# Patient Record
Sex: Female | Born: 1980 | Race: White | Hispanic: No | Smoking: Former smoker
Health system: Southern US, Community
[De-identification: ages and names within clinical notes are randomized; demographics above are authoritative.]

## PROBLEM LIST (undated history)

## (undated) DIAGNOSIS — T7840XA Allergy, unspecified, initial encounter: Secondary | ICD-10-CM

## (undated) DIAGNOSIS — F419 Anxiety disorder, unspecified: Secondary | ICD-10-CM

## (undated) DIAGNOSIS — E039 Hypothyroidism, unspecified: Secondary | ICD-10-CM

## (undated) HISTORY — DX: Allergy, unspecified, initial encounter: T78.40XA

## (undated) HISTORY — DX: Hypothyroidism, unspecified: E03.9

## (undated) HISTORY — PX: CHOLECYSTECTOMY: SHX55

---
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
## 2004-11-01 ENCOUNTER — Inpatient Hospital Stay: Payer: Self-pay

## 2005-08-21 ENCOUNTER — Ambulatory Visit: Payer: Self-pay | Admitting: Surgery

## 2008-07-31 ENCOUNTER — Ambulatory Visit: Payer: Self-pay | Admitting: General Practice

## 2013-09-04 DIAGNOSIS — C4491 Basal cell carcinoma of skin, unspecified: Secondary | ICD-10-CM

## 2013-09-04 HISTORY — DX: Basal cell carcinoma of skin, unspecified: C44.91

## 2014-11-16 DIAGNOSIS — R7989 Other specified abnormal findings of blood chemistry: Secondary | ICD-10-CM | POA: Insufficient documentation

## 2015-06-05 DIAGNOSIS — D229 Melanocytic nevi, unspecified: Secondary | ICD-10-CM

## 2015-06-05 HISTORY — DX: Melanocytic nevi, unspecified: D22.9

## 2015-08-04 HISTORY — PX: AUGMENTATION MAMMAPLASTY: SUR837

## 2016-10-15 ENCOUNTER — Ambulatory Visit: Payer: Self-pay | Admitting: Medical

## 2016-10-15 ENCOUNTER — Encounter: Payer: Self-pay | Admitting: Medical

## 2016-10-15 VITALS — BP 106/72 | HR 88 | Temp 99.7°F | Resp 16 | Ht 59.0 in | Wt 109.0 lb

## 2016-10-15 DIAGNOSIS — J0110 Acute frontal sinusitis, unspecified: Secondary | ICD-10-CM

## 2016-10-15 MED ORDER — AMOXICILLIN-POT CLAVULANATE 875-125 MG PO TABS: 1.0000 | ORAL_TABLET | Freq: Two times a day (BID) | ORAL | 0 refills | Status: DC

## 2016-10-15 NOTE — Patient Instructions (Addendum)
Rest , increase fluids. otc advil take as directed for fever or pain. Return in 3-5 days if not improving. Return for appointment on sleeping trouble and  Smoking cessation counseling.

## 2016-10-15 NOTE — Progress Notes (Signed)
   Subjective:    Patient ID: Holly Pineda, female    DOB: Apr 20, 1981, 36 y.o.   MRN: [id number]  HPI   Started Saturday with headache on forehead, pressure in maxillary area. Fever of  100. 3. Took no antipyrectis today.     Review of Systems  Constitutional: Positive for fatigue and fever. Negative for chills.  HENT: Positive for congestion, ear pain, postnasal drip, sinus pain, sinus pressure, sneezing and sore throat. Negative for nosebleeds and rhinorrhea.   Respiratory: Negative for shortness of breath.   Cardiovascular: Negative for chest pain.       Objective:   Physical Exam  Constitutional: She is oriented to person, place, and time. She appears well-developed and well-nourished.  HENT:  Head: Normocephalic and atraumatic.  Right Ear: A middle ear effusion is present.  Nose: Mucosal edema present.  Mouth/Throat: Uvula is midline, oropharynx is clear and moist and mucous membranes are normal.  Eyes: EOM are normal. Pupils are equal, round, and reactive to light.  Neck: Normal range of motion. Neck supple.  Cardiovascular: Normal rate and regular rhythm.  Exam reveals no gallop and no friction rub.   No murmur heard. Pulmonary/Chest: Effort normal and breath sounds normal.  Musculoskeletal: Normal range of motion.  Lymphadenopathy:    She has no cervical adenopathy.  Neurological: She is alert and oriented to person, place, and time.  Psychiatric: She has a normal mood and affect. Her behavior is normal.  nares erythema bilaterally, green discharge noted on left side.        Assessment & Plan:  Sinusitis Augmentin 875mg  one twice daily x  10 days take with food. otc zyrtec or claritin take as directed. otc advil as needed for pain or fever take as directed. Return to the  3-5 days if not improving. Return appointment for sleeping trouble and smoking cessation counseling. May try otc melatonin 1g to  2 g per night to help with sleeping.

## 2016-10-29 ENCOUNTER — Ambulatory Visit: Payer: Self-pay | Admitting: Medical

## 2016-10-29 ENCOUNTER — Encounter: Payer: Self-pay | Admitting: Medical

## 2016-10-29 VITALS — BP 100/70 | HR 85 | Temp 99.8°F | Resp 16 | Wt 108.0 lb

## 2016-10-29 DIAGNOSIS — J0111 Acute recurrent frontal sinusitis: Secondary | ICD-10-CM

## 2016-10-29 MED ORDER — LEVOFLOXACIN 500 MG PO TABS: 500.0000 mg | ORAL_TABLET | Freq: Every day | ORAL | 0 refills | Status: AC

## 2016-10-29 NOTE — Progress Notes (Signed)
   Subjective:    Patient ID: Holly Pineda, female    DOB: 05/04/1981, 36 y.o.   MRN: [id number]  HPI Started 2 days ago with nasal congestion and forehead headache. Just finished Augmentin  On Monday,  Feels like sinusitis came back. Denies fever. Collecting green mucus in throat.  Initially felt better at the 8-10 day on antibiotics. After finishing antibiotics she began to have symptoms on Tuesday.    Review of Systems  Constitutional: Negative for chills and fever.  HENT: Positive for congestion, sinus pain, sinus pressure, sneezing and sore throat. Negative for ear pain, hearing loss and rhinorrhea.   Eyes: Negative.   Respiratory: Negative.   Cardiovascular: Negative.   Gastrointestinal: Negative.   Endocrine: Negative.   Genitourinary: Negative.   Musculoskeletal: Negative.   Allergic/Immunologic: Negative.   Neurological: Negative.   Hematological: Negative.   Psychiatric/Behavioral: Negative.        Objective:   Physical Exam  Constitutional: She appears well-developed and well-nourished.  HENT:  Head: Normocephalic and atraumatic.  Right Ear: A middle ear effusion is present.  Left Ear: A middle ear effusion is present.  Nose: Mucosal edema present.  Mouth/Throat: Uvula is midline, oropharynx is clear and moist and mucous membranes are normal.  Eyes: EOM are normal. Pupils are equal, round, and reactive to light.  Neck: Normal range of motion. Neck supple.  Cardiovascular: Normal rate, regular rhythm and normal heart sounds.  Exam reveals no gallop and no friction rub.   No murmur heard. Pulmonary/Chest: Effort normal and breath sounds normal.  Nursing note and vitals reviewed.  Nares- swelling of turbinates bilaterally and erythema.       Assessment & Plan:  Sinusitis- levaquin 500mg  one tablet by mouth once a day for ten days no refills. Return to clinic in 12 days for recheck. Recommended otc zyrtec daily and otc flonase daily. Increase water intake.   Take otc Advil 200-4000mg  every 6 hours as needed.  Patient currently on mensis.

## 2016-11-24 ENCOUNTER — Encounter: Payer: Self-pay | Admitting: Medical

## 2016-11-24 ENCOUNTER — Ambulatory Visit: Payer: Self-pay | Admitting: Medical

## 2016-11-24 VITALS — BP 120/65 | HR 102 | Temp 99.1°F | Resp 16 | Ht 59.0 in | Wt 108.0 lb

## 2016-11-24 DIAGNOSIS — G4700 Insomnia, unspecified: Secondary | ICD-10-CM

## 2016-11-24 NOTE — Progress Notes (Signed)
Subjective:    Patient ID: Holly Pineda, female    DOB: 1980-11-29, 36 y.o.   MRN: [id number]  HPI   36 yo female came into today to be checked for her sinuses, wanted to make sure she does not have an infection still. Finished all the Levaquin. "And I feel better. "  Still has post nasal drip "just a little"  Also tried Melatonin started 1 gram and it kept her up , then did  2 mg and still no sleeping.  Says medications work the opposite on her Coffee makes her sleepy and melatonin at  2 mg without  sleepiness. Tried Xanax (given to her by her mother 0.'5mg'$ ) which made her sleepy but then woke her up in the middle of the night and she could not get back to sleep.  Tachycardic in clinic, denies light-headed, dizziness or chest pain or feeling like her heart is racing.Marland Kitchen "I feel fine". ( rechecked hr 110 ir room) she did have two cups of coffee and is a smoker. Patient a little stressed/anxious when discussing about her relationship and HIV testing.     Review of Systems  Constitutional: Negative for chills and fever.  HENT: Negative for postnasal drip.   Eyes: Negative for discharge and itching.  Respiratory: Positive for cough. Negative for shortness of breath.   Cardiovascular: Negative for chest pain, palpitations and leg swelling.  Gastrointestinal: Negative for diarrhea, nausea and vomiting.  Endocrine: Negative for cold intolerance and heat intolerance.  Genitourinary: Negative for dysuria, frequency and urgency.  Musculoskeletal: Negative for back pain and joint swelling.  Skin: Negative for rash and wound.  Neurological: Negative for dizziness, seizures and light-headedness.  Hematological: Negative for adenopathy.  Psychiatric/Behavioral: Negative for confusion and hallucinations.  Cough she says is from post nasl drip. She has not yet tried the zyrtec. Because she is afraid if takes it it may have an opposite affect on her, making her sleepy.  I discussed with her she could  take at night.      Objective:   Physical Exam  Constitutional: She appears well-developed and well-nourished.  HENT:  Head: Normocephalic and atraumatic.  Right Ear: Hearing, tympanic membrane, external ear and ear canal normal.  Left Ear: Hearing, tympanic membrane, external ear and ear canal normal.  Nose: Nose normal.  Mouth/Throat: Uvula is midline, oropharynx is clear and moist and mucous membranes are normal.  Eyes: EOM are normal. Pupils are equal, round, and reactive to light.  Neck: Normal range of motion. Neck supple.  Cardiovascular: Regular rhythm and normal heart sounds.  Tachycardia present.   Lymphadenopathy:    She has no cervical adenopathy.            Assessment & Plan:   Sinusitis resolved. Has appointment for labs on May 2nd for primary care labs (TSH will be checked. Including . CBC/diff, Met C , Uric Acid, and Vitamin D level). She would like HIV and Syphillis and GC/Chlamydia testing done the end of May ( she has her own personal reasons) She is trying to work things out with her partner of  20 years , they are not living together and she is using protection. We discussed that she may say she has a personal issue over the phone because she does not want to call in and  ask for STI testing on the phone. She seems a little uncomfortable sharing this information.( she does have a postivie hx for  HSV infection genital herpes). She is  going to try otc Zyrtec (it may make her sleepy) since many medications work opposite on her.  If she does not try this she may try otc flonase to help with her post nasal drip. And she may also try the  Melatonin at a higher dosage. 89m to 5 mg. I offered her a prescription of sleep medication, but she would like to try the melatonin first.  Tachycardica, she is to contact me or come see me if she has chest pain, dizziness, lightheadedness or palpatations. She is coming in on Wednesday May 2nd for lab work and we will check her vitals  then as well.

## 2016-11-30 ENCOUNTER — Other Ambulatory Visit: Payer: Self-pay

## 2016-12-02 ENCOUNTER — Other Ambulatory Visit: Payer: Self-pay | Admitting: *Deleted

## 2016-12-02 DIAGNOSIS — Z0000 Encounter for general adult medical examination without abnormal findings: Secondary | ICD-10-CM

## 2016-12-02 LAB — POCT URINALYSIS DIPSTICK
Bilirubin, UA: NEGATIVE
Glucose, UA: NEGATIVE
Ketones, UA: NEGATIVE
Leukocytes, UA: NEGATIVE
NITRITE UA: NEGATIVE
PH UA: 6 (ref 5.0–8.0)
PROTEIN UA: NEGATIVE
RBC UA: NEGATIVE
SPEC GRAV UA: 1.02 (ref 1.010–1.025)
UROBILINOGEN UA: 0.2 U/dL

## 2016-12-03 LAB — CMP12+LP+TP+TSH+6AC+CBC/D/PLT
ALK PHOS: 46 IU/L (ref 39–117)
ALT: 12 IU/L (ref 0–32)
AST: 14 IU/L (ref 0–40)
Albumin/Globulin Ratio: 2.1 (ref 1.2–2.2)
Albumin: 4.6 g/dL (ref 3.5–5.5)
BASOS: 1 %
BILIRUBIN TOTAL: 0.2 mg/dL (ref 0.0–1.2)
BUN/Creatinine Ratio: 22 (ref 9–23)
BUN: 15 mg/dL (ref 6–20)
Basophils Absolute: 0 10*3/uL (ref 0.0–0.2)
CHLORIDE: 103 mmol/L (ref 96–106)
CHOL/HDL RATIO: 3.3 ratio (ref 0.0–4.4)
CREATININE: 0.68 mg/dL (ref 0.57–1.00)
Calcium: 9.5 mg/dL (ref 8.7–10.2)
Cholesterol, Total: 194 mg/dL (ref 100–199)
EOS (ABSOLUTE): 0.2 10*3/uL (ref 0.0–0.4)
EOS: 4 %
Free Thyroxine Index: 1.9 (ref 1.2–4.9)
GFR, EST AFRICAN AMERICAN: 129 mL/min/{1.73_m2} (ref >59)
GFR, EST NON AFRICAN AMERICAN: 112 mL/min/{1.73_m2} (ref >59)
GGT: 9 IU/L (ref 0–60)
GLUCOSE: 89 mg/dL (ref 65–99)
Globulin, Total: 2.2 g/dL (ref 1.5–4.5)
HDL: 59 mg/dL (ref >39)
HEMATOCRIT: 39.7 % (ref 34.0–46.6)
HEMOGLOBIN: 13.1 g/dL (ref 11.1–15.9)
IMMATURE GRANULOCYTES: 0 %
Immature Grans (Abs): 0 10*3/uL (ref 0.0–0.1)
Iron: 73 ug/dL (ref 27–159)
LDH: 144 IU/L (ref 119–226)
LDL CALC: 122 mg/dL — AB (ref 0–99)
LYMPHS ABS: 2.1 10*3/uL (ref 0.7–3.1)
Lymphs: 34 %
MCH: 29.2 pg (ref 26.6–33.0)
MCHC: 33 g/dL (ref 31.5–35.7)
MCV: 89 fL (ref 79–97)
Monocytes Absolute: 0.4 10*3/uL (ref 0.1–0.9)
Monocytes: 6 %
Neutrophils Absolute: 3.5 10*3/uL (ref 1.4–7.0)
Neutrophils: 55 %
POTASSIUM: 4.4 mmol/L (ref 3.5–5.2)
Phosphorus: 4.2 mg/dL (ref 2.5–4.5)
Platelets: 245 10*3/uL (ref 150–379)
RBC: 4.48 x10E6/uL (ref 3.77–5.28)
RDW: 13.1 % (ref 12.3–15.4)
Sodium: 141 mmol/L (ref 134–144)
T3 Uptake Ratio: 27 % (ref 24–39)
T4, Total: 7 ug/dL (ref 4.5–12.0)
TOTAL PROTEIN: 6.8 g/dL (ref 6.0–8.5)
TSH: 3.35 u[IU]/mL (ref 0.450–4.500)
Triglycerides: 66 mg/dL (ref 0–149)
URIC ACID: 3.7 mg/dL (ref 2.5–7.1)
VLDL CHOLESTEROL CAL: 13 mg/dL (ref 5–40)
WBC: 6.2 10*3/uL (ref 3.4–10.8)

## 2016-12-03 LAB — VITAMIN D 25 HYDROXY (VIT D DEFICIENCY, FRACTURES): VIT D 25 HYDROXY: 27.5 ng/mL — AB (ref 30.0–100.0)

## 2016-12-08 ENCOUNTER — Ambulatory Visit: Payer: Self-pay | Admitting: Medical

## 2016-12-08 ENCOUNTER — Encounter: Payer: Self-pay | Admitting: Medical

## 2016-12-08 VITALS — BP 110/75 | HR 94 | Temp 98.8°F | Resp 16 | Ht 59.0 in | Wt 107.0 lb

## 2016-12-08 DIAGNOSIS — E559 Vitamin D deficiency, unspecified: Secondary | ICD-10-CM

## 2016-12-08 DIAGNOSIS — Z0000 Encounter for general adult medical examination without abnormal findings: Secondary | ICD-10-CM

## 2016-12-08 NOTE — Progress Notes (Signed)
Subjective:    Patient ID: Holly Pineda, female    DOB: May 24, 1981, 36 y.o.   MRN: [id number]  HPI  36 yo  Here for primary care appointment.  Works in [place] and is married with  2 children Girl  At  54 yo  , boy 72 yo. She has no complaints today.  She sees her OB/GYN yearly for annual female exams. Both her parents are healthy.       Review of Systems  Constitutional: Negative.   HENT: Positive for sinus pain.   Eyes: Negative.   Respiratory: Negative.   Cardiovascular: Negative.   Gastrointestinal: Negative.   Endocrine: Negative.   Genitourinary: Negative.   Musculoskeletal: Negative.   Skin: Negative.   Allergic/Immunologic: Positive for environmental allergies. Negative for food allergies.  Neurological: Positive for headaches.  Hematological: Negative.   Psychiatric/Behavioral: Positive for sleep disturbance.   Neck pain right sided x2 days , thinks she slept wrong and gave her a headache back  of head took nothing for pain. Going to take iubprofen within an hour. Took some yesterday with some relief. Trying melatonin for her sleeping trouble, have reviewed sleep hygiene with patient. No Headache today.    Objective:   Physical Exam  Constitutional: She is oriented to person, place, and time. She appears well-developed and well-nourished.  HENT:  Head: Normocephalic and atraumatic.  Right Ear: Hearing, tympanic membrane, external ear and ear canal normal.  Left Ear: Hearing, tympanic membrane, external ear and ear canal normal.  Nose: Nose normal.  Mouth/Throat: Uvula is midline, oropharynx is clear and moist and mucous membranes are normal.  Eyes: Conjunctivae, EOM and lids are normal. Pupils are equal, round, and reactive to light. No scleral icterus.  Fundoscopic exam:      The right eye shows red reflex.       The left eye shows red reflex.  Neck: Normal range of motion. Neck supple. No thyromegaly present.  Cardiovascular: Normal rate, regular rhythm,  normal heart sounds and intact distal pulses.  Exam reveals no gallop and no friction rub.   No murmur heard. Pulmonary/Chest: Effort normal and breath sounds normal.  Abdominal: Soft. Bowel sounds are normal. She exhibits no mass. There is no hepatosplenomegaly. There is no tenderness.  Musculoskeletal: Normal range of motion.  Lymphadenopathy:       Head (right side): No submental, no submandibular, no tonsillar, no preauricular, no posterior auricular and no occipital adenopathy present.       Head (left side): No submental, no submandibular, no tonsillar, no preauricular, no posterior auricular and no occipital adenopathy present.       Right: No supraclavicular adenopathy present.       Left: No supraclavicular adenopathy present.  Neurological: She is alert and oriented to person, place, and time. She has normal strength and normal reflexes. No cranial nerve deficit or sensory deficit. She displays a negative Romberg sign. GCS eye subscore is 4. GCS verbal subscore is 5. GCS motor subscore is 6.  Reflex Scores:      Brachioradialis reflexes are 2+ on the right side and 2+ on the left side.      Patellar reflexes are 2+ on the right side and 2+ on the left side.      Achilles reflexes are 2+ on the right side and 2+ on the left side. Skin: Skin is warm, dry and intact. No cyanosis. Nails show no clubbing.  Psychiatric: She has a normal mood and affect. Her speech  is normal and behavior is normal. Judgment and thought content normal. Cognition and memory are normal.  Nursing note and vitals reviewed.   Declined breast exam. Has her own OB/GYN that she sees annually.      Assessment & Plan:  Reviewed labs with patient  LDL elevated 122 . Offered  Dietitian but she declined. Vitamin D deficiency to take  Vitamin D3 4000 IU/day with a recheck in one year. For sleeping trouble she prefers to try the melatonin before trying a prescription.  Recommended exercise  3 x / week for at least 30  minutes.  Return to the clinic as needed.

## 2017-03-04 ENCOUNTER — Ambulatory Visit: Payer: Self-pay | Admitting: Adult Health

## 2017-03-04 ENCOUNTER — Encounter: Payer: Self-pay | Admitting: Adult Health

## 2017-03-04 VITALS — BP 102/78 | HR 107 | Temp 98.7°F | Wt 103.0 lb

## 2017-03-04 DIAGNOSIS — Z720 Tobacco use: Secondary | ICD-10-CM

## 2017-03-04 DIAGNOSIS — J0111 Acute recurrent frontal sinusitis: Secondary | ICD-10-CM

## 2017-03-04 DIAGNOSIS — F17200 Nicotine dependence, unspecified, uncomplicated: Secondary | ICD-10-CM

## 2017-03-04 DIAGNOSIS — Z716 Tobacco abuse counseling: Secondary | ICD-10-CM

## 2017-03-04 MED ORDER — AMOXICILLIN-POT CLAVULANATE 875-125 MG PO TABS: 1.0000 | ORAL_TABLET | Freq: Two times a day (BID) | ORAL | 0 refills | Status: AC

## 2017-03-04 NOTE — Patient Instructions (Addendum)
Pseudoephedrine tablets What is this medicine? PSEUDOEPHEDRINE (soo doe e FED rin) is a decongestant. It is used to treat congestion of the nose or sinuses. This medicine may be used for other purposes; ask your health care provider or pharmacist if you have questions. COMMON BRAND NAME(S): Contac Cold 12 Hour, Genaphed, NASAL Decongestant, Nexafed, Pseudo-Time, Sudafed, Sudafed Congestion, Sudogest, Zephrex-D What should I tell my health care provider before I take this medicine? They need to know if you have any of the following conditions: -diabetes -glaucoma -heart disease -high blood pressure -kidney disease -prostate trouble -taken an MAOI like Carbex, Eldepryl, Marplan, Nardil, or Parnate in last 14 days -thyroid disease -trouble passing urine -an unusual or allergic reaction to pseudoephedrine, other medicines, foods, dyes, or preservatives -pregnant or trying to get pregnant -breast-feeding How should I use this medicine? Take this medicine by mouth with a glass of water. Follow the directions on the package or prescription label. Take your medicine at regular intervals. Do not take your medicine more often than directed. Talk to your pediatrician regarding the use of this medicine in children. While this drug may be prescribed for children as young as 38 years of age for selected conditions, precautions do apply. Patients over 37 years old may have a stronger reaction and need a smaller dose. Overdosage: If you think you have taken too much of this medicine contact a poison control center or emergency room at once. NOTE: This medicine is only for you. Do not share this medicine with others. What if I miss a dose? If you miss a dose, take it as soon as you can. If it is almost time for your next dose, take only that dose. Do not take double or extra doses. What may interact with this medicine? Do not take this medicine with any of the following medications: -bromocriptine -ergot  alkaloids like dihydroergotamine, ergonovine, ergotamine, methylergonovine -MAOIs like Carbex, Eldepryl, Marplan, Nardil, and Parnate -stimulant medicines for attention disorders, weight loss, or to stay awake This medicine may also interact with the following medications: -alcohol -atropine -bretylium -caffeine -digoxin -linezolid -mecamylamine -medicines for blood pressure -medicines for depression, anxiety, or psychotic disturbances like fluoxetine, sertraline -medicines for enlarged prostate -medicines for sleep -other medicines for cold, cough, or allergy -procarbazine -reserpine -some heart medicines like metoprolol -St. John's Wort This list may not describe all possible interactions. Give your health care provider a list of all the medicines, herbs, non-prescription drugs, or dietary supplements you use. Also tell them if you smoke, drink alcohol, or use illegal drugs. Some items may interact with your medicine. What should I watch for while using this medicine? Tell your doctor or healthcare professional if your symptoms do not start to get better or if they get worse. See your doctor if you are not better in 7 days or if you have a fever. What side effects may I notice from receiving this medicine? Side effects that you should report to your doctor or health care professional as soon as possible: -allergic reactions like skin rash, itching or hives, swelling of the face, lips, or tongue -bloody diarrhea with stomach pain -breathing problems -chest pain -confused, agitated, nervous -fast, irregular heartbeat -feeling faint or lightheaded, falls -hallucinations -high blood pressure -pain, tingling, numbness in the hands or feet -trouble passing urine or change in the amount of urine -trouble sleeping Side effects that usually do not require medical attention (report to your doctor or health care professional if they continue or are bothersome): -headache -  loss of  appetite -nausea, stomach upset This list may not describe all possible side effects. Call your doctor for medical advice about side effects. You may report side effects to FDA at 1-800-FDA-1088. Where should I keep my medicine? Keep out of the reach of children. This medicine may cause accidental overdose and death if taken by other adults, children, or pets. Mix any unused medicine with a substance like cat littler or coffee grounds. Then throw the medicine away in a sealed container like a sealed bag or a coffee can with a lid. Do not use the medicine after the expiration date. Store at room temperature between 15 and 25 degrees C (59 and 77 degrees F). Protect from heat and moisture. NOTE: This sheet is a summary. It may not cover all possible information. If you have questions about this medicine, talk to your doctor, pharmacist, or health care provider.  2018 Elsevier/Gold Standard (2014-03-24 19:28:08) Sinusitis, Adult Sinusitis is soreness and inflammation of your sinuses. Sinuses are hollow spaces in the bones around your face. They are located: Around your eyes. In the middle of your forehead. Behind your nose. In your cheekbones.  Your sinuses and nasal passages are lined with a stringy fluid (mucus). Mucus normally drains out of your sinuses. When your nasal tissues get inflamed or swollen, the mucus can get trapped or blocked so air cannot flow through your sinuses. This lets bacteria, viruses, and funguses grow, and that leads to infection. Follow these instructions at home: Medicines Take, use, or apply over-the-counter and prescription medicines only as told by your doctor. These may include nasal sprays. If you were prescribed an antibiotic medicine, take it as told by your doctor. Do not stop taking the antibiotic even if you start to feel better. Hydrate and Humidify Drink enough water to keep your pee (urine) clear or pale yellow. Use a cool mist humidifier to keep the  humidity level in your home above 50%. Breathe in steam for 10-15 minutes, 3-4 times a day or as told by your doctor. You can do this in the bathroom while a hot shower is running. Try not to spend time in cool or dry air. Rest Rest as much as possible. Sleep with your head raised (elevated). Make sure to get enough sleep each night. General instructions Put a warm, moist washcloth on your face 3-4 times a day or as told by your doctor. This will help with discomfort. Wash your hands often with soap and water. If there is no soap and water, use hand sanitizer. Do not smoke. Avoid being around people who are smoking (secondhand smoke). Keep all follow-up visits as told by your doctor. This is important. Contact a doctor if: You have a fever. Your symptoms get worse. Your symptoms do not get better within 10 days. Get help right away if: You have a very bad headache. You cannot stop throwing up (vomiting). You have pain or swelling around your face or eyes. You have trouble seeing. You feel confused. Your neck is stiff. You have trouble breathing. This information is not intended to replace advice given to you by your health care provider. Make sure you discuss any questions you have with your health care provider. Document Released: 01/06/2008 Document Revised: 03/15/2016 Document Reviewed: 05/15/2015 Elsevier Interactive Patient Education  2018 [employer]. Nicotine skin patches What is this medicine? NICOTINE ([place] teen) helps people stop smoking. The patches replace the nicotine found in cigarettes and help to decrease withdrawal effects. They  are most effective when used in combination with a stop-smoking program. This medicine may be used for other purposes; ask your health care provider or pharmacist if you have questions. COMMON BRAND NAME(S): Habitrol, Nicoderm CQ, Nicotrol What should I tell my health care provider before I take this medicine? They need to know if you  have any of these conditions: -diabetes -heart disease, angina, irregular heartbeat or previous heart attack -high blood pressure -lung disease, including asthma -overactive thyroid -pheochromocytoma -seizures or a history of seizures -skin problems, like eczema -stomach problems or ulcers -an unusual or allergic reaction to nicotine, adhesives, other medicines, foods, dyes, or preservatives -pregnant or trying to get pregnant -breast-feeding How should I use this medicine? This medicine is for use on the skin. Follow the directions that come with the patches. Find an area of skin on your upper arm, chest, or back that is clean, dry, greaseless, undamaged and hairless. Wash hands with plain soap and water. Do not use anything that contains aloe, lanolin or glycerin as these may prevent the patch from sticking. Dry thoroughly. Remove the patch from the sealed pouch. Do not try to cut or trim the patch. Using your palm, press the patch firmly in place for 10 seconds to make sure that there is good contact with your skin. After applying the patch, wash your hands. Change the patch every day, keeping to a regular schedule. When you apply a new patch, use a new area of skin. Wait at least 1 week before using the same area again. Talk to your pediatrician regarding the use of this medicine in children. Special care may be needed. Overdosage: If you think you have taken too much of this medicine contact a poison control center or emergency room at once. NOTE: This medicine is only for you. Do not share this medicine with others. What if I miss a dose? If you forget to replace a patch, use it as soon as you can. Only use one patch at a time and do not leave on the skin for longer than directed. If a patch falls off, you can replace it, but keep to your schedule and remove the patch at the right time. What may interact with this medicine? -medicines for asthma -medicines for blood pressure -medicines  for mental depression This list may not describe all possible interactions. Give your health care provider a list of all the medicines, herbs, non-prescription drugs, or dietary supplements you use. Also tell them if you smoke, drink alcohol, or use illegal drugs. Some items may interact with your medicine. What should I watch for while using this medicine? You should begin using the nicotine patch the day you stop smoking. It is okay if you do not succeed at your attempt to quit and have a cigarette. You can still continue your quit attempt and keep using the product as directed. Just throw away your cigarettes and get back to your quit plan. You can keep the patch in place during swimming, bathing, and showering. If your patch falls off during these activities, replace it. When you first apply the patch, your skin may itch or burn. This should go away soon. When you remove a patch, the skin may look red, but this should only last for a few days. Call your doctor or health care professional if skin redness does not go away after 4 days, if your skin swells, or if you get a rash. If you are a diabetic and you quit smoking,  the effects of insulin may be increased and you may need to reduce your insulin dose. Check with your doctor or health care professional about how you should adjust your insulin dose. If you are going to have a magnetic resonance imaging (MRI) procedure, tell your MRI technician if you have this patch on your body. It must be removed before a MRI. What side effects may I notice from receiving this medicine? Side effects that you should report to your doctor or health care professional as soon as possible: -allergic reactions like skin rash, itching or hives, swelling of the face, lips, or tongue -breathing problems -changes in hearing -changes in vision -chest pain -cold sweats -confusion -fast, irregular heartbeat -feeling faint or lightheaded, falls -headache -increased  saliva -skin redness that lasts more than 4 days -stomach pain -signs and symptoms of nicotine overdose like nausea; vomiting; dizziness; weakness; and rapid heartbeat Side effects that usually do not require medical attention (report to your doctor or health care professional if they continue or are bothersome): -diarrhea -dry mouth -hiccups -irritability -nervousness or restlessness -trouble sleeping or vivid dreams This list may not describe all possible side effects. Call your doctor for medical advice about side effects. You may report side effects to FDA at 1-800-FDA-1088. Where should I keep my medicine? Keep out of the reach of children. Store at room temperature between 20 and 25 degrees C (68 and 77 degrees F). Protect from heat and light. Store in International aid/development worker until ready to use. Throw away unused medicine after the expiration date. When you remove a patch, fold with sticky sides together; put in an empty opened pouch and throw away. NOTE: This sheet is a summary. It may not cover all possible information. If you have questions about this medicine, talk to your doctor, pharmacist, or health care provider.  2018 Elsevier/Gold Standard (2014-06-18 15:46:21) Smoking Tobacco Information Smoking tobacco will very likely harm your health. Tobacco contains a poisonous (toxic), colorless chemical called nicotine. Nicotine affects the brain and makes tobacco addictive. This change in your brain can make it hard to stop smoking. Tobacco also has other toxic chemicals that can hurt your body and raise your risk of many cancers. How can smoking tobacco affect me? Smoking tobacco can increase your chances of having serious health conditions, such as:  Cancer. Smoking is most commonly associated with lung cancer, but can lead to cancer in other parts of the body.  Chronic obstructive pulmonary disease (COPD). This is a long-term lung condition that makes it hard to breathe. It also  gets worse over time.  High blood pressure (hypertension), heart disease, stroke, or heart attack.  Lung infections, such as pneumonia.  Cataracts. This is when the lenses in the eyes become clouded.  Digestive problems. This may include peptic ulcers, heartburn, and gastroesophageal reflux disease (GERD).  Oral health problems, such as gum disease and tooth loss.  Loss of taste and smell.  Smoking can affect your appearance by causing:  Wrinkles.  Yellow or stained teeth, fingers, and fingernails.  Smoking tobacco can also affect your social life.  Many workplaces, [employer], hotels, and public places are tobacco-free. This means that you may experience challenges in finding places to smoke when away from home.  The cost of a smoking habit can be expensive. Expenses for someone who smokes come in two ways: ? You spend money on a regular basis to buy tobacco. ? Your health care costs in the long-term are higher if you smoke.  Tobacco smoke can also affect the health of those around you. Children of smokers have greater chances of: ? Sudden infant death syndrome (SIDS). ? Ear infections. ? Lung infections.  What lifestyle changes can be made?  Do not start smoking. Quit if you already do.  To quit smoking: ? Make a plan to quit smoking and commit yourself to it. Look for programs to help you and ask your health care provider for recommendations and ideas. ? Talk with your health care provider about using nicotine replacement medicines to help you quit. Medicine replacement medicines include gum, lozenges, patches, sprays, or pills. ? Do not replace cigarette smoking with electronic cigarettes, which are commonly called e-cigarettes. The safety of e-cigarettes is not known, and some may contain harmful chemicals. ? Avoid places, people, or situations that tempt you to smoke. ? If you try to quit but return to smoking, don't give up hope. It is very common for people to try a  number of times before they fully succeed. When you feel ready again, give it another try.  Quitting smoking might affect the way you eat as well as your weight. Be prepared to monitor your eating habits. Get support in planning and following a healthy diet.  Ask your health care provider about having regular tests (screenings) to check for cancer. This may include blood tests, imaging tests, and other tests.  Exercise regularly. Consider taking walks, joining a gym, or doing yoga or exercise classes.  Develop skills to manage your stress. These skills include meditation. What are the benefits of quitting smoking? By quitting smoking, you may:  Lower your risk of getting cancer and other diseases caused by smoking.  Live longer.  Breathe better.  Lower your blood pressure and heart rate.  Stop your addiction to tobacco.  Stop creating secondhand smoke that hurts other people.  Improve your sense of taste and smell.  Look better over time, due to having fewer wrinkles and less staining.  What can happen if changes are not made? If you do not stop smoking, you may:  Get cancer and other diseases.  Develop COPD or other long-term (chronic) lung conditions.  Develop serious problems with your heart and blood vessels (cardiovascular system).  Need more tests to screen for problems caused by smoking.  Have higher, long-term healthcare costs from medicines or treatments related to smoking.  Continue to have worsening changes in your lungs, mouth, and nose.  Where to find support: To get support to quit smoking, consider:  Asking your health care provider for more information and resources.  Taking classes to learn more about quitting smoking.  Looking for local organizations that offer resources about quitting smoking.  Joining a support group for people who want to quit smoking in your local community.  Where to find more information: You may find more information  about quitting smoking from:  HelpGuide.org: www.helpguide.org/articles/addictions/how-to-quit-smoking.htm  https://hall.com/: smokefree.gov  American Lung Association: www.lung.org  Contact a health care provider if:  You have problems breathing.  Your lips, nose, or fingers turn blue.  You have chest pain.  You are coughing up blood.  You feel faint or you pass out.  You have other noticeable changes that cause you to worry. Summary  Smoking tobacco can negatively affect your health, the health of those around you, your finances, and your social life.  Do not start smoking. Quit if you already do. If you need help quitting, ask your health care provider.  Think about joining  a support group for people who want to quit smoking in your local community. There are many effective programs that will help you to quit this behavior. This information is not intended to replace advice given to you by your health care provider. Make sure you discuss any questions you have with your health care provider. Document Released: 08/04/2016 Document Revised: 08/04/2016 Document Reviewed: 08/04/2016 Elsevier Interactive Patient Education  [employer].

## 2017-03-04 NOTE — Progress Notes (Signed)
Subjective:     Patient ID: Holly Pineda, female   DOB: Apr 07, 1981, 36 y.o.   MRN: [id number]  HPI   Patient is a 36 year old female in no acute distress who presents with a chief complaint of sinus pressure and headache.  She reports she has had a " sinus " type headache and " frontal pressure". She just returned from the beach. Sudafed improves some. She is taking Zyrtec everyday. She denies fever, chills, nausea, or vomiting.  She reports history of two recent sinus infections, treated with 3/15 with Augmentin  and 3/29 with Levaquin. She reports symptoms resolved completely after Levaquin and denies any other infections or illness since then.  Patient also wants to discuss stopping smoking. She reports she is ready to quit smoking. She denies any  history of depression or anxiety in the past or currently.  She reports she is smoking one pack per day. She denies any history of behavorial pr emotional changes. She denies any history of suicidal or ideations.  Blood pressure 102/78, pulse (!) 107, temperature 98.7 F (37.1 C), temperature source Tympanic, weight 103 lb (46.7 kg), last menstrual period 02/21/2017, SpO2 99 %. Heart rate recheck 97 beats per minute, she reports she had just smoked before walking in to clinic.    Current Outpatient Prescriptions:  .  Adapalene-Benzoyl Peroxide 0.1-2.5 % gel, APPLY ON THE SKIN DAILY AT BEDTIME TO FACE FOR ACNE, Disp: , Rfl: 0 .  cetirizine (ZYRTEC) 10 MG tablet, Take 10 mg by mouth daily., Disp: , Rfl:    Patient Active Problem List   Diagnosis Date Noted  . Low vitamin D level 11/16/2014     Review of Systems  Constitutional: Negative for activity change, appetite change, chills, diaphoresis, fatigue, fever and unexpected weight change.  HENT: Positive for congestion, postnasal drip, sinus pressure and sneezing. Negative for ear discharge, ear pain, facial swelling, hearing loss, mouth sores, nosebleeds, rhinorrhea, sinus pain, sore  throat, tinnitus, trouble swallowing and voice change.   Eyes: Negative for photophobia, pain, discharge, redness, itching and visual disturbance.  Respiratory: Negative for apnea, cough, choking, chest tightness, shortness of breath, wheezing and stridor.   Cardiovascular: Negative for chest pain, palpitations and leg swelling.  Gastrointestinal: Negative.   Endocrine: Negative.   Genitourinary: Negative.   Musculoskeletal: Negative.   Skin: Negative.   Allergic/Immunologic: Negative for environmental allergies, food allergies and immunocompromised state.  Neurological: Positive for headaches (intermittent started last week " sinus Like ' She was visiting beach.). Negative for dizziness, facial asymmetry and light-headedness.  Hematological: Negative for adenopathy. Does not bruise/bleed easily.  Psychiatric/Behavioral: Negative.  Negative for agitation, behavioral problems, confusion, decreased concentration, dysphoric mood, hallucinations, self-injury, sleep disturbance and suicidal ideas. The patient is not nervous/anxious and is not hyperactive.        Objective:   Physical Exam  Constitutional: She is oriented to person, place, and time. She appears well-developed and well-nourished. She is active.  HENT:  Head: Normocephalic and atraumatic.  Right Ear: Hearing, external ear and ear canal normal. Tympanic membrane is not perforated, not erythematous and not bulging. A middle ear effusion is present. No decreased hearing is noted.  Left Ear: Hearing, external ear and ear canal normal. Tympanic membrane is not perforated, not erythematous and not bulging. A middle ear effusion (mild to moderate ) is present. No decreased hearing is noted.  Nose: Mucosal edema and rhinorrhea present. No epistaxis. Right sinus exhibits frontal sinus tenderness. Right sinus exhibits no maxillary  sinus tenderness. Left sinus exhibits frontal sinus tenderness. Left sinus exhibits no maxillary sinus tenderness.   Eyes: Pupils are equal, round, and reactive to light. Conjunctivae, EOM and lids are normal.  Neck: Trachea normal, normal range of motion and full passive range of motion without pain. Neck supple. No JVD present. Carotid bruit is not present.  Cardiovascular: Normal rate, regular rhythm, normal heart sounds and intact distal pulses.  Exam reveals no gallop and no friction rub.   No murmur heard. Pulmonary/Chest: Effort normal and breath sounds normal. No respiratory distress. She has no wheezes. She has no rales. She exhibits no tenderness.  Abdominal: Soft. She exhibits no distension. There is no tenderness.  Musculoskeletal: Normal range of motion.  Neurological: She is alert and oriented to person, place, and time.  Skin: Skin is warm. No rash noted. She is not diaphoretic. No erythema. No pallor.  Psychiatric: She has a normal mood and affect. Her behavior is normal. Judgment and thought content normal.       Assessment:    1. Sinusitis     2. Smoking Cessation     Plan:     1. Will treat Sinusitis with Augmentin. Take sudafed for three days as directed on package. Continue Zyrtec as on label daily. Return to clinic if no improvement of symptoms within 72 hours or if symptoms change or worsen. Go to Urgent Care or Emergency room if after hours and symptoms worsen or change at any time. Patient verbalized understanding. Will refer to [facility] if infection returns after this round of antibiotics. Use nasal saline rinse as per package instructions morning and night. Shower before bed. Patient verbalized understanding.  2. Offered Smoking Cessation counseling at [facility], Patient declines those services at this time and will call if she desires. She prefers to try over the counter Nicotine patches as directed on package instructions and will also read side effects and contraindications. She will start these packages once she finishes Sudafed for sinusitis. Discussed risks versus benefits. She will  return to the office in two weeks for recheck or sooner if any changes in her current status.she is to go to the emergency room or urgent care if after hours and any new symptoms or side effects occur.  Discussed current plan with Dr. Thurston Hole and if patches fail, may try on Wellbutrin as patient prefers over Chantix at this time.   Patient verbalized understanding of all instructions and will schedule a two week follow up appointment with office and sooner if any changes. Patient verbalized understanding.   E- Prescribed  As below Meds ordered this encounter  Medications  . amoxicillin-clavulanate (AUGMENTIN) 875-125 MG tablet    Sig: Take 1 tablet by mouth 2 (two) times daily.    Dispense:  20 tablet    Refill:  0

## 2017-04-19 ENCOUNTER — Encounter: Payer: Self-pay | Admitting: Medical

## 2017-04-19 ENCOUNTER — Ambulatory Visit: Payer: Self-pay | Admitting: Medical

## 2017-04-19 VITALS — BP 102/70 | HR 107 | Temp 99.2°F | Resp 16 | Wt 102.0 lb

## 2017-04-19 DIAGNOSIS — J0110 Acute frontal sinusitis, unspecified: Secondary | ICD-10-CM

## 2017-04-19 DIAGNOSIS — Z113 Encounter for screening for infections with a predominantly sexual mode of transmission: Secondary | ICD-10-CM

## 2017-04-19 NOTE — Progress Notes (Signed)
   Subjective:    Patient ID: Holly Pineda, female    DOB: 08/13/1980, 36 y.o.   MRN: [id number]  HPI 36yo female comes in today with nasal congestion. With Frontal headache thorough out the weekend. Needing to take Advil all weekend long. None today. Feels better today.. Taking zyrtec daily. Seen on  03/04/17 and was prescribed Augmentin which she says she has held off on taking it because she thought it would get better on its own, but it has not. Says she is in less pain today then she was in August. Did use the nasal spray which seemed to help.  Not ready to quite smoking at this time. Says the patches are expensive. Not sure if she is going to try the patches just yet. Undecided.  Trying to work things out with her husband ( he has cheated on her with other women), because she does not trust him she uses condoms, this weekend he wanted to have sex and she thought he had a condom on but he did not, she immediately stopped having sex with him ( he did not ejaculate) and is worried about potential STDs. She states she does not know if or  Whom he is cheating with. Patient says she cried all weekend worried she may have a Sexually transmitted disease. And she would like to be tested.   Review of Systems  Constitutional: Negative for chills and fever.  HENT: Positive for congestion, sinus pain and sinus pressure. Negative for ear pain, facial swelling, nosebleeds, postnasal drip, rhinorrhea, sneezing, sore throat, tinnitus and trouble swallowing.   Eyes: Negative for discharge and itching.  Respiratory: Negative for cough and shortness of breath.   Cardiovascular: Negative for chest pain and leg swelling.  Gastrointestinal: Negative for abdominal pain.  Endocrine: Negative for polydipsia, polyphagia and polyuria.  Genitourinary: Negative for dysuria.  Musculoskeletal: Negative for gait problem and myalgias.  Skin: Negative for rash.  Allergic/Immunologic: Positive for environmental allergies.  Negative for food allergies.  Hematological: Negative for adenopathy.  Psychiatric/Behavioral: Negative for behavioral problems, confusion and suicidal ideas. The patient is not nervous/anxious.        Objective:   Physical Exam  Constitutional: She appears well-developed and well-nourished.  HENT:  Head: Normocephalic and atraumatic.  Eyes: Pupils are equal, round, and reactive to light. Conjunctivae and EOM are normal.  Neck: Normal range of motion. Neck supple.  Cardiovascular: Normal rate, regular rhythm and normal heart sounds.  Exam reveals no gallop and no friction rub.   No murmur heard. Pulmonary/Chest: Effort normal and breath sounds normal.  Neurological: She is alert.  Skin: Skin is warm and dry.  Psychiatric: She has a normal mood and affect. Her behavior is normal. Judgment and thought content normal.  Nursing note and vitals reviewed.         Assessment & Plan:  STD  Screening Sinusitis patient has prescription she never took from last visit of Augmentin 875 mg one by mouth twice daily #20 no refills. Last tested for STDs 12/2014 for  HIV, RPR GC/ Chlamydia in [place]. Will get baseline and retest  6 months. Given [employer]. For her anxiety.  However patient insistent on getting tested in 3 months, reviewed with Dr. Rosanna Randy. He is okay with testing in the  3 month window. Return to the clinic as needed. Patient verbalizes understanding and has no questions as discharge.

## 2017-04-19 NOTE — Patient Instructions (Signed)

## 2017-04-20 LAB — GC/CHLAMYDIA PROBE AMP
Chlamydia trachomatis, NAA: NEGATIVE
NEISSERIA GONORRHOEAE BY PCR: NEGATIVE

## 2017-04-20 LAB — HIV ANTIBODY (ROUTINE TESTING W REFLEX): HIV SCREEN 4TH GENERATION: NONREACTIVE

## 2017-04-20 LAB — RPR QUALITATIVE: RPR: NONREACTIVE

## 2017-04-26 ENCOUNTER — Telehealth: Payer: Self-pay | Admitting: Medical

## 2017-04-26 NOTE — Telephone Encounter (Signed)
Called patient  04/22/17 with STD testing results.  HIV, RPR, GC and Chlamydia all negative.  She wants retesting in 3 months, reviewed with Dr. Rosanna Randy.  Also renforced counseling for patient anxiety. Referred patient to [employer] resources again.  She did communicate to me that in general she uses condom protection 100% of the time.

## 2018-04-29 ENCOUNTER — Encounter: Payer: Self-pay | Admitting: Adult Health

## 2018-04-29 ENCOUNTER — Ambulatory Visit: Payer: Self-pay | Admitting: Adult Health

## 2018-04-29 VITALS — BP 120/78 | HR 98 | Temp 99.7°F | Resp 16 | Wt 99.4 lb

## 2018-04-29 DIAGNOSIS — H6983 Other specified disorders of Eustachian tube, bilateral: Secondary | ICD-10-CM

## 2018-04-29 DIAGNOSIS — Z889 Allergy status to unspecified drugs, medicaments and biological substances status: Secondary | ICD-10-CM

## 2018-04-29 MED ORDER — PREDNISONE 10 MG (21) PO TBPK: ORAL_TABLET | ORAL | 0 refills | Status: DC

## 2018-04-29 MED ORDER — CETIRIZINE HCL 10 MG PO TABS: 10.0000 mg | ORAL_TABLET | Freq: Every day | ORAL | 0 refills | Status: DC

## 2018-04-29 NOTE — Patient Instructions (Addendum)
Eustachian Tube Dysfunction The eustachian tube connects the middle ear to the back of the nose. It regulates air pressure in the middle ear by allowing air to move between the ear and nose. It also helps to drain fluid from the middle ear space. When the eustachian tube does not function properly, air pressure, fluid, or both can build up in the middle ear. Eustachian tube dysfunction can affect one or both ears. What are the causes? This condition happens when the eustachian tube becomes blocked or cannot open normally. This may result from:  Ear infections.  Colds and other upper respiratory infections.  Allergies.  Irritation, such as from cigarette smoke or acid from the stomach coming up into the esophagus (gastroesophageal reflux).  Sudden changes in air pressure, such as from descending in an airplane.  Abnormal growths in the nose or throat, such as nasal polyps, tumors, or enlarged tissue at the back of the throat (adenoids).  What increases the risk? This condition may be more likely to develop in people who smoke and people who are overweight. Eustachian tube dysfunction may also be more likely to develop in children, especially children who have:  Certain birth defects of the mouth, such as cleft palate.  Large tonsils and adenoids.  What are the signs or symptoms? Symptoms of this condition may include:  A feeling of fullness in the ear.  Ear pain.  Clicking or popping noises in the ear.  Ringing in the ear.  Hearing loss.  Loss of balance.  Symptoms may get worse when the air pressure around you changes, such as when you travel to an area of high elevation or fly on an airplane. How is this diagnosed? This condition may be diagnosed based on:  Your symptoms.  A physical exam of your ear, nose, and throat.  Tests, such as those that measure: ? The movement of your eardrum (tympanogram). ? Your hearing (audiometry).  How is this treated? Treatment  depends on the cause and severity of your condition. If your symptoms are mild, you may be able to relieve your symptoms by moving air into ("popping") your ears. If you have symptoms of fluid in your ears, treatment may include:  Decongestants.  Antihistamines.  Nasal sprays or ear drops that contain medicines that reduce swelling (steroids).  In some cases, you may need to have a procedure to drain the fluid in your eardrum (myringotomy). In this procedure, a small tube is placed in the eardrum to:  Drain the fluid.  Restore the air in the middle ear space.  Follow these instructions at home:  Take over-the-counter and prescription medicines only as told by your health care provider.  Use techniques to help pop your ears as recommended by your health care provider. These may include: ? Chewing gum. ? Yawning. ? Frequent, forceful swallowing. ? Closing your mouth, holding your nose closed, and gently blowing as if you are trying to blow air out of your nose.  Do not do any of the following until your health care provider approves: ? Travel to high altitudes. ? Fly in airplanes. ? Work in a pressurized cabin or room. ? Scuba dive.  Keep your ears dry. Dry your ears completely after showering or bathing.  Do not smoke.  Keep all follow-up visits as told by your health care provider. This is important. Contact a health care provider if:  Your symptoms do not go away after treatment.  Your symptoms come back after treatment.  You are   unable to pop your ears.  You have: ? A fever. ? Pain in your ear. ? Pain in your head or neck. ? Fluid draining from your ear.  Your hearing suddenly changes.  You become very dizzy.  You lose your balance. This information is not intended to replace advice given to you by your health care provider. Make sure you discuss any questions you have with your health care provider. Document Released: 08/16/2015 Document Revised: 12/26/2015  Document Reviewed: 08/08/2014 Elsevier Interactive Patient Education  2018 [employer]. Allergies An allergy is when your body reacts to a substance in a way that is not normal. An allergic reaction can happen after you:  Eat something.  Breathe in something.  Touch something.  You can be allergic to:  Things that are only around during certain seasons, like molds and pollens.  Foods.  Drugs.  Insects.  Animal dander.  What are the signs or symptoms?  Puffiness (swelling). This may happen on the lips, face, tongue, mouth, or throat.  Sneezing.  Coughing.  Breathing loudly (wheezing).  Stuffy nose.  Tingling in the mouth.  A rash.  Itching.  Itchy, red, puffy areas of skin (hives).  Watery eyes.  Throwing up (vomiting).  Watery poop (diarrhea).  Dizziness.  Feeling faint or fainting.  Trouble breathing or swallowing.  A tight feeling in the chest.  A fast heartbeat. How is this diagnosed? Allergies can be diagnosed with:  A medical and family history.  Skin tests.  Blood tests.  A food diary. A food diary is a record of all the foods, drinks, and symptoms you have each day.  The results of an elimination diet. This diet involves making sure not to eat certain foods and then seeing what happens when you start eating them again.  How is this treated? There is no cure for allergies, but allergic reactions can be treated with medicine. Severe reactions usually need to be treated at a hospital. How is this prevented? The best way to prevent an allergic reaction is to avoid the thing you are allergic to. Allergy shots and medicines can also help prevent reactions in some cases. This information is not intended to replace advice given to you by your health care provider. Make sure you discuss any questions you have with your health care provider. Document Released: 11/14/2012 Document Revised: 03/16/2016 Document Reviewed: 05/01/2014 Elsevier  Interactive Patient Education  2018 [employer]. Prednisone tablets What is this medicine? PREDNISONE (PRED ni sone) is a corticosteroid. It is commonly used to treat inflammation of the skin, joints, lungs, and other organs. Common conditions treated include asthma, allergies, and arthritis. It is also used for other conditions, such as blood disorders and diseases of the adrenal glands. This medicine may be used for other purposes; ask your health care provider or pharmacist if you have questions. COMMON BRAND NAME(S): Deltasone, Predone, Sterapred, Sterapred DS What should I tell my health care provider before I take this medicine? They need to know if you have any of these conditions: -Cushing's syndrome -diabetes -glaucoma -heart disease -high blood pressure -infection (especially a virus infection such as chickenpox, cold sores, or herpes) -kidney disease -liver disease -mental illness -myasthenia gravis -osteoporosis -seizures -stomach or intestine problems -thyroid disease -an unusual or allergic reaction to lactose, prednisone, other medicines, foods, dyes, or preservatives -pregnant or trying to get pregnant -breast-feeding How should I use this medicine? Take this medicine by mouth with a glass of water. Follow the directions on the  prescription label. Take this medicine with food. If you are taking this medicine once a day, take it in the morning. Do not take more medicine than you are told to take. Do not suddenly stop taking your medicine because you may develop a severe reaction. Your doctor will tell you how much medicine to take. If your doctor wants you to stop the medicine, the dose may be slowly lowered over time to avoid any side effects. Talk to your pediatrician regarding the use of this medicine in children. Special care may be needed. Overdosage: If you think you have taken too much of this medicine contact a poison control center or emergency room at  once. NOTE: This medicine is only for you. Do not share this medicine with others. What if I miss a dose? If you miss a dose, take it as soon as you can. If it is almost time for your next dose, talk to your doctor or health care professional. You may need to miss a dose or take an extra dose. Do not take double or extra doses without advice. What may interact with this medicine? Do not take this medicine with any of the following medications: -metyrapone -mifepristone This medicine may also interact with the following medications: -aminoglutethimide -amphotericin B -aspirin and aspirin-like medicines -barbiturates -certain medicines for diabetes, like glipizide or glyburide -cholestyramine -cholinesterase inhibitors -cyclosporine -digoxin -diuretics -ephedrine -female hormones, like estrogens and birth control pills -isoniazid -ketoconazole -NSAIDS, medicines for pain and inflammation, like ibuprofen or naproxen -phenytoin -rifampin -toxoids -vaccines -warfarin This list may not describe all possible interactions. Give your health care provider a list of all the medicines, herbs, non-prescription drugs, or dietary supplements you use. Also tell them if you smoke, drink alcohol, or use illegal drugs. Some items may interact with your medicine. What should I watch for while using this medicine? Visit your doctor or health care professional for regular checks on your progress. If you are taking this medicine over a prolonged period, carry an identification card with your name and address, the type and dose of your medicine, and your doctor's name and address. This medicine may increase your risk of getting an infection. Tell your doctor or health care professional if you are around anyone with measles or chickenpox, or if you develop sores or blisters that do not heal properly. If you are going to have surgery, tell your doctor or health care professional that you have taken this  medicine within the last twelve months. Ask your doctor or health care professional about your diet. You may need to lower the amount of salt you eat. This medicine may affect blood sugar levels. If you have diabetes, check with your doctor or health care professional before you change your diet or the dose of your diabetic medicine. What side effects may I notice from receiving this medicine? Side effects that you should report to your doctor or health care professional as soon as possible: -allergic reactions like skin rash, itching or hives, swelling of the face, lips, or tongue -changes in emotions or moods -changes in vision -depressed mood -eye pain -fever or chills, cough, sore throat, pain or difficulty passing urine -increased thirst -swelling of ankles, feet Side effects that usually do not require medical attention (report to your doctor or health care professional if they continue or are bothersome): -confusion, excitement, restlessness -headache -nausea, vomiting -skin problems, acne, thin and shiny skin -trouble sleeping -weight gain This list may not describe all possible side effects.  Call your doctor for medical advice about side effects. You may report side effects to FDA at 1-800-FDA-1088. Where should I keep my medicine? Keep out of the reach of children. Store at room temperature between 15 and 30 degrees C (59 and 86 degrees F). Protect from light. Keep container tightly closed. Throw away any unused medicine after the expiration date. NOTE: This sheet is a summary. It may not cover all possible information. If you have questions about this medicine, talk to your doctor, pharmacist, or health care provider.  2018 Elsevier/Gold Standard (2011-03-05 10:57:14)

## 2018-04-29 NOTE — Progress Notes (Signed)
Subjective:     Patient ID: Holly Pineda, female   DOB: [birth year], 37 y.o.   MRN: [id number]  HPI   Blood pressure 120/78, pulse 98, temperature 99.7 F (37.6 C), temperature source Tympanic, resp. rate 16, weight 99 lb 6.4 oz (45.1 kg), last menstrual period 04/14/2018, SpO2 100 %.  Patient is a 37 year old female in no acute distress who comes to the clinic for left ear pressure. She reports this is intermittent. Nasal drainage - clear.  She has not been taking Zyrtec every night. She has been using lavender oil to sleep. She reports at night she takes 1/2 of her moms Xanax at times for sleep. She reports she continues to have a popping sound in her left ear with moving her head from side to side. She has not been using Flonase. She has been using nasal rinse.   Denies any neck pain or injury.  Patient  denies any fever, body aches,chills, rash, chest pain, shortness of breath, nausea, vomiting, or diarrhea.   She reports Dr. Tami Ribas recommended CT of sinuses in February 2019. She did not do because of cost she reports.   Denies any chance of pregnancy.  No Known Allergies      Review of Systems  Constitutional: Negative.   HENT: Positive for congestion and postnasal drip.        Ear pressure   Respiratory: Negative.   Cardiovascular: Negative.   Gastrointestinal: Negative.   Genitourinary: Negative.   Musculoskeletal: Negative.   Skin: Negative.   Hematological: Negative.   Psychiatric/Behavioral: Negative.        Objective:   Physical Exam  Constitutional: She is oriented to person, place, and time. She appears well-developed and well-nourished. She is active.  Non-toxic appearance. She does not have a sickly appearance. She does not appear ill. No distress.   Cobblestoning posterior pharynx; bilateral allergic shiners; bilateral TMs air fluid level clear; bilateral nasal turbinates mild edema erythema clear discharge;     HENT:  Head: Normocephalic and  atraumatic.  Right Ear: Hearing normal. No tenderness. Tympanic membrane is not perforated and not erythematous. A middle ear effusion is present.  Left Ear: Hearing normal. No tenderness. Tympanic membrane is not perforated and not erythematous. A middle ear effusion is present.  Nose: Mucosal edema and rhinorrhea present. Right sinus exhibits no maxillary sinus tenderness and no frontal sinus tenderness. Left sinus exhibits no maxillary sinus tenderness and no frontal sinus tenderness.  Mouth/Throat: Uvula is midline, oropharynx is clear and moist and mucous membranes are normal. Tonsils are 0 on the right. Tonsils are 0 on the left. No tonsillar exudate.  Eyes: Pupils are equal, round, and reactive to light. Conjunctivae, EOM and lids are normal.  Neck: Trachea normal, normal range of motion, full passive range of motion without pain and phonation normal. Neck supple. No Brudzinski's sign noted.  Cardiovascular: Normal rate, regular rhythm, normal heart sounds and intact distal pulses. Exam reveals no gallop and no friction rub.  No murmur heard. Pulmonary/Chest: Effort normal and breath sounds normal. No stridor. No respiratory distress. She has no wheezes. She has no rales. She exhibits no tenderness.  Lymphadenopathy:       Head (right side): No submental, no submandibular, no tonsillar, no preauricular, no posterior auricular and no occipital adenopathy present.       Head (left side): No submental, no submandibular, no tonsillar, no preauricular, no posterior auricular and no occipital adenopathy present.    She has no  cervical adenopathy.  Neurological: She is alert and oriented to person, place, and time. She has normal strength. She displays normal reflexes. No cranial nerve deficit or sensory deficit. She exhibits normal muscle tone. She displays a negative Romberg sign. Coordination normal.  Skin: Skin is warm, dry and intact. Capillary refill takes less than 2 seconds. She is not  diaphoretic. Nails show no clubbing.  Psychiatric: She has a normal mood and affect. Her speech is normal and behavior is normal. Judgment and thought content normal. Cognition and memory are normal.       Assessment:      Hx of seasonal allergies  Eustachian tube dysfunction, bilateral   Plan:     She is advised she is overdue for labs and physical and should call and schedule labs and then physical one week later as soon as possible.  Use Zyrtec and Flonase as directed daily. Do not use mothers medication.  She is to call Dr. Tami Ribas today 04/29/18 to set up office visit for evaluation within next week and to let him know she can do CT scan at [facility] for contract price.   Meds ordered this encounter  Medications  . predniSONE (STERAPRED UNI-PAK 21 TAB) 10 MG (21) TBPK tablet    Sig: PO: Take 6 tablets on day 1:Take 5 tablets day 2:Take 4 tablets day 3: Take 3 tablets day 4:Take 2 tablets day five: 5 Take 1 tablet day 6    Dispense:  21 tablet    Refill:  0  . cetirizine (ZYRTEC) 10 MG tablet    Sig: Take 1 tablet (10 mg total) by mouth daily.    Dispense:  30 tablet    Refill:  0   Provider thoroughly discussed in collaboration above plan with supervising physician Dr. Miguel Aschoff who is in agreement with the care plan as above.   Advised patient call the office or your primary care doctor for an appointment if no improvement within 72 hours or if any symptoms change or worsen at any time  Advised ER or urgent Care if after hours or on weekend. Call 911 for emergency symptoms at any time.Patinet verbalized understanding of all instructions given/reviewed and treatment plan and has no further questions or concerns at this time.    Patient verbalized understanding of all instructions given and denies any further questions at this time.

## 2018-05-02 ENCOUNTER — Ambulatory Visit: Payer: Self-pay | Admitting: Medical

## 2018-05-06 ENCOUNTER — Telehealth: Payer: Self-pay | Admitting: Adult Health

## 2018-05-06 NOTE — Telephone Encounter (Signed)
  Patient calls 05/06/18 says her ears are feeling much better since taking Prednisone. She reports that she did call [facility] about a CT scan after talking with Dr. Tami Ribas regarding her ears yesterday. She reports [facility] told her she would not get a discounted price. Advised patient that as far as provider is aware [facility] contract is still in place and she should have Dr. Tami Ribas order test of choice and then she could call and better evaluate her cost with the imaging center.  She reports she is taking Flonase and Zyrtec daily and feeling better.   Provider still recommends care advice from previous visit and seeing Dr. Tami Ribas in the next 2 weeks.   Advised patient call the office or your primary care doctor for an appointment if no improvement within 72 hours or if any symptoms change or worsen at any time  Advised ER or urgent Care if after hours or on weekend. Call 911 for emergency symptoms at any time.Patinet verbalized understanding of all instructions given/reviewed and treatment plan and has no further questions or concerns at this time.    Patient verbalized understanding of all instructions given and denies any further questions at this time.

## 2018-05-19 ENCOUNTER — Other Ambulatory Visit: Payer: Self-pay | Admitting: Otolaryngology

## 2018-05-19 ENCOUNTER — Other Ambulatory Visit: Payer: Self-pay | Admitting: Adult Health

## 2018-05-19 DIAGNOSIS — J329 Chronic sinusitis, unspecified: Secondary | ICD-10-CM

## 2018-05-23 ENCOUNTER — Ambulatory Visit
Admission: RE | Admit: 2018-05-23 | Discharge: 2018-05-23 | Disposition: A | Discharge status: Home / Self Care | Payer: BLUE CROSS/BLUE SHIELD | Source: Ambulatory Visit | Attending: Otolaryngology | Admitting: Otolaryngology

## 2018-05-23 DIAGNOSIS — J329 Chronic sinusitis, unspecified: Secondary | ICD-10-CM

## 2018-05-25 ENCOUNTER — Other Ambulatory Visit: Payer: Self-pay | Admitting: Medical

## 2018-05-25 DIAGNOSIS — Z0000 Encounter for general adult medical examination without abnormal findings: Secondary | ICD-10-CM

## 2018-05-25 NOTE — Progress Notes (Signed)
Patient needing labs for annual exam. Orders placed.

## 2018-06-01 ENCOUNTER — Other Ambulatory Visit: Payer: Self-pay

## 2018-06-01 DIAGNOSIS — Z0000 Encounter for general adult medical examination without abnormal findings: Secondary | ICD-10-CM

## 2018-06-02 LAB — CMP12+LP+TP+TSH+6AC+CBC/D/PLT
ALT: 11 IU/L (ref 0–32)
AST: 15 IU/L (ref 0–40)
Albumin/Globulin Ratio: 2.3 — ABNORMAL HIGH (ref 1.2–2.2)
Albumin: 4.5 g/dL (ref 3.5–5.5)
Alkaline Phosphatase: 46 IU/L (ref 39–117)
BASOS ABS: 0 10*3/uL (ref 0.0–0.2)
BASOS: 1 %
BILIRUBIN TOTAL: 0.2 mg/dL (ref 0.0–1.2)
BUN / CREAT RATIO: 13 (ref 9–23)
BUN: 11 mg/dL (ref 6–20)
CHLORIDE: 106 mmol/L (ref 96–106)
CHOL/HDL RATIO: 3.3 ratio (ref 0.0–4.4)
Calcium: 9.3 mg/dL (ref 8.7–10.2)
Cholesterol, Total: 172 mg/dL (ref 100–199)
Creatinine, Ser: 0.84 mg/dL (ref 0.57–1.00)
EOS (ABSOLUTE): 0.2 10*3/uL (ref 0.0–0.4)
EOS: 3 %
Estimated CHD Risk: <0.5 times avg. (ref 0.0–1.0)
Free Thyroxine Index: 2.1 (ref 1.2–4.9)
GFR calc Af Amer: 101 mL/min/{1.73_m2} (ref >59)
GFR calc non Af Amer: 88 mL/min/{1.73_m2} (ref >59)
GGT: 7 IU/L (ref 0–60)
GLUCOSE: 91 mg/dL (ref 65–99)
Globulin, Total: 2 g/dL (ref 1.5–4.5)
HDL: 52 mg/dL (ref >39)
HEMATOCRIT: 35.5 % (ref 34.0–46.6)
HEMOGLOBIN: 12.7 g/dL (ref 11.1–15.9)
IMMATURE GRANS (ABS): 0 10*3/uL (ref 0.0–0.1)
IRON: 69 ug/dL (ref 27–159)
Immature Granulocytes: 0 %
LDH: 138 IU/L (ref 119–226)
LDL Calculated: 102 mg/dL — ABNORMAL HIGH (ref 0–99)
LYMPHS: 30 %
Lymphocytes Absolute: 2.1 10*3/uL (ref 0.7–3.1)
MCH: 31.6 pg (ref 26.6–33.0)
MCHC: 35.8 g/dL — ABNORMAL HIGH (ref 31.5–35.7)
MCV: 88 fL (ref 79–97)
MONOCYTES: 8 %
Monocytes Absolute: 0.6 10*3/uL (ref 0.1–0.9)
NEUTROS PCT: 58 %
Neutrophils Absolute: 4 10*3/uL (ref 1.4–7.0)
PHOSPHORUS: 3.8 mg/dL (ref 2.5–4.5)
Platelets: 195 10*3/uL (ref 150–450)
Potassium: 4.3 mmol/L (ref 3.5–5.2)
RBC: 4.02 x10E6/uL (ref 3.77–5.28)
RDW: 12 % — ABNORMAL LOW (ref 12.3–15.4)
SODIUM: 140 mmol/L (ref 134–144)
T3 UPTAKE RATIO: 29 % (ref 24–39)
T4, Total: 7.4 ug/dL (ref 4.5–12.0)
TOTAL PROTEIN: 6.5 g/dL (ref 6.0–8.5)
TSH: 4.17 u[IU]/mL (ref 0.450–4.500)
Triglycerides: 91 mg/dL (ref 0–149)
URIC ACID: 3.2 mg/dL (ref 2.5–7.1)
VLDL CHOLESTEROL CAL: 18 mg/dL (ref 5–40)
WBC: 6.9 10*3/uL (ref 3.4–10.8)

## 2018-06-02 LAB — VITAMIN D 25 HYDROXY (VIT D DEFICIENCY, FRACTURES): Vit D, 25-Hydroxy: 23.3 ng/mL — ABNORMAL LOW (ref 30.0–100.0)

## 2018-06-06 ENCOUNTER — Ambulatory Visit: Payer: Self-pay | Admitting: Medical

## 2018-06-06 VITALS — BP 139/90 | HR 118 | Temp 99.5°F | Resp 16 | Wt 99.0 lb

## 2018-06-06 DIAGNOSIS — F17200 Nicotine dependence, unspecified, uncomplicated: Secondary | ICD-10-CM | POA: Insufficient documentation

## 2018-06-06 DIAGNOSIS — Z0000 Encounter for general adult medical examination without abnormal findings: Secondary | ICD-10-CM

## 2018-06-06 DIAGNOSIS — G479 Sleep disorder, unspecified: Secondary | ICD-10-CM

## 2018-06-06 DIAGNOSIS — E559 Vitamin D deficiency, unspecified: Secondary | ICD-10-CM | POA: Insufficient documentation

## 2018-06-06 DIAGNOSIS — Z23 Encounter for immunization: Secondary | ICD-10-CM

## 2018-06-06 DIAGNOSIS — Z113 Encounter for screening for infections with a predominantly sexual mode of transmission: Secondary | ICD-10-CM

## 2018-06-06 DIAGNOSIS — E7800 Pure hypercholesterolemia, unspecified: Secondary | ICD-10-CM

## 2018-06-06 HISTORY — DX: Encounter for general adult medical examination without abnormal findings: Z00.00

## 2018-06-06 LAB — POCT URINALYSIS DIPSTICK
Bilirubin, UA: NEGATIVE
Glucose, UA: NEGATIVE
Ketones, UA: NEGATIVE
LEUKOCYTES UA: NEGATIVE
NITRITE UA: NEGATIVE
PROTEIN UA: NEGATIVE
RBC UA: NEGATIVE
Spec Grav, UA: 1.01 (ref 1.010–1.025)
UROBILINOGEN UA: 0.2 U/dL
pH, UA: 7.5 (ref 5.0–8.0)

## 2018-06-06 NOTE — Progress Notes (Signed)
Subjective:    Patient ID: Holly Pineda, female    DOB: 06-30-1981, 37 y.o.   MRN: [id number]  HPI 37 yo female in non acute distress. 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 Children  51 yo  girl  and 5 yo boy. Smoker  3/4-1 ppd. Currently separated from husband, "he is living with his girlfriend" as of last week. Would like STD testing today.Asymptomatic but would like to be checked for STDs not  Having sexual intercourse with him since last Monday did use a condom.   moked  Last cigarette at about 1:30pm   Blood pressure 139/90, pulse (!) 118, temperature 99.5 F (37.5 C), temperature source Tympanic, resp. rate 16, weight 99 lb (44.9 kg), last menstrual period 05/14/2018, SpO2 100 %. No Known Allergies Review of Systems  Constitutional: Positive for fatigue (not sleeping well.).  HENT: Negative.   Eyes: Negative.   Respiratory: Negative.   Cardiovascular: Negative.   Gastrointestinal: Negative.   Endocrine: Negative.   Genitourinary: Negative.   Musculoskeletal: Negative.   Skin: Negative.   Allergic/Immunologic: Negative.   Neurological: Negative.   Hematological: Negative.   Psychiatric/Behavioral: Positive for sleep disturbance (2 year history of trouble sleeping. ). Negative for agitation, behavioral problems, confusion, decreased concentration, dysphoric mood, hallucinations, self-injury and suicidal ideas. The patient is not nervous/anxious and is not hyperactive.   Uses mothers Xanax for sleep 1/2 pill almost every night. I am having sleeping problems and that is why I am taking it" "should I get a sleeping pill". Wakes up in the middle of the night, then has trouble falling back asleep.    seen by ENT Dr. Richardson Landry with CT of sinuses wnl. 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 Matilde Haymaker this summer( 2019) goes yearly.  Exercises 1-2 days a week 20 min - walking. .  Objective:   Physical Exam   Constitutional: She is oriented to person, place, and time. She appears well-developed and well-nourished.  HENT:  Head: Normocephalic and atraumatic.  Right Ear: External ear normal.  Left Ear: External ear normal.  Nose: Nose normal.  Mouth/Throat: Oropharynx is clear and moist.  Eyes: Pupils are equal, round, and reactive to light. Conjunctivae and EOM are normal.  Neck: Normal range of motion. Neck supple.  Cardiovascular: Normal rate, regular rhythm, normal heart sounds and intact distal pulses.  Pulmonary/Chest: Effort normal and breath sounds normal.  Abdominal: Soft. Bowel sounds are normal.  Neurological: She is alert and oriented to person, place, and time.  Skin: Skin is warm and dry.  Psychiatric: She has a normal mood and affect. Her behavior is normal. Judgment and thought content normal.  Nursing note and vitals reviewed.  Tdap updated today  06/06/18 Flu updated today  06/06/18   Last  Pap 04/11/18 Leafy Ro MD at [facility]. Vision 20/30 left 20/30 right 20/30 bilateral.Last Eye exam My EYE doctor  summmer of  2019.Marland Kitchen  EKG  Sinus Rhythm 96 bpm. Urine dip results: wnl Recent Results (from the past 2160 hour(s))  VITAMIN D 25 Hydroxy (Vit-D Deficiency, Fractures)     Status: Abnormal   Collection Time: 06/01/18  7:45 AM  Result Value Ref Range   Vit D, 25-Hydroxy 23.3 (L) 30.0 - 100.0 ng/mL    Comment: Vitamin D deficiency has been defined by the [place] practice guideline as a level of serum 25-OH  vitamin D less than 20 ng/mL (1,2). The Endocrine Society went on to further define vitamin D insufficiency as a level between 21 and 29 ng/mL (2). 1. IOM (Institute of Medicine). 2010. Dietary reference    intakes for calcium and D. [place]: The    [employer]. 2. Holick MF, Binkley [place], Bischoff-Ferrari HA, et al.    Evaluation, treatment, and prevention of vitamin D    deficiency: an Endocrine Society  clinical practice    guideline. JCEM. 2011 Jul; 96(7):1911-30.   CMP12+LP+TP+TSH+6AC+CBC/D/Plt     Status: Abnormal   Collection Time: 06/01/18  7:45 AM  Result Value Ref Range   Glucose 91 65 - 99 mg/dL   Uric Acid 3.2 2.5 - 7.1 mg/dL    Comment:            Therapeutic target for gout patients: <6.0   BUN 11 6 - 20 mg/dL   Creatinine, Ser 0.84 0.57 - 1.00 mg/dL   GFR calc non Af Amer 88 >59 mL/min/1.73   GFR calc Af Amer 101 >59 mL/min/1.73   BUN/Creatinine Ratio 13 9 - 23   Sodium 140 134 - 144 mmol/L   Potassium 4.3 3.5 - 5.2 mmol/L   Chloride 106 96 - 106 mmol/L   Calcium 9.3 8.7 - 10.2 mg/dL   Phosphorus 3.8 2.5 - 4.5 mg/dL   Total Protein 6.5 6.0 - 8.5 g/dL   Albumin 4.5 3.5 - 5.5 g/dL   Globulin, Total 2.0 1.5 - 4.5 g/dL   Albumin/Globulin Ratio 2.3 (H) 1.2 - 2.2   Bilirubin Total 0.2 0.0 - 1.2 mg/dL   Alkaline Phosphatase 46 39 - 117 IU/L   LDH 138 119 - 226 IU/L   AST 15 0 - 40 IU/L   ALT 11 0 - 32 IU/L   GGT 7 0 - 60 IU/L   Iron 69 27 - 159 ug/dL   Cholesterol, Total 172 100 - 199 mg/dL   Triglycerides 91 0 - 149 mg/dL   HDL 52 >39 mg/dL   VLDL Cholesterol Cal 18 5 - 40 mg/dL   LDL Calculated 102 (H) 0 - 99 mg/dL   Chol/HDL Ratio 3.3 0.0 - 4.4 ratio    Comment:                                   T. Chol/HDL Ratio                                             Men  Women                               1/2 Avg.Risk  3.4    3.3                                   Avg.Risk  5.0    4.4                                2X Avg.Risk  9.6    7.1  3X Avg.Risk 23.4   11.0    Estimated CHD Risk  < 0.5 0.0 - 1.0 times avg.    Comment:                                   T. Chol/HDL Ratio                                             Men  Women                               1/2 Avg.Risk  3.4    3.3                                   Avg.Risk  5.0    4.4                                2X Avg.Risk  9.6    7.1                                3X Avg.Risk 23.4    11.0 The CHD Risk is based on the T. Chol/HDL ratio.  Other factors affect CHD Risk such as hypertension, smoking, diabetes, severe obesity, and family history of pre- mature CHD.    TSH 4.170 0.450 - 4.500 uIU/mL   T4, Total 7.4 4.5 - 12.0 ug/dL   T3 Uptake Ratio 29 24 - 39 %   Free Thyroxine Index 2.1 1.2 - 4.9   WBC 6.9 3.4 - 10.8 x10E3/uL   RBC 4.02 3.77 - 5.28 x10E6/uL   Hemoglobin 12.7 11.1 - 15.9 g/dL   Hematocrit 35.5 34.0 - 46.6 %   MCV 88 79 - 97 fL   MCH 31.6 26.6 - 33.0 pg   MCHC 35.8 (H) 31.5 - 35.7 g/dL   RDW 12.0 (L) 12.3 - 15.4 %   Platelets 195 150 - 450 x10E3/uL   Neutrophils 58 Not Estab. %   Lymphs 30 Not Estab. %   Monocytes 8 Not Estab. %   Eos 3 Not Estab. %   Basos 1 Not Estab. %   Neutrophils Absolute 4.0 1.4 - 7.0 x10E3/uL   Lymphocytes Absolute 2.1 0.7 - 3.1 x10E3/uL   Monocytes Absolute 0.6 0.1 - 0.9 x10E3/uL   EOS (ABSOLUTE) 0.2 0.0 - 0.4 x10E3/uL   Basophils Absolute 0.0 0.0 - 0.2 x10E3/uL   Immature Granulocytes 0 Not Estab. %   Immature Grans (Abs) 0.0 0.0 - 0.1 x10E3/uL  POCT urinalysis dipstick     Status: Normal   Collection Time: 06/06/18  3:34 PM  Result Value Ref Range   Color, UA straw    Clarity, UA clear    Glucose, UA Negative Negative   Bilirubin, UA neg    Ketones, UA neg    Spec Grav, UA 1.010 1.010 - 1.025   Blood, UA neg    pH, UA 7.5 5.0 - 8.0   Protein, UA Negative Negative   Urobilinogen, UA 0.2 0.2 or 1.0 E.U./dL   Nitrite, UA neg  Leukocytes, UA Negative Negative   Appearance     Odor     Assessment & Plan:  Annual physical exam , labs reviewed. LDL  Elevated reviewed diet and exercise with patient.  Tdap vaccine updated. Flu Vaccine 2019 updated.. STD screening -  HIV, RPR will return for this test. In 2 w.eeks does not have time to wait for blood drqw, GC/Chlamydia/Trich testing,  information given on GC and Chlamydia,.Will call patient if positive. Vit D deficiency add 1000 IU/day of Vitamin D 3 to your  daily MVI ( this has 1000 IU/day of D3).  Insomnia record a Sleep Diary, try 0.5mg  up to 3 mg/night of Melatonin for sleep.  Try Sleep Hygiene information given to pateint..Stop Xanax. Smoker wanting to quit but not the right time for her "in my mind"  quit when she was pregnant with  each of her children. Given [facility]  Work life [employer] for a resource for counseling. Follow up in 2 weeks.. Patient verbalizes understanding and has no questions at discharge.

## 2018-06-06 NOTE — Patient Instructions (Addendum)
Chlamydia, Female Chlamydia is an STD (sexually transmitted disease). It is a bacterial infection that spreads (is contagious) through sexual contact. Chlamydia can occur in different areas of the body, including:  The tube that moves urine from the bladder out of the body (urethra).  The lower part of the uterus (cervix).  The throat.  The rectum.  This condition is not difficult to treat. However, if left untreated, chlamydia can lead to more serious health problems, including pelvic inflammatory disorder (PID). PID can increase your risk of not being able to have children (sterility). What are the causes? Chlamydia is caused by the bacteria Chlamydia trachomatis. It is passed from an infected partner during sexual activity. Chlamydia can spread through contact with the genitals, mouth, or rectum. What are the signs or symptoms? In some cases, there may not be any symptoms for this condition (asymptomatic), especially early in the infection. If symptoms develop, they may include:  Burning with urination.  Frequent urination.  Vaginal discharge.  Redness, soreness, and swelling (inflammation) of the rectum.  Bleeding or discharge from the rectum.  Abdominal pain.  Pain during sexual intercourse.  Bleeding between menstrual periods.  Itching, burning, or redness in the eyes, or discharge from the eyes.  How is this diagnosed? This condition may be diagnosed with:  Urine tests.  Swab tests. Depending on your symptoms, your health care provider may use a cotton swab to collect discharge from your vagina or rectum to test for the bacteria.  A pelvic exam.  How is this treated? This condition is treated with antibiotic medicines. If you are pregnant, certain types of antibiotics will need to be avoided. Follow these instructions at home: Medicines  Take over-the-counter and prescription medicines only as told by your health care provider.  Take your antibiotic medicine  as told by your health care provider. Do not stop taking the antibiotic even if you start to feel better. Sexual activity  Tell sexual partners about your infection. This includes any oral, anal, or vaginal sex partners you have had within 60 days of when your symptoms started. Sexual partners should also be treated, even if they have no signs of the disease.  Do not have sex until you and your sexual partners have completed treatment and your health care provider says it is okay. If your health care provider prescribed you a single dose treatment, wait 7 days after taking the treatment before having sex. General instructions  It is your responsibility to get your test results. Ask your health care provider, or the department performing the test, when your results will be ready.  Get plenty of rest.  Eat a healthy, well-balanced diet.  Drink enough fluids to keep your urine clear or pale yellow.  Keep all follow-up visits as told by your health care provider. This is important. You may need to be tested for infection again 3 months after treatment. How is this prevented? The only sure way to prevent chlamydia is to avoid having sex. However, you can lower your risk by:  Using latex condoms correctly every time you have sex.  Not having multiple sexual partners.  Asking if your sexual partner has been tested for STIs and had negative results.  Contact a health care provider if:  You develop new symptoms or your symptoms do not get better after completing treatment.  You have a fever or chills.  You have pain during sexual intercourse. Get help right away if:  Your pain gets worse and does   not get better with medicine.  You develop flu-like symptoms, such as night sweats, sore throat, or muscle aches.  You experience nausea or vomiting.  You have difficulty swallowing.  You have bleeding between periods or after sex.  You have irregular menstrual periods.  You have  abdominal or lower back pain that does not get better with medicine.  You feel weak or dizzy, or you faint.  You are pregnant and you develop symptoms of chlamydia. Summary  Chlamydia is an STD (sexually transmitted disease). It is a bacterial infection that spreads (is contagious) through sexual contact.  This condition is not difficult to treat, however. If left untreated, chlamydia can lead to more serious health problems, including pelvic inflammatory disease (PID).  In some cases, there may not be any symptoms for this condition (asymptomatic).  This condition is treated with antibiotic medicines.  Using latex condoms correctly every time you have sex can help prevent chlamydia. This information is not intended to replace advice given to you by your health care provider. Make sure you discuss any questions you have with your health care provider. Document Released: 04/29/2005 Document Revised: 07/06/2016 Document Reviewed: 07/06/2016 Elsevier Interactive Patient Education  2018 [place] Gonorrhea is a bacterial infection that spreads from person to person through sexual contact. It can also spread from a mother to her baby during childbirth. You may be tested for gonorrhea if:  You have symptoms of gonorrhea.  You are pregnant or plan to get pregnant.  You have multiple sexual partners.  There are three gonorrhea tests:  Nucleic acid amplification test. For this test, you will provide a urine sample or your health care provider will use a swab to collect a fluid sample from the area of possible infection.  Nucleic acid hybridization test. For this test, your health care provider will use a swab to collect a fluid sample from the penis or vagina.  Gonorrhea culture. For this test, your health care provider will use a swab to collect a sample of bodily fluid from the area of possible infection. This test may be done in cases of sexual assault for legal  reasons.  In all three methods of testing, the sample is sent to a laboratory for analysis. How do I prepare for this test?  Let your health care provider know if you are taking antibiotic medicine.  Do not use vaginal creams or douches.  Try to arrive with a full bladder. What do the results mean? It is your responsibility to obtain your test results. Ask the lab or department performing the test when and how you will get your results. Contact your health care provider to discuss any questions you have about your results. The results of your gonorrhea test will either be negative or positive. Meaning of Negative Test Results If your test result is negative, then it is most likely that you do not have the disease. Meaning of Positive Test Results If your test result is positive, you have an active gonorrhea infection. Notify any sexual partners so that they can also be tested. Talk with your health care provider to discuss your results, treatment options, and if necessary, the need for more tests. Talk with your health care provider if you have any questions about your results. This information is not intended to replace advice given to you by your health care provider. Make sure you discuss any questions you have with your health care provider. Document Released: 08/21/2004 Document Revised: 03/24/2016 Document  Reviewed: 10/23/2013 Elsevier Interactive Patient Education  2018 [place].  Vitamin D Deficiency Vitamin D deficiency is when your body does not have enough vitamin D. Vitamin D is important because:  It helps your body use other minerals that your body needs.  It helps keep your bones strong and healthy.  It may help to prevent some diseases.  It helps your heart and other muscles work well.  You can get vitamin D by:  Eating foods with vitamin D in them.  Drinking or eating milk or other foods that have had vitamin D added to them.  Taking a vitamin D  supplement.  Being in the sun.  Not getting enough vitamin D can make your bones become soft. It can also cause other health problems. Follow these instructions at home:  Take medicines and supplements only as told by your doctor.  Eat foods that have vitamin D. These include: ? Dairy products, cereals, or juices with added vitamin D. Check the label for vitamin D. ? Fatty fish like salmon or trout. ? Eggs. ? Oysters.  Do not use tanning beds.  Stay at a healthy weight. Lose weight, if needed.  Keep all follow-up visits as told by your doctor. This is important. Contact a doctor if:  Your symptoms do not go away.  You feel sick to your stomach (nauseous).  Youthrow up (vomit).  You poop less often than usual or you have trouble pooping (constipation). This information is not intended to replace advice given to you by your health care provider. Make sure you discuss any questions you have with your health care provider. Document Released: 07/09/2011 Document Revised: 12/26/2015 Document Reviewed: 12/05/2014 Elsevier Interactive Patient Education  2018 [employer].  Why am I having this test? This test measures lipoproteins in your blood. Lipoproteins transport cholesterol, triglycerides, and other fats to and from your tissues and liver. Lipoprotein blood levels are used to predict your risk for coronary artery disease (CAD). Low-density lipoproteins (LDL) carry cholesterol from the liver and deposit it in the cells of your body. These are sometimes called "bad cholesterol." Having high levels of LDL increases your risk for CAD. Very low-density lipoproteins (VLDL) carry triglycerides through the bloodstream and deposit them in tissues. A high level of VLDL also increases your risk for CAD. High-density lipoproteins (HDL) collect cholesterol from your body's tissues and carry it to your liver. HDL is sometimes called "good cholesterol." Having high levels of HDL decreases your  risk for CAD. When all types of lipoproteins are measured together, the test is called a lipid profile test. It allows your health care provider to determine if you are at risk for heart disease or other blood vessel problems. What kind of sample is taken? A blood sample is required for this test. It is usually collected by inserting a needle into a vein or by sticking a finger with a small needle. How do I prepare for this test? You may be asked to avoid eating or drinking anything except water for 12-14 hours before the test. What are the reference ranges? Reference ranges are considered healthy ranges established after testing a large group of healthy people. Reference ranges may vary among different people, labs, and hospitals. It is your responsibility to obtain your test results. Ask the lab or department performing the test when and how you will get your results. Reference ranges for different types of lipoprotein values are as follows:  HDL: ? Female: greater than 45 mg/dL or greater  than 0.75 mmol/L (SI units). ? Female: greater than 55 mg/dL or greater than 0.91 mmol/L (SI units).  LDL: ? Adult: less than 130 mg/dL. ? Children: less than 110 mg/dL.  VLDL: 7-32 mg/dL.  What do the results mean? Results outside the reference range can mean you are at increased risk for CAD. The following is your risk for heart disease according to HDL levels:  Low: ? Men: greater than 60 mg/dL. ? Women: greater than 70 mg/dL.  Moderate: ? Men: 45-59 mg/dL. ? Women: 55-69 mg/dL.  High: ? Men: 25-44 mg/dL. ? Women: 35-54 mg/dL.  Talk with your health care provider to discuss your results, treatment options, and if necessary, the need for more tests. Talk with your health care provider if you have any questions about your results. Talk with your health care provider to discuss your results, treatment options, and if necessary, the need for more tests. Talk with your health care provider if you  have any questions about your results. This information is not intended to replace advice given to you by your health care provider. Make sure you discuss any questions you have with your health care provider. Document Released: 08/22/2004 Document Revised: 03/25/2016 Document Reviewed: 12/08/2013 Elsevier Interactive Patient Education  2018 [employer]. Insomnia Insomnia is a sleep disorder that makes it difficult to fall asleep or to stay asleep. Insomnia can cause tiredness (fatigue), low energy, difficulty concentrating, mood swings, and poor performance at work or school. There are three different ways to classify insomnia:  Difficulty falling asleep.  Difficulty staying asleep.  Waking up too early in the morning.  Any type of insomnia can be long-term (chronic) or short-term (acute). Both are common. Short-term insomnia usually lasts for three months or less. Chronic insomnia occurs at least three times a week for longer than three months. What are the causes? Insomnia may be caused by another condition, situation, or substance, such as:  Anxiety.  Certain medicines.  Gastroesophageal reflux disease (GERD) or other gastrointestinal conditions.  Asthma or other breathing conditions.  Restless legs syndrome, sleep apnea, or other sleep disorders.  Chronic pain.  Menopause. This may include hot flashes.  Stroke.  Abuse of alcohol, tobacco, or illegal drugs.  Depression.  Caffeine.  Neurological disorders, such as Alzheimer disease.  An overactive thyroid (hyperthyroidism).  The cause of insomnia may not be known. What increases the risk? Risk factors for insomnia include:  Gender. Women are more commonly affected than men.  Age. Insomnia is more common as you get older.  Stress. This may involve your professional or personal life.  Income. Insomnia is more common in people with lower income.  Lack of exercise.  Irregular work schedule or night  shifts.  Traveling between different time zones.  What are the signs or symptoms? If you have insomnia, trouble falling asleep or trouble staying asleep is the main symptom. This may lead to other symptoms, such as:  Feeling fatigued.  Feeling nervous about going to sleep.  Not feeling rested in the morning.  Having trouble concentrating.  Feeling irritable, anxious, or depressed.  How is this treated? Treatment for insomnia depends on the cause. If your insomnia is caused by an underlying condition, treatment will focus on addressing the condition. Treatment may also include:  Medicines to help you sleep.  Counseling or therapy.  Lifestyle adjustments.  Follow these instructions at home:  Take medicines only as directed by your health care provider.  Keep regular sleeping and waking hours. Avoid  naps.  Keep a sleep diary to help you and your health care provider figure out what could be causing your insomnia. Include: ? When you sleep. ? When you wake up during the night. ? How well you sleep. ? How rested you feel the next day. ? Any side effects of medicines you are taking. ? What you eat and drink.  Make your bedroom a comfortable place where it is easy to fall asleep: ? Put up shades or special blackout curtains to block light from outside. ? Use a white noise machine to block noise. ? Keep the temperature cool.  Exercise regularly as directed by your health care provider. Avoid exercising right before bedtime.  Use relaxation techniques to manage stress. Ask your health care provider to suggest some techniques that may work well for you. These may include: ? Breathing exercises. ? Routines to release muscle tension. ? Visualizing peaceful scenes.  Cut back on alcohol, caffeinated beverages, and cigarettes, especially close to bedtime. These can disrupt your sleep.  Do not overeat or eat spicy foods right before bedtime. This can lead to digestive discomfort  that can make it hard for you to sleep.  Limit screen use before bedtime. This includes: ? Watching TV. ? Using your smartphone, tablet, and computer.  Stick to a routine. This can help you fall asleep faster. Try to do a quiet activity, brush your teeth, and go to bed at the same time each night.  Get out of bed if you are still awake after 15 minutes of trying to sleep. Keep the lights down, but try reading or doing a quiet activity. When you feel sleepy, go back to bed.  Make sure that you drive carefully. Avoid driving if you feel very sleepy.  Keep all follow-up appointments as directed by your health care provider. This is important. Contact a health care provider if:  You are tired throughout the day or have trouble in your daily routine due to sleepiness.  You continue to have sleep problems or your sleep problems get worse. Get help right away if:  You have serious thoughts about hurting yourself or someone else. This information is not intended to replace advice given to you by your health care provider. Make sure you discuss any questions you have with your health care provider. Document Released: 07/17/2000 Document Revised: 12/20/2015 Document Reviewed: 04/20/2014 Elsevier Interactive Patient Education  2018 [employer].  Vitamin D Deficiency Vitamin D deficiency is when your body does not have enough vitamin D. Vitamin D is important because:  It helps your body use other minerals that your body needs.  It helps keep your bones strong and healthy.  It may help to prevent some diseases.  It helps your heart and other muscles work well.  You can get vitamin D by:  Eating foods with vitamin D in them.  Drinking or eating milk or other foods that have had vitamin D added to them.  Taking a vitamin D supplement.  Being in the sun.  Not getting enough vitamin D can make your bones become soft. It can also cause other health problems. Follow these instructions  at home:  Take medicines and supplements only as told by your doctor.  Eat foods that have vitamin D. These include: ? Dairy products, cereals, or juices with added vitamin D. Check the label for vitamin D. ? Fatty fish like salmon or trout. ? Eggs. ? Oysters.  Do not use tanning beds.  Stay at a  healthy weight. Lose weight, if needed.  Keep all follow-up visits as told by your doctor. This is important. Contact a doctor if:  Your symptoms do not go away.  You feel sick to your stomach (nauseous).  Youthrow up (vomit).  You poop less often than usual or you have trouble pooping (constipation). This information is not intended to replace advice given to you by your health care provider. Make sure you discuss any questions you have with your health care provider. Document Released: 07/09/2011 Document Revised: 12/26/2015 Document Reviewed: 12/05/2014 Elsevier Interactive Patient Education  [employer].

## 2018-06-07 ENCOUNTER — Encounter: Payer: Self-pay | Admitting: Medical

## 2018-06-08 LAB — CHLAMYDIA/GONOCOCCUS/TRICHOMONAS, NAA
Chlamydia by NAA: NEGATIVE
Gonococcus by NAA: NEGATIVE
TRICH VAG BY NAA: NEGATIVE

## 2018-09-01 ENCOUNTER — Ambulatory Visit: Payer: Self-pay | Admitting: Medical

## 2018-09-01 ENCOUNTER — Encounter: Payer: Self-pay | Admitting: Medical

## 2018-09-01 VITALS — BP 130/90 | HR 100 | Temp 98.1°F | Resp 16 | Ht 59.0 in | Wt 98.0 lb

## 2018-09-01 DIAGNOSIS — J0100 Acute maxillary sinusitis, unspecified: Secondary | ICD-10-CM

## 2018-09-01 DIAGNOSIS — H6983 Other specified disorders of Eustachian tube, bilateral: Secondary | ICD-10-CM

## 2018-09-01 MED ORDER — AMOXICILLIN-POT CLAVULANATE 875-125 MG PO TABS: 1.0000 | ORAL_TABLET | Freq: Two times a day (BID) | ORAL | 0 refills | Status: DC

## 2018-09-01 NOTE — Progress Notes (Signed)
Subjective:    Patient ID: Holly Pineda, female    DOB: 1980/08/29, 38 y.o.   MRN: [id number]  HPI 38 yo female in non acute disttress Facial pressure and headache, nasal congestion.Started  CT scan.in October 2019 seen by ENT. Continues with pressure. On Azeltine and Flonase and doing sudafed over the last week.  Headache frontal , and a "vein popped out" on the right side temple area. Denies cough  Or fever or chills. No chest pain or shortness of breath. Headaches weekly. Seen by both Dr. Particia Lather and Dr. Tami Ribas. Hearing was checked which was normal per patient  Using Sudafed once a day.   No Known Allergies  Review of Systems  Constitutional: Negative for chills and fever.  HENT: Positive for congestion (using flonse nase and other nasal spray prescribed by ENT), sinus pressure and sinus pain. Negative for ear discharge, ear pain, postnasal drip, rhinorrhea and sore throat.   Eyes: Negative for discharge and itching.  Respiratory: Negative for cough, chest tightness, shortness of breath and wheezing.   Cardiovascular: Negative for chest pain.  Gastrointestinal: Negative for anal bleeding.  Endocrine: Negative for polydipsia, polyphagia and polyuria.  Genitourinary: Negative for dysuria.  Musculoskeletal: Negative for myalgias.  Skin: Negative for rash.  Allergic/Immunologic: Positive for environmental allergies. Negative for food allergies.  Neurological: Positive for headaches (forehead). Negative for dizziness, syncope and light-headedness.  Hematological: Negative for adenopathy.  Psychiatric/Behavioral: Negative for behavioral problems, self-injury and suicidal ideas.   Quit smoking  Last Tuesday. ENT recommended allergy test but she has not gone back be cause they gave her no answer on why she is always congested.     Objective:   Physical Exam Nursing note reviewed. Exam conducted with a chaperone present.  Constitutional:      Appearance: Normal appearance. She  is normal weight.  HENT:     Head: Normocephalic and atraumatic.     Jaw: There is normal jaw occlusion.     Salivary Glands: Right salivary gland is not diffusely enlarged or tender. Left salivary gland is not diffusely enlarged or tender.     Right Ear: Hearing, ear canal and external ear normal. A middle ear effusion is present.     Left Ear: Hearing, ear canal and external ear normal. A middle ear effusion is present.     Nose: Congestion and rhinorrhea present. Rhinorrhea is purulent.     Left Turbinates: Enlarged and swollen. Not pale.     Mouth/Throat:     Lips: Pink.     Mouth: Mucous membranes are moist.     Pharynx: Oropharynx is clear. No pharyngeal swelling, oropharyngeal exudate, posterior oropharyngeal erythema or uvula swelling.     Tonsils: Swelling: 2+ on the right. 2+ on the left.  Eyes:     Extraocular Movements: Extraocular movements intact.     Conjunctiva/sclera: Conjunctivae normal.     Pupils: Pupils are equal, round, and reactive to light.  Neck:     Musculoskeletal: Normal range of motion and neck supple.  Cardiovascular:     Rate and Rhythm: Normal rate and regular rhythm.     Heart sounds: Normal heart sounds.  Pulmonary:     Effort: Pulmonary effort is normal.     Breath sounds: Normal breath sounds.  Lymphadenopathy:     Cervical: No cervical adenopathy.  Skin:    General: Skin is warm and dry.  Neurological:     General: No focal deficit present.     Mental Status:  She is alert and oriented to person, place, and time.  Psychiatric:        Mood and Affect: Mood normal.        Behavior: Behavior normal.        Thought Content: Thought content normal.        Judgment: Judgment normal.           Assessment & Plan:  Sinusitis maxillary, Headaches forehead Eustachian tube dysfunction Meds ordered this encounter  Medications  . amoxicillin-clavulanate (AUGMENTIN) 875-125 MG tablet    Sig: Take 1 tablet by mouth 2 (two) times daily.     Dispense:  20 tablet    Refill:  0  Return in 3-5 days if owrsening otherwise  11 day recheck.Recommended patient ot follow up with ENT for allergy testing. Patient verbalizes understanding and has no questions at discharge.

## 2018-09-13 ENCOUNTER — Ambulatory Visit: Payer: Self-pay | Admitting: Medical

## 2018-09-15 ENCOUNTER — Ambulatory Visit: Payer: Self-pay | Admitting: Nurse Practitioner

## 2018-09-15 ENCOUNTER — Encounter: Payer: Self-pay | Admitting: Nurse Practitioner

## 2018-09-15 VITALS — BP 117/76 | HR 94 | Resp 16 | Ht 59.0 in | Wt 98.0 lb

## 2018-09-15 DIAGNOSIS — R448 Other symptoms and signs involving general sensations and perceptions: Secondary | ICD-10-CM

## 2018-09-15 DIAGNOSIS — R51 Headache: Secondary | ICD-10-CM

## 2018-09-15 DIAGNOSIS — R6889 Other general symptoms and signs: Secondary | ICD-10-CM

## 2018-09-15 DIAGNOSIS — R519 Headache, unspecified: Secondary | ICD-10-CM

## 2018-09-15 DIAGNOSIS — J302 Other seasonal allergic rhinitis: Secondary | ICD-10-CM

## 2018-09-15 MED ORDER — FEXOFENADINE HCL 180 MG PO TABS: 180.0000 mg | ORAL_TABLET | Freq: Every day | ORAL | 0 refills | Status: DC

## 2018-09-15 NOTE — Progress Notes (Signed)
   Subjective:    Patient ID: Holly Pineda, female    DOB: 03-11-1981, 38 y.o.   MRN: [id number]  HPI Aamira comes to the [facility] clinic today to follow-up for sinusitis diagnosis September 01, 2018.  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 She has completed her course of antibiotics as directed and reports symptoms have improved but she continues to have pressure to the maxillary area and headaches to front and back of her head about 3 times a week. She takes 1 ibuprofen with relief and sometimes has to take 2. Also reports she continues to hear something like "scracthing paper in left ear or a cracking sound". Denies any neck pain, popping or clicking, sinus tenderness or ear pain.  Has seen ENT in the past had a CT of the maxillary/facial area in October 2019 which was normal but showed septal deviation and spurring.   Pulled and reviewed maxillary CT today and discussed with patient which she reports wasn't aware of septal deviation.  Review of Systems  HENT: Positive for sinus pressure. Negative for ear pain and sinus pain.   Respiratory: Negative for cough.   Cardiovascular: Negative for chest pain.  Neurological: Positive for headaches.       Objective:   Physical Exam Vitals signs reviewed.  Constitutional:      Appearance: Normal appearance. She is well-developed.  HENT:     Head: Normocephalic and atraumatic.     Comments: No maxillary or frontal sinus tenderness    Right Ear: Tympanic membrane and ear canal normal.     Left Ear: Tympanic membrane and ear canal normal.     Nose: Nose normal.     Mouth/Throat:     Mouth: Mucous membranes are moist.  Neck:     Musculoskeletal: Normal range of motion and neck supple. No muscular tenderness.     Comments: No tenderness, crepitus or clicking to the cervical column Cardiovascular:     Rate and Rhythm: Normal rate and regular rhythm.     Heart  sounds: Normal heart sounds.  Pulmonary:     Effort: Pulmonary effort is normal. No respiratory distress.     Breath sounds: Normal breath sounds.  Abdominal:     General: Bowel sounds are normal.     Palpations: Abdomen is soft.  Musculoskeletal: Normal range of motion.        General: No swelling or deformity.  Lymphadenopathy:     Cervical: No cervical adenopathy.  Skin:    General: Skin is warm and dry.  Neurological:     Mental Status: She is alert and oriented to person, place, and time.  Psychiatric:        Mood and Affect: Mood normal.           Assessment & Plan:

## 2018-09-15 NOTE — Patient Instructions (Addendum)
Holly Pineda it was nice meeting you today! Please continue your current regimen but resume the nasal rinse ENT has recommended Because of your headaches and sinus pressure I want you to do a short term of Fexofenadine Please follow up with ENT per your PCP recommendations Take Tylenol or Ibuprofen with food as directed if you need to for your intermittent headaches Information printed and given today on septal deviation Return to the clinic if no improvement

## 2019-01-11 ENCOUNTER — Telehealth: Payer: Self-pay

## 2019-01-11 ENCOUNTER — Other Ambulatory Visit: Payer: Self-pay | Admitting: Medical

## 2019-01-11 DIAGNOSIS — G479 Sleep disorder, unspecified: Secondary | ICD-10-CM

## 2019-01-11 DIAGNOSIS — F419 Anxiety disorder, unspecified: Secondary | ICD-10-CM

## 2019-01-11 MED ORDER — HYDROXYZINE HCL 25 MG PO TABS: ORAL_TABLET | ORAL | 0 refills | Status: DC

## 2019-01-11 NOTE — Progress Notes (Signed)
Patient called and talked to [employer]. Patient using 5mg  of Melatonin and not helping with sleep.  She increased her Melatonin to  5mg  and that did not help, she added Xanax which she gets from her mother. ( I had asked her to stop using her mother Xanax and reviewed with patient in the past about addiction to this medication. She has sleeping disturbances lasf November 2019 and was suppose to follow up in 2 weeks with a sleep diary. She did not .  She tell RN Carlota Raspberry that she has been keeping a sleeping journal since May.    She denies naps. She denies use of computers or phone 2 hours before bedtime.  Recommended she increase exercise to daily to help with her anxiety. Suggest patient follow up with [facility], but she feels she does not need to talk to anyone about her anxiety or sleeping problems.   Recommended  Decrease melatonin from 5mg  to 3 mg if this does not help reduce to  1.5mg  as high does cani interfere with sleep.  Patient is petite.  Stop Xanax she does not have a prescription for this she is using her mothers.   She did say she was anxious. She just started back to work.  If she continues with anxiety  And calls back we could increase Hydroxyzine to 25mg  one every 6 hours as needed for anxiety.again I would recommend counseling.

## 2019-01-11 NOTE — Telephone Encounter (Signed)
Patient contacted our office c/o difficulty sleeping. She hasn't been sleeping well since March which was the start of the Covid 19 crisis.  She has been working at home since the pandemic and has increased her Melatonin from 3 to 5 mg and added Xanax .25 to her daily medications to help her sleep.  She is presently taking Natural Vitalift which contains 5 mg of melatonin. She has not experienced any improvement at all.  We instructed her to decrease her Melatonin to 3 mg at hs and stop the Xanax. She also needs to increase her activity, no phone or computer use before bed and stop watching the news which probably increases her anxiety.  We will call her in some Hydroxyzine for anxiety. Patient verbalizes understanding of instructions.

## 2019-01-18 ENCOUNTER — Telehealth: Payer: Self-pay | Admitting: Medical

## 2019-01-18 DIAGNOSIS — G479 Sleep disorder, unspecified: Secondary | ICD-10-CM

## 2019-01-18 NOTE — Telephone Encounter (Signed)
Called patient, left generic message  On her voicemail.  Will try again tomorrow.   Reason for call : sleep concerns.   H.Ratcliffe PA-C

## 2019-01-23 NOTE — Progress Notes (Signed)
Called patient left VM for patient to return call on 01/18/2019.

## 2019-01-30 ENCOUNTER — Telehealth: Payer: Self-pay | Admitting: Medical

## 2019-01-30 ENCOUNTER — Other Ambulatory Visit: Payer: Self-pay

## 2019-01-30 DIAGNOSIS — G4700 Insomnia, unspecified: Secondary | ICD-10-CM

## 2019-01-30 NOTE — Progress Notes (Signed)
Patient gives permission to do telemedicine appointment. 38 yo female in non acute distress. She states she has trouble falling asleep "even my Xanax is not helping". She states that she easily wakes up and has trouble going to sleep.  Sometimes she can fall asleep in 20 min others it takes an hour. She has tried meletonin and Hdroxyzine.  Asked patient  If she has not tried Benadryl before.   I recommended  Benedryl  12.5 mg initially if needed she could go to  50 mg. Then we discussed anxiety and depression. She has declined cognitive behavioral therapy , however today she is more interested in possibly taking a medication that could help her anxiety level.   I asked if she could come see me on Monday and agreed to do so. Appointment made for  July  6th. If she has any problems and needs assistance before then she may call the office.   Patient verbalizes understanding and has no questions at the end oc our conversation.

## 2019-02-06 ENCOUNTER — Encounter: Payer: Self-pay | Admitting: Medical

## 2019-02-06 ENCOUNTER — Ambulatory Visit: Payer: Self-pay | Admitting: Medical

## 2019-02-06 ENCOUNTER — Other Ambulatory Visit: Payer: Self-pay

## 2019-02-06 VITALS — BP 120/74 | HR 98 | Temp 99.5°F | Resp 16 | Wt 100.8 lb

## 2019-02-06 DIAGNOSIS — E559 Vitamin D deficiency, unspecified: Secondary | ICD-10-CM

## 2019-02-06 DIAGNOSIS — G4700 Insomnia, unspecified: Secondary | ICD-10-CM

## 2019-02-06 DIAGNOSIS — F419 Anxiety disorder, unspecified: Secondary | ICD-10-CM

## 2019-02-06 MED ORDER — SERTRALINE HCL 50 MG PO TABS: ORAL_TABLET | ORAL | 0 refills | Status: DC

## 2019-02-06 NOTE — Progress Notes (Signed)
Subjective:     Patient ID: Holly Pineda, female   DOB: 09-03-1980, 38 y.o.   MRN: [id number]  HPI  38 yo female in non acute distress, having trouble falling asleep. Has tried Melatonin  And Hydroxyzineand Benadrly. Last night woke up at 4:30am. Sometimes feels as if she has trouble falling asleep though not every day.  Mostly gets up early from 2:30 am -4:30 am. And cannot get back to sleep. Denies worrying, feels that she is over thinking.  This occurs 4-5 ngihts/week for the last 3 months. She also was taking her mothers Xanax but says that has stopped working now as well.  Not worsening.   Exercising none at  late  night, denies use Caffeine stimulants afater 12 noon.. Not smoking or vaping. Denes snoring, sleep walking or talking in her sleep.   Irritable and fatigued during  the next day.   Taking Vit D3 3,000IU/day.  Grandmother on antidepressant for  Sleeping. Mother takes nothing for sleep. She also has sleeping problems.     Blood pressure 120/74, pulse 98, temperature 99.5 F (37.5 C), temperature source Tympanic, resp. rate 16, weight 100 lb 12.8 oz (45.7 kg), SpO2 100 %. No Known Allergies   Review of Systems  Endocrine: Negative.   Genitourinary: Negative for difficulty urinating.  Musculoskeletal: Negative.   Skin: Negative.   Allergic/Immunologic: Positive for environmental allergies. Negative for food allergies.  Neurological: Positive for weakness (mild but on period which are heavy bleeding) and headaches (occasional ha 1/week above the eyebrows takes advil 1-2 tablets which).  Hematological: Negative for adenopathy. Does not bruise/bleed easily.  Psychiatric/Behavioral: Positive for sleep disturbance. Negative for self-injury and suicidal ideas. The patient is nervous/anxious.       Some allergy symptoms Objective:   Physical Exam Nursing note reviewed.  Constitutional:      Appearance: Normal appearance.     Comments: Well groomed  HENT:    Head: Normocephalic and atraumatic.     Mouth/Throat:     Mouth: Mucous membranes are moist.  Eyes:     General: Lids are normal.     Extraocular Movements: Extraocular movements intact.     Conjunctiva/sclera: Conjunctivae normal.     Pupils: Pupils are equal, round, and reactive to light.  Neck:     Musculoskeletal: Neck supple.  Cardiovascular:     Rate and Rhythm: Normal rate and regular rhythm.  Pulmonary:     Effort: Pulmonary effort is normal.     Breath sounds: Normal breath sounds.  Skin:    General: Skin is warm and dry.     Capillary Refill: Capillary refill takes less than 2 seconds.  Neurological:     General: No focal deficit present.     Mental Status: She is alert and oriented to person, place, and time.     GCS: GCS eye subscore is 4. GCS verbal subscore is 5. GCS motor subscore is 6.     Cranial Nerves: Cranial nerves are intact.     Sensory: Sensation is intact.     Motor: Motor function is intact.     Coordination: Coordination is intact.     Gait: Gait is intact.  Psychiatric:        Attention and Perception: Attention and perception normal.        Mood and Affect: Mood normal.        Speech: Speech normal.        Behavior: Behavior normal.  Thought Content: Thought content normal.        Cognition and Memory: Cognition normal.        Judgment: Judgment normal.   She is calm and rational on my exam.     Assessment:  Insomnia, Anxiety   Plan Patient verbalzes that she does not want Xanax, willing to try an SSRI to help with anxiety. Meds ordered this encounter  Medications  . sertraline (ZOLOFT) 50 MG tablet    Sig: Take 1/2 tablet by mouth at night x 7 days. Then take 1 tablet daily at night.    Dispense:  30 tablet    Refill:  0    Lab Orders     Thyroid Panel With TSH     CBC with Differential/Platelet     Ferritin     Iron and TIBC     VITAMIN D 25 Hydroxy (Vit-D Deficiency, Fractures) I have talked to [place] previously and  she does not want any counselling.  I have suggest EAP but again she does not want to talk to anyone. I have also discussed sleep hygiene with Janeice previously and from my history it sounds as if she is doing good sleep hygiene.  Sleep Study ordered.  November phsical with me.    Patient verbalizes understanding and has no questions at discharge.

## 2019-02-06 NOTE — Patient Instructions (Signed)
Sertraline tablets What is this medicine? SERTRALINE (SER tra leen) is used to treat depression. It may also be used to treat obsessive compulsive disorder, panic disorder, post-trauma stress, premenstrual dysphoric disorder (PMDD) or social anxiety. This medicine may be used for other purposes; ask your health care provider or pharmacist if you have questions. COMMON BRAND NAME(S): Zoloft What should I tell my health care provider before I take this medicine? They need to know if you have any of these conditions:  bleeding disorders  bipolar disorder or a family history of bipolar disorder  glaucoma  heart disease  high blood pressure  history of irregular heartbeat  history of low levels of calcium, magnesium, or potassium in the blood  if you often drink alcohol  liver disease  receiving electroconvulsive therapy  seizures  suicidal thoughts, plans, or attempt; a previous suicide attempt by you or a family member  take medicines that treat or prevent blood clots  thyroid disease  an unusual or allergic reaction to sertraline, other medicines, foods, dyes, or preservatives  pregnant or trying to get pregnant  breast-feeding How should I use this medicine? Take this medicine by mouth with a glass of water. Follow the directions on the prescription label. You can take it with or without food. Take your medicine at regular intervals. Do not take your medicine more often than directed. Do not stop taking this medicine suddenly except upon the advice of your doctor. Stopping this medicine too quickly may cause serious side effects or your condition may worsen. A special MedGuide will be given to you by the pharmacist with each prescription and refill. Be sure to read this information carefully each time. Talk to your pediatrician regarding the use of this medicine in children. While this drug may be prescribed for children as young as 7 years for selected conditions,  precautions do apply. Overdosage: If you think you have taken too much of this medicine contact a poison control center or emergency room at once. NOTE: This medicine is only for you. Do not share this medicine with others. What if I miss a dose? If you miss a dose, take it as soon as you can. If it is almost time for your next dose, take only that dose. Do not take double or extra doses. What may interact with this medicine? Do not take this medicine with any of the following medications:  cisapride  dronedarone  linezolid  MAOIs like Carbex, Eldepryl, Marplan, Nardil, and Parnate  methylene blue (injected into a vein)  pimozide  thioridazine This medicine may also interact with the following medications:  alcohol  amphetamines  aspirin and aspirin-like medicines  certain medicines for depression, anxiety, or psychotic disturbances  certain medicines for fungal infections like ketoconazole, fluconazole, posaconazole, and itraconazole  certain medicines for irregular heart beat like flecainide, quinidine, propafenone  certain medicines for migraine headaches like almotriptan, eletriptan, frovatriptan, naratriptan, rizatriptan, sumatriptan, zolmitriptan  certain medicines for sleep  certain medicines for seizures like carbamazepine, valproic acid, phenytoin  certain medicines that treat or prevent blood clots like warfarin, enoxaparin, dalteparin  cimetidine  digoxin  diuretics  fentanyl  isoniazid  lithium  NSAIDs, medicines for pain and inflammation, like ibuprofen or naproxen  other medicines that prolong the QT interval (cause an abnormal heart rhythm) like dofetilide  rasagiline  safinamide  supplements like St. John's wort, kava kava, valerian  tolbutamide  tramadol  tryptophan This list may not describe all possible interactions. Give your health care provider  a list of all the medicines, herbs, non-prescription drugs, or dietary supplements  you use. Also tell them if you smoke, drink alcohol, or use illegal drugs. Some items may interact with your medicine. What should I watch for while using this medicine? Tell your doctor if your symptoms do not get better or if they get worse. Visit your doctor or health care professional for regular checks on your progress. Because it may take several weeks to see the full effects of this medicine, it is important to continue your treatment as prescribed by your doctor. Patients and their families should watch out for new or worsening thoughts of suicide or depression. Also watch out for sudden changes in feelings such as feeling anxious, agitated, panicky, irritable, hostile, aggressive, impulsive, severely restless, overly excited and hyperactive, or not being able to sleep. If this happens, especially at the beginning of treatment or after a change in dose, call your health care professional. You may get drowsy or dizzy. Do not drive, use machinery, or do anything that needs mental alertness until you know how this medicine affects you. Do not stand or sit up quickly, especially if you are an older patient. This reduces the risk of dizzy or fainting spells. Alcohol may interfere with the effect of this medicine. Avoid alcoholic drinks. Your mouth may get dry. Chewing sugarless gum or sucking hard candy, and drinking plenty of water may help. Contact your doctor if the problem does not go away or is severe. What side effects may I notice from receiving this medicine? Side effects that you should report to your doctor or health care professional as soon as possible:  allergic reactions like skin rash, itching or hives, swelling of the face, lips, or tongue  anxious  black, tarry stools  changes in vision  confusion  elevated mood, decreased need for sleep, racing thoughts, impulsive behavior  eye pain  fast, irregular heartbeat  feeling faint or lightheaded, falls  feeling agitated,  angry, or irritable  hallucination, loss of contact with reality  loss of balance or coordination  loss of memory  painful or prolonged erections  restlessness, pacing, inability to keep still  seizures  stiff muscles  suicidal thoughts or other mood changes  trouble sleeping  unusual bleeding or bruising  unusually weak or tired  vomiting Side effects that usually do not require medical attention (report to your doctor or health care professional if they continue or are bothersome):  change in appetite or weight  change in sex drive or performance  diarrhea  increased sweating  indigestion, nausea  tremors This list may not describe all possible side effects. Call your doctor for medical advice about side effects. You may report side effects to FDA at 1-800-FDA-1088. Where should I keep my medicine? Keep out of the reach of children. Store at room temperature between 15 and 30 degrees C (59 and 86 degrees F). Throw away any unused medicine after the expiration date. NOTE: This sheet is a summary. It may not cover all possible information. If you have questions about this medicine, talk to your doctor, pharmacist, or health care provider.  2020 Elsevier/Gold Standard (2018-07-12 10:09:27)  

## 2019-02-07 LAB — CBC WITH DIFFERENTIAL/PLATELET
Basophils Absolute: 0.1 10*3/uL (ref 0.0–0.2)
Basos: 1 %
EOS (ABSOLUTE): 0.1 10*3/uL (ref 0.0–0.4)
Eos: 2 %
Hematocrit: 36.3 % (ref 34.0–46.6)
Hemoglobin: 12.8 g/dL (ref 11.1–15.9)
Immature Grans (Abs): 0 10*3/uL (ref 0.0–0.1)
Immature Granulocytes: 0 %
Lymphocytes Absolute: 1.9 10*3/uL (ref 0.7–3.1)
Lymphs: 28 %
MCH: 31.5 pg (ref 26.6–33.0)
MCHC: 35.3 g/dL (ref 31.5–35.7)
MCV: 89 fL (ref 79–97)
Monocytes Absolute: 0.5 10*3/uL (ref 0.1–0.9)
Monocytes: 8 %
Neutrophils Absolute: 4.2 10*3/uL (ref 1.4–7.0)
Neutrophils: 61 %
Platelets: 222 10*3/uL (ref 150–450)
RBC: 4.06 x10E6/uL (ref 3.77–5.28)
RDW: 12 % (ref 11.7–15.4)
WBC: 6.8 10*3/uL (ref 3.4–10.8)

## 2019-02-07 LAB — IRON AND TIBC
Iron Saturation: 24 % (ref 15–55)
Iron: 82 ug/dL (ref 27–159)
Total Iron Binding Capacity: 348 ug/dL (ref 250–450)
UIBC: 266 ug/dL (ref 131–425)

## 2019-02-07 LAB — THYROID PANEL WITH TSH
Free Thyroxine Index: 1.4 (ref 1.2–4.9)
T3 Uptake Ratio: 24 % (ref 24–39)
T4, Total: 5.9 ug/dL (ref 4.5–12.0)
TSH: 3.73 u[IU]/mL (ref 0.450–4.500)

## 2019-02-07 LAB — FERRITIN: Ferritin: 29 ng/mL (ref 15–150)

## 2019-02-07 LAB — VITAMIN D 25 HYDROXY (VIT D DEFICIENCY, FRACTURES): Vit D, 25-Hydroxy: 49 ng/mL (ref 30.0–100.0)

## 2019-02-07 NOTE — Telephone Encounter (Signed)
Called Ms.Eichhorst to inform her labs were all wnl per H.Ratcliffe PA-C (2 pt identifiers used). Pt verbalizes understanding. Ms.Cullinane asking if she can take a whole Zoloft as she took a half tab (1st dose) last night as instructed by PA, states it took her 2.5 hours to go to sleep. After consulting with H.Ratcliffe PA-C advised pt she may take a whole Zoloft but do not expect it to knock her out. Efficacy and length of time on medication reviewed with pt. Further advised to cut out all stimulus, i.e. caffeine, TV, phone, etc at least 1 hour prior to bed, and recommended a nighttime hot tea possibly with chamomile/lavender to help relax and sleep. Encouraged Ms.Dauphinais to continue to take medication as prescribed, try these suggestions and sleep study has been ordered and they will contact her to arrange study. Pt verbalizes understanding and has no further needs or concerns at this time.

## 2019-02-09 ENCOUNTER — Telehealth: Payer: Self-pay

## 2019-02-09 NOTE — Telephone Encounter (Signed)
Patient contacted our office today c/o difficulty sleeping.  Pt had been seen in the office earlier this week for evaluation.  She was instructed to start taking Zoloft 25 mg daily during that visit.She was compliant in taking Zoloft but she also started taking Benadryl 1/2 tablet in addition to the Zoloft.  Pt is now experiencing "feeling loopy" and dizziness.Instructed her to stop taking Zoloft and Benadryl for now and we will evaluate her in one week. She is scheduled and will follow up with a virtual visit next week.

## 2019-02-13 ENCOUNTER — Encounter: Payer: Self-pay | Admitting: Medical

## 2019-02-13 DIAGNOSIS — F419 Anxiety disorder, unspecified: Secondary | ICD-10-CM

## 2019-02-13 DIAGNOSIS — G4700 Insomnia, unspecified: Secondary | ICD-10-CM | POA: Insufficient documentation

## 2019-02-13 HISTORY — DX: Anxiety disorder, unspecified: F41.9

## 2019-02-15 ENCOUNTER — Telehealth: Payer: Self-pay | Admitting: Medical

## 2019-02-15 ENCOUNTER — Other Ambulatory Visit: Payer: Self-pay

## 2019-02-15 NOTE — Progress Notes (Signed)
  Patient gives permission for me to talk to her through telemedicine.   Went to bed about  10-10:30 pm after taking .Took Zoloft 1/2 tablet. Was up for  3 hours before going to sleep. Same  2nd  Day    Took Benadryl 25 mg po with the Zoloft 1/2 tablet 25mg . .The next morning she felt dizzy and  "out of it". And her eyes were dialated. Explained to patient that Benadryl does have that side effect.    Patient also utilizing Doterra Serenity, (restful complex), oral capsule.   Her symptoms are as follows: Some times has trouble falling asleep, takes hours 2-4 hours. Waking up at 3am , returned back to sleep 4:30 to 5:30am then woke up, continues to  wake up in the middle of the night Gets up walks around goes back to bed and then tries to go back to sleep.Still has difficulty falling asleep. Complains of .  daytime fatigue and irriability.    Physical exam none performed due to telemedicine appointment.   Dx Sleep disturbances, Insomnia Set up for sleep study. To stop all medication till next telemedicine appointment on 02/20/2019 at 9am. We will revisit the Zoloft at that time..  She verbalizes understanding and has no questions at the end of our conversation.  Any further concerns to contact me by calling  the office.

## 2019-02-20 ENCOUNTER — Telehealth: Payer: Self-pay | Admitting: Medical

## 2019-02-20 DIAGNOSIS — G4700 Insomnia, unspecified: Secondary | ICD-10-CM

## 2019-02-20 NOTE — Progress Notes (Signed)
Given permission ;by Anderson Malta to do a telemedicine appointment.   She stopped the Zoloft and the Benadryl as instructed. She continues to have problems falling asleep and staying asleep.  For instance she gave me the example of  Friday, Saturday and Sunday. She states she is getting between for  3-4 hours /night of sleep.   Saturday she fell asleep after going to sleep at  10:45pm and then woke up at  12:52am returned to sleep at  2am and then worke up at  6:10pm. Sunday could not fall asleep till 3am and then had to wake up for work at 6:30am. She is still trying Melatonin which initially helped but now does not seem to be working.  Do to her symptoms of grogginess/ dizziness when she took both Benadryl and 1/2 Zoloft. I stopped all medication x 7 days.  She will start Zoloft at 1/4 tab x 7 days then increase to  1/2 tablet x 7 days then 3/4 tablet x 7 days then titrate up to a whole tablet.  If she gets any side effects of sleepiness in ;the am from the medication she is to call me and stop the medication. I reviewed the black box warning of suicidal thoughts or tendencies or or any unusual thoughts she is to stop the medication and call me.  PE none performed due to patient appointment through telemedicine.  Dx: sleep diturbances. Insomnia  Offered a work note if she felt like she could not work, she declines at this time.    She verbalizes understanding and has no questions at the end of our conversation.    She did mention she had a friend suggest maybe see a psychiatrist for sleeping trouble. I agree with this and will set her up a referral.She actually found a group in [place], behavioral health services. She is going to call and set up an appointment. She will call me back and let me know if she gets an appointment. I asked that she let them know about her insurance status to see if they will accept her [employer].

## 2019-02-21 ENCOUNTER — Telehealth: Payer: BC Managed Care – PPO | Admitting: Medical

## 2019-02-21 NOTE — Progress Notes (Signed)
Permission to do telemedicine appointment per patient.   She states she did get in touch with Psychiatry and that they could see her in a month. However they called to day and she had an appointment opening and could fit her in at  10 am.   She states she was interviewed in an office appointment and then given some tests ( she is not sure of what). They started her on Respiradol. And Melatonin starting at  5mg  and to titrate up to 10mg . Stop the Zoloft for now.   No PE due to telemedicine appointment.  DX sleep disturbances. She seemed pleased with the plan and has a follow up virtual appointment in  2 weeks.  I reviewed with the patient if she has any concerns or needs anything to please call me in the office. l  She verbalizes agreement and has no more questions at the end of our conversation.

## 2019-03-01 ENCOUNTER — Other Ambulatory Visit: Payer: Self-pay | Admitting: Medical

## 2019-03-01 DIAGNOSIS — F419 Anxiety disorder, unspecified: Secondary | ICD-10-CM

## 2019-03-08 ENCOUNTER — Other Ambulatory Visit: Payer: Self-pay | Admitting: Medical

## 2019-04-03 ENCOUNTER — Encounter: Payer: Self-pay | Admitting: *Deleted

## 2019-04-11 ENCOUNTER — Encounter: Payer: Self-pay | Admitting: *Deleted

## 2019-04-26 ENCOUNTER — Other Ambulatory Visit: Payer: Self-pay | Admitting: Medical

## 2019-04-26 DIAGNOSIS — G479 Sleep disorder, unspecified: Secondary | ICD-10-CM

## 2019-04-26 DIAGNOSIS — G4700 Insomnia, unspecified: Secondary | ICD-10-CM

## 2019-04-26 NOTE — Progress Notes (Signed)
Sleep Med  Asks for a different provider to be selected , patient wants to do home study. [facility] Dr. Richardson Landry does preform these per his website.

## 2019-04-28 ENCOUNTER — Encounter: Payer: Self-pay | Admitting: Medical

## 2019-05-11 ENCOUNTER — Other Ambulatory Visit: Payer: Self-pay | Admitting: Obstetrics and Gynecology

## 2019-05-11 DIAGNOSIS — Z1231 Encounter for screening mammogram for malignant neoplasm of breast: Secondary | ICD-10-CM

## 2019-05-12 ENCOUNTER — Encounter: Payer: Self-pay | Admitting: Medical

## 2019-05-22 ENCOUNTER — Telehealth: Payer: Self-pay | Admitting: Medical

## 2019-05-22 DIAGNOSIS — J301 Allergic rhinitis due to pollen: Secondary | ICD-10-CM

## 2019-05-22 MED ORDER — AZELASTINE HCL 0.1 % NA SOLN: 2.0000 | Freq: Two times a day (BID) | NASAL | 11 refills | Status: DC

## 2019-05-22 NOTE — Telephone Encounter (Signed)
Patient received a sleep study kit from Dr. Richardson Pineda. She called her insurance company and she was not preauthorized to use this equipment for a sleep study. She decided to send back the equipment. Holly Pineda had cancelled her consultation appointment with Dr. Richardson Pineda thinking she did not need an appointment with him, because she saw me about her sleep study. Explained that she needs a consultation/evaluation for the actual test to be ordered.  The other issue is Dr. Richardson Pineda was put as the referring doctor and the referral Doctor.  Which was incorrect.  I am the referring doctor.   Patient frustrated by the whole situation and deciding not to follow thorough   with the sleep study at this time. Her psychiatris Dr.Spencer Pineda has stopped her Zoloft and Restoril and is nowrtreating her with   Seroquel 100 mg nightly, in which she states she is sleeping better now.   I recommended that at any time she would like to pursue the sleep study she may contact me or Dr. Katherina Pineda office ( referral already sent over to his clinic)..  She verbalizes understanding and has no questions at the end of our conversation.

## 2019-06-14 ENCOUNTER — Ambulatory Visit
Admission: RE | Admit: 2019-06-14 | Discharge: 2019-06-14 | Disposition: A | Discharge status: Home / Self Care | Payer: BC Managed Care – PPO | Source: Ambulatory Visit | Attending: Obstetrics and Gynecology | Admitting: Obstetrics and Gynecology

## 2019-06-14 DIAGNOSIS — Z1231 Encounter for screening mammogram for malignant neoplasm of breast: Secondary | ICD-10-CM | POA: Insufficient documentation

## 2019-06-20 ENCOUNTER — Other Ambulatory Visit: Payer: Self-pay | Admitting: Obstetrics and Gynecology

## 2019-06-20 DIAGNOSIS — R928 Other abnormal and inconclusive findings on diagnostic imaging of breast: Secondary | ICD-10-CM

## 2019-06-20 DIAGNOSIS — N6320 Unspecified lump in the left breast, unspecified quadrant: Secondary | ICD-10-CM

## 2019-06-28 ENCOUNTER — Ambulatory Visit
Admission: RE | Admit: 2019-06-28 | Discharge: 2019-06-28 | Disposition: A | Discharge status: Home / Self Care | Payer: BC Managed Care – PPO | Source: Ambulatory Visit | Attending: Obstetrics and Gynecology | Admitting: Obstetrics and Gynecology

## 2019-06-28 ENCOUNTER — Other Ambulatory Visit: Payer: Self-pay | Admitting: Obstetrics and Gynecology

## 2019-06-28 DIAGNOSIS — R928 Other abnormal and inconclusive findings on diagnostic imaging of breast: Secondary | ICD-10-CM | POA: Diagnosis not present

## 2019-06-28 DIAGNOSIS — N6320 Unspecified lump in the left breast, unspecified quadrant: Secondary | ICD-10-CM

## 2019-07-05 ENCOUNTER — Other Ambulatory Visit: Payer: Self-pay | Admitting: Medical

## 2019-07-05 DIAGNOSIS — Z0000 Encounter for general adult medical examination without abnormal findings: Secondary | ICD-10-CM

## 2019-07-05 NOTE — Progress Notes (Signed)
6 month follow up complete labs.

## 2019-07-17 ENCOUNTER — Ambulatory Visit: Payer: BC Managed Care – PPO | Admitting: Medical

## 2019-07-17 ENCOUNTER — Encounter: Payer: Self-pay | Admitting: Medical

## 2019-07-17 ENCOUNTER — Other Ambulatory Visit: Payer: Self-pay

## 2019-07-17 VITALS — BP 116/68 | HR 92 | Temp 98.4°F | Resp 16 | Ht 59.0 in | Wt 99.2 lb

## 2019-07-17 DIAGNOSIS — G4700 Insomnia, unspecified: Secondary | ICD-10-CM

## 2019-07-17 DIAGNOSIS — Z889 Allergy status to unspecified drugs, medicaments and biological substances status: Secondary | ICD-10-CM

## 2019-07-17 DIAGNOSIS — Z0000 Encounter for general adult medical examination without abnormal findings: Secondary | ICD-10-CM

## 2019-07-17 NOTE — Progress Notes (Signed)
Subjective:    Patient ID: Holly Pineda, female    DOB: 05/25/1981, 38 y.o.   MRN: [id number]  HPI  38 yo female in non acute distress here for annual physical exam. No complaints today.Works in  [place] lives with  69 yo son, marriend but not living together.  Also has  A 63 yo daughter.  Sees   Dr. Patriciaann Clan Psychiatry, for sleeping, she is now getting  7-8 hours of sleep at night with taking Seroquel and Belsomra .  Quit smoking 1 year ago. wieghs 99 height  4" 11"  Blood pressure 116/68, pulse 92, temperature 98.4 F (36.9 C), temperature source Tympanic, resp. rate 16, height 4\' 11"  (1.499 m), weight 99 lb 3.2 oz (45 kg), SpO2 100 %.  No Known Allergies  Current Outpatient Medications:  .  azelastine (ASTELIN) 0.1 % nasal spray, Place 2 sprays into both nostrils 2 (two) times daily. Use in each nostril as directed, Disp: 30 mL, Rfl: 11 .  fexofenadine (ALLEGRA) 180 MG tablet, Take 1 tablet (180 mg total) by mouth daily., Disp: 30 tablet, Rfl: 0 .  levonorgestrel-ethinyl estradiol (ALESSE) 0.1-20 MG-MCG tablet, Take 1 tablet by mouth daily., Disp: , Rfl:  .  Multiple Vitamin (MULTIVITAMIN) capsule, Take 1 capsule by mouth daily., Disp: , Rfl:  .  QUEtiapine (SEROQUEL) 50 MG tablet, Take 100 mg by mouth at bedtime., Disp: , Rfl:  .  tretinoin (RETIN-A) 0.05 % cream, , Disp: , Rfl:  .  BELSOMRA 10 MG TABS, Take 1 tablet by mouth at bedtime., Disp: , Rfl:     Past Medical History:  Diagnosis Date  . Allergy   . Anxiety 02/13/2019  Insomnia  Past Surgical History:  Procedure Laterality Date  . AUGMENTATION MAMMAPLASTY Bilateral 2017  . CESAREAN SECTION    . CHOLECYSTECTOMY    . Family History  Problem Relation Age of Onset  . Dementia Maternal Grandmother   . Liver cancer Paternal Grandmother   . Breast cancer Neg Hx     Social History   Socioeconomic History  . Marital status: Married    Spouse name: Not on file  . Number of children: Not on file  .  Years of education: Not on file  . Highest education level: Not on file  Occupational History  . Not on file  Tobacco Use  . Smoking status: Former Smoker    Packs/day: 1.00    Years: 20.00    Pack years: 20.00    Types: Cigarettes    Quit date: 08/18/2018    Years since quitting: 0.9  . Smokeless tobacco: Never Used  Substance and Sexual Activity  . Alcohol use: No  . Drug use: No  . Sexual activity: Yes    Birth control/protection: None, Condom  Other Topics Concern  . Not on file  Social History Narrative  . Not on file   Social Determinants of Health   Financial Resource Strain:   . Difficulty of Paying Living Expenses: Not on file  Food Insecurity:   . Worried About Charity fundraiser in the Last Year: Not on file  . Ran Out of Food in the Last Year: Not on file  Transportation Needs:   . Lack of Transportation (Medical): Not on file  . Lack of Transportation (Non-Medical): Not on file  Physical Activity:   . Days of Exercise per Week: Not on file  . Minutes of Exercise per Session: Not on file  Stress:   .  Feeling of Stress : Not on file  Social Connections:   . Frequency of Communication with Friends and Family: Not on file  . Frequency of Social Gatherings with Friends and Family: Not on file  . Attends Religious Services: Not on file  . Active Member of Clubs or Organizations: Not on file  . Attends Archivist Meetings: Not on file  . Marital Status: Not on file  Intimate Partner Violence:   . Fear of Current or Ex-Partner: Not on file  . Emotionally Abused: Not on file  . Physically Abused: Not on file  . Sexually Abused: Not on file     Review of Systems  Constitutional: Negative.   HENT: Positive for congestion ( last week none now), ear pain (sometimes with congestion and sinus pressure) and sinus pressure.   Eyes: Negative.   Respiratory: Negative.   Cardiovascular: Negative.   Gastrointestinal: Negative.   Endocrine: Negative.    Genitourinary: Negative.   Musculoskeletal: Negative.   Skin: Negative.   Allergic/Immunologic: Positive for environmental allergies. Negative for food allergies and immunocompromised state.  Neurological: Positive for headaches ( x 2 days advil gives relief). Negative for dizziness, tremors, seizures, syncope, facial asymmetry, speech difficulty, light-headedness and numbness.  Hematological: Negative.   Psychiatric/Behavioral: Negative.        Objective:   Physical Exam Vitals and nursing note reviewed.  Constitutional:      Appearance: Normal appearance. She is well-developed and normal weight.  HENT:     Head: Normocephalic and atraumatic.     Jaw: There is normal jaw occlusion.     Right Ear: Hearing, ear canal and external ear normal. A middle ear effusion is present.     Left Ear: Hearing, ear canal and external ear normal. A middle ear effusion is present.     Nose: Nose normal. No rhinorrhea.     Right Turbinates: Not enlarged, swollen or pale.     Left Turbinates: Not enlarged, swollen or pale.     Mouth/Throat:     Lips: Pink.     Mouth: Mucous membranes are moist.     Pharynx: Oropharynx is clear.  Eyes:     General: Lids are normal. Vision grossly intact.        Right eye: No discharge.        Left eye: No discharge.     Extraocular Movements: Extraocular movements intact.     Right eye: Normal extraocular motion and no nystagmus.     Left eye: Normal extraocular motion and no nystagmus.     Conjunctiva/sclera: Conjunctivae normal.     Pupils: Pupils are equal, round, and reactive to light.     Right eye: Pupil is round and reactive.     Left eye: Pupil is round and reactive.     Funduscopic exam:    Right eye: No hemorrhage or exudate. Red reflex present.        Left eye: No hemorrhage or exudate. Red reflex present. Neck:     Thyroid: No thyroid mass, thyromegaly or thyroid tenderness.     Vascular: No JVD.     Trachea: Trachea and phonation normal.   Cardiovascular:     Rate and Rhythm: Normal rate and regular rhythm.     Pulses: Normal pulses.          Carotid pulses are 2+ on the right side and 2+ on the left side.      Radial pulses are 2+ on the right side  and 2+ on the left side.       Dorsalis pedis pulses are 2+ on the right side and 2+ on the left side.       Posterior tibial pulses are 2+ on the right side and 2+ on the left side.     Heart sounds: Normal heart sounds, S1 normal and S2 normal.  Pulmonary:     Effort: Pulmonary effort is normal.     Breath sounds: Normal breath sounds.  Abdominal:     General: Abdomen is flat. Bowel sounds are normal.     Palpations: Abdomen is soft.     Tenderness: There is no abdominal tenderness.  Musculoskeletal:        General: Normal range of motion.     Cervical back: Normal range of motion and neck supple. No rigidity or tenderness. No pain with movement. Normal range of motion.     Right lower leg: No edema.     Left lower leg: No edema.     Right foot: Normal range of motion. No deformity, bunion, Charcot foot, foot drop or prominent metatarsal heads.     Left foot: Normal range of motion. No deformity, bunion, Charcot foot, foot drop or prominent metatarsal heads.  Feet:     Right foot:     Skin integrity: Skin integrity normal.     Toenail Condition: Right toenails are normal.     Left foot:     Skin integrity: Skin integrity normal.     Toenail Condition: Left toenails are normal.  Lymphadenopathy:     Head:     Right side of head: No submental, submandibular, tonsillar, preauricular, posterior auricular or occipital adenopathy.     Left side of head: No submental, submandibular, tonsillar, preauricular, posterior auricular or occipital adenopathy.     Cervical: No cervical adenopathy.     Right cervical: No superficial, deep or posterior cervical adenopathy.    Left cervical: No superficial, deep or posterior cervical adenopathy.     Upper Body:     Right upper body:  No supraclavicular or epitrochlear adenopathy.     Left upper body: No supraclavicular or epitrochlear adenopathy.  Skin:    General: Skin is warm and dry.     Capillary Refill: Capillary refill takes less than 2 seconds.  Neurological:     General: No focal deficit present.     Mental Status: She is alert and oriented to person, place, and time.     GCS: GCS eye subscore is 4. GCS verbal subscore is 5. GCS motor subscore is 6.     Cranial Nerves: Cranial nerves are intact.     Sensory: Sensation is intact.     Motor: Motor function is intact.     Coordination: Romberg sign negative. Coordination normal. Finger-Nose-Finger Test and Heel to [facility] Test normal. Rapid alternating movements normal.     Gait: Gait is intact.     Deep Tendon Reflexes: Reflexes are normal and symmetric.     Reflex Scores:      Brachioradialis reflexes are 2+ on the right side and 2+ on the left side.      Patellar reflexes are 2+ on the right side and 2+ on the left side.      Achilles reflexes are 2+ on the right side and 2+ on the left side. Psychiatric:        Attention and Perception: Attention and perception normal.        Mood and Affect:  Mood and affect normal.        Speech: Speech normal.        Behavior: Behavior normal. Behavior is cooperative.        Thought Content: Thought content normal.        Cognition and Memory: Cognition normal.        Judgment: Judgment normal.   5/5 strength upper and lower extremities bilateral No breast exam patient sees OB/GYN  R<L eustachian tube dysfunction.  Sees Sherrie George for  OB/GYN well women exam and pap last seen on 05/11/2019. Visual acuity 20/30 L , 20/30 R, both 20/30  Assessment & Plan:  Annual Physical exam Insomnia controlled by Seroquel and Belstomra Seasonal allergies , to continue Allegra and Flonase as directed. Labs due. She will come to an appointment at  7:45 am tomorrow. Pap smear and well women exam UTD 05/11/2019. Urine  pos 07/18/19 for blood due to patient finishing her mensis. Will review labs with patient once I receive results.Sent MyChart message to offer patient  Influenza vaccine. Patient verbalizes understanding and has no questions at discharge.

## 2019-07-18 ENCOUNTER — Other Ambulatory Visit: Payer: BC Managed Care – PPO | Admitting: Medical

## 2019-07-18 ENCOUNTER — Other Ambulatory Visit: Payer: BC Managed Care – PPO

## 2019-07-18 ENCOUNTER — Telehealth: Payer: Self-pay | Admitting: Medical

## 2019-07-18 ENCOUNTER — Encounter: Payer: Self-pay | Admitting: Medical

## 2019-07-18 DIAGNOSIS — Z0000 Encounter for general adult medical examination without abnormal findings: Secondary | ICD-10-CM

## 2019-07-18 LAB — POCT URINALYSIS DIPSTICK
Bilirubin, UA: NEGATIVE
Glucose, UA: NEGATIVE
Ketones, UA: NEGATIVE
Leukocytes, UA: NEGATIVE
Nitrite, UA: NEGATIVE
Protein, UA: NEGATIVE
Spec Grav, UA: 1.02 (ref 1.010–1.025)
Urobilinogen, UA: 0.2 E.U./dL
pH, UA: 5 (ref 5.0–8.0)

## 2019-07-18 NOTE — Telephone Encounter (Signed)
Patient started her period on 8128 then got heavier 12 9 and 12/10., finishing period now. This is the reason she has blood in her urine. No urinary symptoms.

## 2019-07-19 ENCOUNTER — Other Ambulatory Visit: Payer: Self-pay | Admitting: Medical

## 2019-07-19 DIAGNOSIS — R899 Unspecified abnormal finding in specimens from other organs, systems and tissues: Secondary | ICD-10-CM

## 2019-07-19 LAB — CMP12+LP+TP+TSH+6AC+CBC/D/PLT
ALT: 11 IU/L (ref 0–32)
AST: 17 IU/L (ref 0–40)
Albumin/Globulin Ratio: 2.2 (ref 1.2–2.2)
Albumin: 4.6 g/dL (ref 3.8–4.8)
Alkaline Phosphatase: 50 IU/L (ref 39–117)
BUN/Creatinine Ratio: 11 (ref 9–23)
BUN: 9 mg/dL (ref 6–24)
Basophils Absolute: 0 10*3/uL (ref 0.0–0.2)
Basos: 1 %
Bilirubin Total: <0.2 mg/dL (ref 0.0–1.2)
Calcium: 9.2 mg/dL (ref 8.7–10.2)
Chloride: 104 mmol/L (ref 96–106)
Chol/HDL Ratio: 2.7 ratio (ref 0.0–4.4)
Cholesterol, Total: 172 mg/dL (ref 100–199)
Creatinine, Ser: 0.83 mg/dL (ref 0.57–1.00)
EOS (ABSOLUTE): 0.2 10*3/uL (ref 0.0–0.4)
Eos: 4 %
Estimated CHD Risk: <0.5 times avg. (ref 0.0–1.0)
Free Thyroxine Index: 1.5 (ref 1.2–4.9)
GFR calc Af Amer: 102 mL/min/{1.73_m2} (ref >59)
GFR calc non Af Amer: 88 mL/min/{1.73_m2} (ref >59)
GGT: 8 IU/L (ref 0–60)
Globulin, Total: 2.1 g/dL (ref 1.5–4.5)
Glucose: 80 mg/dL (ref 65–99)
HDL: 64 mg/dL (ref >39)
Hematocrit: 37.9 % (ref 34.0–46.6)
Hemoglobin: 12.9 g/dL (ref 11.1–15.9)
Immature Grans (Abs): 0 10*3/uL (ref 0.0–0.1)
Immature Granulocytes: 0 %
Iron: 51 ug/dL (ref 27–159)
LDH: 247 IU/L — ABNORMAL HIGH (ref 119–226)
LDL Chol Calc (NIH): 89 mg/dL (ref 0–99)
Lymphocytes Absolute: 2 10*3/uL (ref 0.7–3.1)
Lymphs: 40 %
MCH: 30.4 pg (ref 26.6–33.0)
MCHC: 34 g/dL (ref 31.5–35.7)
MCV: 89 fL (ref 79–97)
Monocytes Absolute: 0.4 10*3/uL (ref 0.1–0.9)
Monocytes: 7 %
Neutrophils Absolute: 2.5 10*3/uL (ref 1.4–7.0)
Neutrophils: 48 %
Phosphorus: 4.1 mg/dL (ref 3.0–4.3)
Platelets: 230 10*3/uL (ref 150–450)
Potassium: 4.3 mmol/L (ref 3.5–5.2)
RBC: 4.24 x10E6/uL (ref 3.77–5.28)
RDW: 11.6 % — ABNORMAL LOW (ref 11.7–15.4)
Sodium: 138 mmol/L (ref 134–144)
T3 Uptake Ratio: 26 % (ref 24–39)
T4, Total: 5.7 ug/dL (ref 4.5–12.0)
TSH: 6.14 u[IU]/mL — ABNORMAL HIGH (ref 0.450–4.500)
Total Protein: 6.7 g/dL (ref 6.0–8.5)
Triglycerides: 104 mg/dL (ref 0–149)
Uric Acid: 3.3 mg/dL (ref 2.6–6.2)
VLDL Cholesterol Cal: 19 mg/dL (ref 5–40)
WBC: 5.1 10*3/uL (ref 3.4–10.8)

## 2019-07-19 LAB — VITAMIN D 25 HYDROXY (VIT D DEFICIENCY, FRACTURES): Vit D, 25-Hydroxy: 45 ng/mL (ref 30.0–100.0)

## 2019-07-19 LAB — HGB A1C W/O EAG: Hgb A1c MFr Bld: 5 % (ref 4.8–5.6)

## 2019-07-19 NOTE — Progress Notes (Signed)
Recheck of patient's TSH Jan 2021. Orders in [place]. Reviewed labs and patient aware. She verbalizes understanding and has no questions at the end of our conversation. Orders Placed This Encounter  Procedures  . Thyroid Panel With TSH    Standing Status:   Future    Standing Expiration Date:   07/18/2020

## 2019-07-21 ENCOUNTER — Ambulatory Visit: Payer: BC Managed Care – PPO

## 2019-07-21 ENCOUNTER — Other Ambulatory Visit: Payer: Self-pay

## 2019-08-16 ENCOUNTER — Other Ambulatory Visit: Payer: Self-pay

## 2019-08-16 DIAGNOSIS — R899 Unspecified abnormal finding in specimens from other organs, systems and tissues: Secondary | ICD-10-CM

## 2019-08-17 LAB — THYROID PANEL WITH TSH
Free Thyroxine Index: 1.6 (ref 1.2–4.9)
T3 Uptake Ratio: 26 % (ref 24–39)
T4, Total: 6 ug/dL (ref 4.5–12.0)
TSH: 5.42 u[IU]/mL — ABNORMAL HIGH (ref 0.450–4.500)

## 2019-08-22 ENCOUNTER — Encounter: Payer: Self-pay | Admitting: Medical

## 2019-09-26 ENCOUNTER — Other Ambulatory Visit: Payer: Self-pay | Admitting: Obstetrics and Gynecology

## 2019-09-26 DIAGNOSIS — N6320 Unspecified lump in the left breast, unspecified quadrant: Secondary | ICD-10-CM

## 2019-10-26 ENCOUNTER — Ambulatory Visit
Admission: RE | Admit: 2019-10-26 | Discharge: 2019-10-26 | Disposition: A | Discharge status: Home / Self Care | Payer: BC Managed Care – PPO | Source: Ambulatory Visit | Attending: Obstetrics and Gynecology | Admitting: Obstetrics and Gynecology

## 2019-10-26 DIAGNOSIS — N6320 Unspecified lump in the left breast, unspecified quadrant: Secondary | ICD-10-CM

## 2019-10-31 ENCOUNTER — Other Ambulatory Visit: Payer: Self-pay

## 2019-11-08 ENCOUNTER — Telehealth: Payer: Self-pay | Admitting: Medical

## 2019-11-08 DIAGNOSIS — R899 Unspecified abnormal finding in specimens from other organs, systems and tissues: Secondary | ICD-10-CM

## 2019-11-08 NOTE — Telephone Encounter (Signed)
Orders placed for rechecked last check was 3 months ago.

## 2019-11-09 ENCOUNTER — Other Ambulatory Visit: Payer: Self-pay

## 2019-11-09 DIAGNOSIS — R899 Unspecified abnormal finding in specimens from other organs, systems and tissues: Secondary | ICD-10-CM

## 2019-11-10 LAB — TSH: TSH: 7.47 u[IU]/mL — ABNORMAL HIGH (ref 0.450–4.500)

## 2019-11-20 ENCOUNTER — Telehealth: Payer: Self-pay | Admitting: Medical

## 2019-11-20 DIAGNOSIS — R7989 Other specified abnormal findings of blood chemistry: Secondary | ICD-10-CM

## 2019-11-20 DIAGNOSIS — R899 Unspecified abnormal finding in specimens from other organs, systems and tissues: Secondary | ICD-10-CM

## 2019-11-21 ENCOUNTER — Other Ambulatory Visit: Payer: Self-pay

## 2019-11-21 ENCOUNTER — Encounter: Payer: Self-pay | Admitting: Medical

## 2019-11-21 NOTE — Progress Notes (Signed)
Reviewed labs with Anderson Malta. Elevated TSH.Long discussion of causes os elevated TSH. Nine months ago TSH was 3.730, then was slightly elevated but then decreased.  Last labs TSH on 11/09/19 was 7.470. She is now taking Seroquel for sleep, seen by psychiatry (on medication since August 2020). This medication can affect TSH levels. She will discuss with her prescribing doctor and then contact me and let me know the plan. If she stays on the Seroquel, I recommend her to start taking Generic  Synthroid.   The prescriber for Belsomra and Seroquel were not IT sales professional or [employer], as listed in the computer.  Patient verbalizes understanding and has no questions at the end of our conversation.

## 2019-11-27 ENCOUNTER — Encounter: Payer: Self-pay | Admitting: Medical

## 2019-11-27 ENCOUNTER — Other Ambulatory Visit: Payer: Self-pay

## 2019-11-27 ENCOUNTER — Telehealth: Payer: BC Managed Care – PPO | Admitting: Medical

## 2019-11-27 DIAGNOSIS — R7989 Other specified abnormal findings of blood chemistry: Secondary | ICD-10-CM

## 2019-11-27 NOTE — Progress Notes (Signed)
Patient seen by her psychiatrist on  11/23/19, her regular f/u appointment.  Because of TSH increasing , they are titrating her off of Seroquel and patient  Started treatment with Trazodone ( with the ability for the patient to titrate the medication higher) for sleep.   Instructions per patient: Trazodone 50mg   75 mg or 100mg  Started on Saturday 11/25/19 Try Seroquel 25 mg x 3 nights then the fourth night  Stop Seroquel and take 50mg  of Trazodone.  We will recheck her TSH in 3 months. (02/28/2020). Orders Placed This Encounter  Procedures  . TSH    Standing Status:   Future    Standing Expiration Date:   11/26/2020   Patient verbalizes understanding and has no questions at the end of our conversation. She does know if she has any concerns to contact me through phone or MyChart.

## 2019-12-06 ENCOUNTER — Encounter: Payer: Self-pay | Admitting: Dermatology

## 2019-12-12 ENCOUNTER — Ambulatory Visit: Payer: BC Managed Care – PPO | Attending: Internal Medicine

## 2019-12-12 DIAGNOSIS — Z23 Encounter for immunization: Secondary | ICD-10-CM

## 2019-12-12 NOTE — Progress Notes (Signed)
   Covid-19 Vaccination Clinic  Name:  Holly Pineda    MRN: [id number] DOB: 1980-10-19  12/12/2019  [name] was observed post Covid-19 immunization for 15 minutes without incident. She was provided with Vaccine Information Sheet and instruction to access the V-Safe system.   [name] was instructed to call 911 with any severe reactions post vaccine: Marland Kitchen Difficulty breathing  . Swelling of face and throat  . A fast heartbeat  . A bad rash all over body  . Dizziness and weakness   Immunizations Administered    Name Date Dose VIS Date Route   Pfizer COVID-19 Vaccine 12/12/2019  3:52 PM 0.3 mL 09/27/2018 Intramuscular   Manufacturer: [place]   Lot: [id number]   [place]: [id number]

## 2020-01-02 ENCOUNTER — Ambulatory Visit: Payer: BC Managed Care – PPO | Attending: Internal Medicine

## 2020-01-02 DIAGNOSIS — Z23 Encounter for immunization: Secondary | ICD-10-CM

## 2020-01-02 NOTE — Progress Notes (Signed)
   Covid-19 Vaccination Clinic  Name:  Holly Pineda    MRN: [id number] DOB: 13-Dec-1980  01/02/2020  [name] was observed post Covid-19 immunization for 15 minutes without incident. She was provided with Vaccine Information Sheet and instruction to access the V-Safe system.   [name] was instructed to call 911 with any severe reactions post vaccine: Marland Kitchen Difficulty breathing  . Swelling of face and throat  . A fast heartbeat  . A bad rash all over body  . Dizziness and weakness   Immunizations Administered    Name Date Dose VIS Date Route   Pfizer COVID-19 Vaccine 01/02/2020  3:32 PM 0.3 mL 09/27/2018 Intramuscular   Manufacturer: [place]   Lot: [id number]   [place]: [id number]

## 2020-01-08 ENCOUNTER — Other Ambulatory Visit: Payer: Self-pay

## 2020-01-08 ENCOUNTER — Ambulatory Visit (INDEPENDENT_AMBULATORY_CARE_PROVIDER_SITE_OTHER): Payer: Self-pay

## 2020-01-08 DIAGNOSIS — I781 Nevus, non-neoplastic: Secondary | ICD-10-CM

## 2020-01-08 DIAGNOSIS — L730 Acne keloid: Secondary | ICD-10-CM

## 2020-02-28 ENCOUNTER — Other Ambulatory Visit: Payer: Self-pay

## 2020-02-28 ENCOUNTER — Other Ambulatory Visit: Payer: BC Managed Care – PPO

## 2020-02-28 DIAGNOSIS — R7989 Other specified abnormal findings of blood chemistry: Secondary | ICD-10-CM

## 2020-02-29 LAB — TSH: TSH: 7.33 u[IU]/mL — ABNORMAL HIGH (ref 0.450–4.500)

## 2020-03-05 ENCOUNTER — Encounter: Payer: Self-pay | Admitting: Medical

## 2020-03-06 ENCOUNTER — Telehealth: Payer: Self-pay | Admitting: Medical

## 2020-03-06 MED ORDER — LEVOTHYROXINE SODIUM 25 MCG PO CAPS: 25.0000 ug | ORAL_CAPSULE | Freq: Every day | ORAL | 2 refills | Status: DC

## 2020-03-06 NOTE — Telephone Encounter (Signed)
Patient wanted to know if levothyroxine caused women to loode her hear. She was going on vaction the end of this wee and wanted to be able to not transisition into a new medication because of up coming vacation next week. I agreed with patient. She verbalizes understanding and has no questihe end [place] our conversation.

## 2020-03-08 ENCOUNTER — Other Ambulatory Visit: Payer: Self-pay | Admitting: Medical

## 2020-03-13 ENCOUNTER — Ambulatory Visit: Payer: Self-pay | Admitting: Dermatology

## 2020-03-14 ENCOUNTER — Ambulatory Visit: Payer: Self-pay | Admitting: Dermatology

## 2020-03-27 ENCOUNTER — Ambulatory Visit (INDEPENDENT_AMBULATORY_CARE_PROVIDER_SITE_OTHER): Payer: Self-pay | Admitting: Dermatology

## 2020-03-27 ENCOUNTER — Other Ambulatory Visit: Payer: Self-pay

## 2020-03-27 DIAGNOSIS — L988 Other specified disorders of the skin and subcutaneous tissue: Secondary | ICD-10-CM

## 2020-03-27 NOTE — Patient Instructions (Signed)
May use over the counter Arnica cream four times a day to help with bruising.

## 2020-03-27 NOTE — Progress Notes (Signed)
Follow-Up Visit   Subjective  Holly Pineda is a 39 y.o. female who presents for the following: Facial Elastosis.  The following portions of the chart were reviewed this encounter and updated as appropriate:  Tobacco   Allergies   Meds   Problems   Med Hx   Surg Hx   Fam Hx       Review of Systems:  No other skin or systemic complaints except as noted in HPI or Assessment and Plan.  Objective  Well appearing patient in no apparent distress; mood and affect are within normal limits.  A focused examination was performed including face. Relevant physical exam findings are noted in the Assessment and Plan.  Objective  face: Rhytides and volume loss.   Images                         Assessment & Plan  Elastosis of skin face  1.25mL Voluma injected today mid cheek 5 units of Botox injected today to forehead.   Discussed adding 6 units Botox at each crows feet, 1 unit under each eye and adding Volbella .55cc under eyes.     Botox Injection - face Location: See attached image  Informed consent: Discussed risks (infection, pain, bleeding, bruising, swelling, allergic reaction, paralysis of nearby muscles, eyelid droop, double vision, neck weakness, difficulty breathing, headache, undesirable cosmetic result, and need for additional treatment) and benefits of the procedure, as well as the alternatives.  Informed consent was obtained.  Preparation: The area was cleansed with alcohol.  Procedure Details:  Botox was injected into the dermis with a 30-gauge needle. Pressure applied to any bleeding. Ice packs offered for swelling.  Lot Number:  [id number]  Expiration:  06/2022  Total Units Injected:  5  Plan: Patient was instructed to remain upright for 4 hours. Patient was instructed to avoid massaging the face and avoid vigorous exercise for the rest of the day. Tylenol may be used for headache.  Allow 2 weeks before returning to clinic for  additional dosing as needed. Patient will call for any problems.   Botox Injection - face Prior to the procedure, the patient's past medical history, allergies and the rare but potential risks and complications were reviewed with the patient and a signed consent was obtained. Pre and post-treatment care was discussed and instructions provided.  Location: cheeks  Filler Type: Juvederm Voluma  Procedure: The area was prepped thoroughly with hibiclens The area was prepped thoroughly with Puracyn. After introducing the needle into the desired treatment area, the syringe plunger was drawn back to ensure there was no flash of blood prior to injecting the filler in order to minimize risk of intravascular injection and vascular occlusion. A cannula was used to inject the filler. Insertion sites were prepped with hibiclens and puracyn lidocaine was injected to achieve good local anesthesia. A 23-gauge needle was used to create an opening at the insertion site and then the cannula was inserted using sterile technique. Prior to injecting filler at the desired location, the syringe plunger was drawn back to ensure there was no flash of blood in order to minimize risk of intravascular injection and vascular occlusion.  After injection of the filler, the treated areas were cleansed and iced to reduce swelling. Post-treatment instructions were reviewed with the patient.       Lot # [id number] Exp:  03/04/2021  Patient tolerated the procedure well. The patient will call with any problems, questions or concerns  prior to their next appointment.   Return as scheduled, for TBSE.  Graciella Belton, RMA, am acting as scribe for Forest Gleason, MD .  Documentation: I have reviewed the above documentation for accuracy and completeness, and I agree with the above.  Forest Gleason, MD

## 2020-04-02 ENCOUNTER — Encounter: Payer: Self-pay | Admitting: Dermatology

## 2020-04-18 ENCOUNTER — Encounter: Payer: Self-pay | Admitting: Dermatology

## 2020-04-25 ENCOUNTER — Other Ambulatory Visit: Payer: Self-pay

## 2020-04-25 ENCOUNTER — Ambulatory Visit (INDEPENDENT_AMBULATORY_CARE_PROVIDER_SITE_OTHER): Payer: Self-pay | Admitting: Dermatology

## 2020-04-25 DIAGNOSIS — L988 Other specified disorders of the skin and subcutaneous tissue: Secondary | ICD-10-CM

## 2020-04-25 NOTE — Progress Notes (Signed)
   Follow-Up Visit   Subjective  Holly Pineda is a 39 y.o. female who presents for the following: Facial Elastosis.  Patient here today for under eye filler and botox.  She has already had 1 unit of Botox to each lower eyelid.  The following portions of the chart were reviewed this encounter and updated as appropriate:  Tobacco  Allergies  Meds  Problems  Med Hx  Surg Hx  Fam Hx      Review of Systems:  No other skin or systemic complaints except as noted in HPI or Assessment and Plan.  Objective  Well appearing patient in no apparent distress; mood and affect are within normal limits.  A focused examination was performed including face. Relevant physical exam findings are noted in the Assessment and Plan.  Objective  face: Rhytides and volume loss.   Images                     Assessment & Plan  Elastosis of skin face  Volbella 0.33mL to undereyes Lot #[id number] EXP 08/03/2021  Botox Injection - face Location: See attached image  Informed consent: Discussed risks (infection, pain, bleeding, bruising, swelling, allergic reaction, paralysis of nearby muscles, eyelid droop, double vision, neck weakness, difficulty breathing, headache, undesirable cosmetic result, and need for additional treatment) and benefits of the procedure, as well as the alternatives.  Informed consent was obtained.  Preparation: The area was cleansed with alcohol.  Procedure Details:  Botox was injected into the dermis with a 30-gauge needle. Pressure applied to any bleeding. Ice packs offered for swelling.  Lot Number:  [id number] Expiration:  06/2022  Total Units Injected:  2  Plan: Patient was instructed to remain upright for 4 hours. Patient was instructed to avoid massaging the face and avoid vigorous exercise for the rest of the day. Tylenol may be used for headache.  Allow 2 weeks before returning to clinic for additional dosing as needed. Patient will call  for any problems.     Filling material injection - face Prior to the procedure, the patient's past medical history, allergies and the rare but potential risks and complications were reviewed with the patient and a signed consent was obtained. Pre and post-treatment care was discussed and instructions provided.  Location: [facility] Type: Olena Heckle  Procedure: The area was prepped thoroughly with Puracyn. A cannula was used to inject the filler. Insertion sites were prepped with puracyn and lidocaine was injected to achieve good local anesthesia. A 23-gauge needle was used to create an opening at the insertion site and then the cannula was inserted using sterile technique. Prior to injecting filler at the desired location, the syringe plunger was drawn back to ensure there was no flash of blood in order to minimize risk of intravascular injection and vascular occlusion.  After injection of the filler, the treated areas were cleansed and iced to reduce swelling. Post-treatment instructions were reviewed with the patient.       Patient tolerated the procedure well. The patient will call with any problems, questions or concerns prior to their next appointment.   Return for as scheduled .  Graciella Belton, RMA, am acting as scribe for Forest Gleason, MD .  Documentation: I have reviewed the above documentation for accuracy and completeness, and I agree with the above.  Forest Gleason, MD

## 2020-04-29 ENCOUNTER — Encounter: Payer: Self-pay | Admitting: Dermatology

## 2020-05-09 ENCOUNTER — Other Ambulatory Visit: Payer: Self-pay | Admitting: Medical

## 2020-05-13 ENCOUNTER — Other Ambulatory Visit: Payer: Self-pay | Admitting: Medical

## 2020-05-13 ENCOUNTER — Encounter: Payer: Self-pay | Admitting: Medical

## 2020-05-14 ENCOUNTER — Encounter: Payer: Self-pay | Admitting: Dermatology

## 2020-05-23 ENCOUNTER — Encounter: Payer: Self-pay | Admitting: Dermatology

## 2020-06-14 ENCOUNTER — Encounter: Payer: Self-pay | Admitting: Medical

## 2020-06-14 ENCOUNTER — Other Ambulatory Visit: Payer: Self-pay | Admitting: Medical

## 2020-06-14 DIAGNOSIS — E039 Hypothyroidism, unspecified: Secondary | ICD-10-CM

## 2020-06-14 NOTE — Progress Notes (Signed)
Orders placed to check thyroid. patient has refills on medication.

## 2020-06-19 ENCOUNTER — Other Ambulatory Visit: Payer: Self-pay | Admitting: Nurse Practitioner

## 2020-06-19 DIAGNOSIS — E039 Hypothyroidism, unspecified: Secondary | ICD-10-CM

## 2020-06-20 ENCOUNTER — Other Ambulatory Visit: Payer: Self-pay

## 2020-06-20 ENCOUNTER — Other Ambulatory Visit: Payer: BC Managed Care – PPO

## 2020-06-20 DIAGNOSIS — E039 Hypothyroidism, unspecified: Secondary | ICD-10-CM

## 2020-06-21 ENCOUNTER — Other Ambulatory Visit: Payer: Self-pay | Admitting: Medical

## 2020-06-21 ENCOUNTER — Encounter: Payer: Self-pay | Admitting: Medical

## 2020-06-21 DIAGNOSIS — E039 Hypothyroidism, unspecified: Secondary | ICD-10-CM

## 2020-06-21 LAB — CMP12+LP+TP+TSH+6AC+CBC/D/PLT
ALT: 10 IU/L (ref 0–32)
AST: 12 IU/L (ref 0–40)
Albumin/Globulin Ratio: 2.5 — ABNORMAL HIGH (ref 1.2–2.2)
Albumin: 4.7 g/dL (ref 3.8–4.8)
Alkaline Phosphatase: 45 IU/L (ref 44–121)
BUN/Creatinine Ratio: 15 (ref 9–23)
BUN: 11 mg/dL (ref 6–24)
Basophils Absolute: 0 10*3/uL (ref 0.0–0.2)
Basos: 1 %
Bilirubin Total: <0.2 mg/dL (ref 0.0–1.2)
Calcium: 9.3 mg/dL (ref 8.7–10.2)
Chloride: 106 mmol/L (ref 96–106)
Chol/HDL Ratio: 2.9 ratio (ref 0.0–4.4)
Cholesterol, Total: 183 mg/dL (ref 100–199)
Creatinine, Ser: 0.73 mg/dL (ref 0.57–1.00)
EOS (ABSOLUTE): 0.2 10*3/uL (ref 0.0–0.4)
Eos: 3 %
Estimated CHD Risk: <0.5 times avg. (ref 0.0–1.0)
Free Thyroxine Index: 1.8 (ref 1.2–4.9)
GFR calc Af Amer: 118 mL/min/{1.73_m2} (ref >59)
GFR calc non Af Amer: 103 mL/min/{1.73_m2} (ref >59)
GGT: 7 IU/L (ref 0–60)
Globulin, Total: 1.9 g/dL (ref 1.5–4.5)
Glucose: 79 mg/dL (ref 65–99)
HDL: 63 mg/dL (ref >39)
Hematocrit: 34.9 % (ref 34.0–46.6)
Hemoglobin: 12.2 g/dL (ref 11.1–15.9)
Immature Grans (Abs): 0 10*3/uL (ref 0.0–0.1)
Immature Granulocytes: 0 %
Iron: 120 ug/dL (ref 27–159)
LDH: 121 IU/L (ref 119–226)
LDL Chol Calc (NIH): 105 mg/dL — ABNORMAL HIGH (ref 0–99)
Lymphocytes Absolute: 1.7 10*3/uL (ref 0.7–3.1)
Lymphs: 35 %
MCH: 32 pg (ref 26.6–33.0)
MCHC: 35 g/dL (ref 31.5–35.7)
MCV: 92 fL (ref 79–97)
Monocytes Absolute: 0.5 10*3/uL (ref 0.1–0.9)
Monocytes: 9 %
Neutrophils Absolute: 2.6 10*3/uL (ref 1.4–7.0)
Neutrophils: 52 %
Phosphorus: 4.4 mg/dL — ABNORMAL HIGH (ref 3.0–4.3)
Platelets: 211 10*3/uL (ref 150–450)
Potassium: 4.5 mmol/L (ref 3.5–5.2)
RBC: 3.81 x10E6/uL (ref 3.77–5.28)
RDW: 11.5 % — ABNORMAL LOW (ref 11.7–15.4)
Sodium: 141 mmol/L (ref 134–144)
T3 Uptake Ratio: 27 % (ref 24–39)
T4, Total: 6.5 ug/dL (ref 4.5–12.0)
TSH: 4.52 u[IU]/mL — ABNORMAL HIGH (ref 0.450–4.500)
Total Protein: 6.6 g/dL (ref 6.0–8.5)
Triglycerides: 80 mg/dL (ref 0–149)
Uric Acid: 3.3 mg/dL (ref 2.6–6.2)
VLDL Cholesterol Cal: 15 mg/dL (ref 5–40)
WBC: 4.9 10*3/uL (ref 3.4–10.8)

## 2020-06-21 LAB — VITAMIN D 25 HYDROXY (VIT D DEFICIENCY, FRACTURES): Vit D, 25-Hydroxy: 41.2 ng/mL (ref 30.0–100.0)

## 2020-06-21 MED ORDER — LEVOTHYROXINE SODIUM 50 MCG PO TABS: 50.0000 ug | ORAL_TABLET | Freq: Every day | ORAL | 2 refills | Status: DC

## 2020-06-21 NOTE — Telephone Encounter (Signed)
Patient contacted via telephone and verified if she would prefer 30 or 90 day supply medications and she prefers dispense in 30 day increments.  Discussed labs will be due 8 weeks.  New Rx levothyroxine 46mcg po daily #30 RF2 sent to pharmacy for patient.  Patient verbalized understanding information/instructions, agreed with plan of care and had no further questions at this time.

## 2020-06-21 NOTE — Telephone Encounter (Signed)
Results for EVE, REY (MRN [id number]) as of 06/21/2020 12:33  Ref. Range 07/18/2019 07:47 08/16/2019 07:58 11/09/2019 07:46 02/28/2020 08:02 06/20/2020 09:10  TSH Latest Ref Range: 0.450 - 4.500 uIU/mL 6.140 (H) 5.420 (H) 7.470 (H) 7.330 (H) 4.520 (H)  Thyroxine (T4) Latest Ref Range: 4.5 - 12.0 ug/dL 5.7 6.0   6.5  Free Thyroxine Index Latest Ref Range: 1.2 - 4.9  1.5 1.6   1.8  T3 Uptake Ratio Latest Ref Range: 24 - 39 % 26 26   27    Patient reported on work days taking levothyroxine around 0900 and non work days sometimes as late as 1400 or missing a dose.  Has missed two doses in the past week but denied typically missing doses just administering at different times of day.  Denied changes in voice, trouble swallowing, palpitations, muscle cramps/weakness, hair loss.  Has noted a mild weight gain and dry skin.  "I used to have very oily skin/required blotting oil off throughout the day"  Still using the same skin care products.  Likes to eat broccolli.  Denied heavy intake of greens/broccolli though.  Denied recent viral illness  Still does not know what caused her to have thyroid problems.  Discussed could have been genetic predisposition and environmental exposure/stressors caused the thyroid dysfunction.  Discussed would send her my chart message of foods that inhibit levothyroxine absorption.  Discussed avoid tums/vitamins, dietary supplements or other medications when taking levothyroxine.  Important to take at approximately same time every day, not miss doses and 30-60 minutes prior to meal or 2 hours after.  TSH still above normal.  Discussed most healthy adults TSH level 2.  Will increase to 29mcg po daily on levothyroxine.  Patient reported only has 4 pills remaining of 1mcg.  She is going to change her administration time to 1100 daily so she can be more consistent on timing during weekends when she sleeps in.  Consider antibody testing or referral to endocrinology.  Patient  denied family history.  Discussed viral illness, thyroid nodule, and storming.  Patient still having regular menstrual cycles.   I would like to retest your level in 8 weeks as dose change today.  Please come back sooner if you are experiencing symptoms or low (hypothyroidism) Feeling as though you have no energy (lethargy); Not being able to tolerate cold, Weight gain that is not explained by a change in diet or exercise habits; Lack of appetite; Dry skin; Coarse hair; Menstrual irregularity; Slowing of thought processes; Constipation; Sadness or depression. Signs of hyperthyroidism (dose of medicine too high) are weight loss, palpitations (fast/irregular heart beat), trouble sleeping, trouble concentrating, hair loss, sweating, heat intolerance, skin color changes/rashes/itching, dry eyes/changes in vision, shortness of breath with exertion,  Diarrhea, increase or decrease in appetite, loss of menstruation, anxiety, irritability, and/or muscle weakness.  If you experience any of these symptoms please notify clinic staff ASAP. Start your 50 mcg synthroid once a day dosing 1 hour prior to meals or two hours after meals same time every day  Exitcare handouts given on hyperthyroidism and hypothryoidism sent to patient in my chart message. Patient verbalized understanding information/instructions, agreed with plan of care and had no further questions at this time.

## 2020-06-21 NOTE — Telephone Encounter (Signed)
Patient contacted clinic new Rx price $32/month she is wondering if taking 2 23mcg pills would be cheaper as she was only paying $10/month for 30 pills previously.  Notified patient I would contact pharmacy.  [facility] pharmacy staff contacted and they stated Rx same price for 25 or 48mcg but difference is 30 vs 90 days supply.

## 2020-07-05 ENCOUNTER — Other Ambulatory Visit: Payer: Self-pay | Admitting: Medical

## 2020-07-05 ENCOUNTER — Encounter: Payer: Self-pay | Admitting: Medical

## 2020-07-05 DIAGNOSIS — J301 Allergic rhinitis due to pollen: Secondary | ICD-10-CM

## 2020-07-05 NOTE — Telephone Encounter (Signed)
Patient requested refill azelastine 1 bottle with 3 refills electronic Rx signed and sent to her pharmacy of choice.  Patient contacted via telephone stated working well for her and no further questions or concerns at this time.  Last seen in clinic by NP Jerline [facility] [place].

## 2020-07-09 ENCOUNTER — Ambulatory Visit: Payer: BC Managed Care – PPO | Admitting: Medical

## 2020-07-15 ENCOUNTER — Encounter: Payer: Self-pay | Admitting: Medical

## 2020-07-16 ENCOUNTER — Ambulatory Visit: Payer: BC Managed Care – PPO | Admitting: Medical

## 2020-07-16 ENCOUNTER — Other Ambulatory Visit: Payer: Self-pay

## 2020-07-16 ENCOUNTER — Encounter: Payer: Self-pay | Admitting: Medical

## 2020-07-16 VITALS — BP 110/80 | HR 85 | Temp 97.9°F | Resp 14 | Ht 59.0 in | Wt 106.0 lb

## 2020-07-16 DIAGNOSIS — Z0000 Encounter for general adult medical examination without abnormal findings: Secondary | ICD-10-CM

## 2020-07-16 DIAGNOSIS — Z1159 Encounter for screening for other viral diseases: Secondary | ICD-10-CM

## 2020-07-16 NOTE — Patient Instructions (Signed)
Health Maintenance, Female Adopting a healthy lifestyle and getting preventive care are important in promoting health and wellness. Ask your health care provider about:  The right schedule for you to have regular tests and exams.  Things you can do on your own to prevent diseases and keep yourself healthy. What should I know about diet, weight, and exercise? Eat a healthy diet   Eat a diet that includes plenty of vegetables, fruits, low-fat dairy products, and lean protein.  Do not eat a lot of foods that are high in solid fats, added sugars, or sodium. Maintain a healthy weight Body mass index (BMI) is used to identify weight problems. It estimates body fat based on height and weight. Your health care provider can help determine your BMI and help you achieve or maintain a healthy weight. Get regular exercise Get regular exercise. This is one of the most important things you can do for your health. Most adults should:  Exercise for at least 150 minutes each week. The exercise should increase your heart rate and make you sweat (moderate-intensity exercise).  Do strengthening exercises at least twice a week. This is in addition to the moderate-intensity exercise.  Spend less time sitting. Even light physical activity can be beneficial. Watch cholesterol and blood lipids Have your blood tested for lipids and cholesterol at 41 years of age, then have this test every 5 years. Have your cholesterol levels checked more often if:  Your lipid or cholesterol levels are high.  You are older than 40 years of age.  You are at high risk for heart disease. What should I know about cancer screening? Depending on your health history and family history, you may need to have cancer screening at various ages. This may include screening for:  Breast cancer.  Cervical cancer.  Colorectal cancer.  Skin cancer.  Lung cancer. What should I know about heart disease, diabetes, and high blood  pressure? Blood pressure and heart disease  High blood pressure causes heart disease and increases the risk of stroke. This is more likely to develop in people who have high blood pressure readings, are of African descent, or are overweight.  Have your blood pressure checked: ? Every 3-5 years if you are 18-39 years of age. ? Every year if you are 40 years old or older. Diabetes Have regular diabetes screenings. This checks your fasting blood sugar level. Have the screening done:  Once every three years after age 40 if you are at a normal weight and have a low risk for diabetes.  More often and at a younger age if you are overweight or have a high risk for diabetes. What should I know about preventing infection? Hepatitis B If you have a higher risk for hepatitis B, you should be screened for this virus. Talk with your health care provider to find out if you are at risk for hepatitis B infection. Hepatitis C Testing is recommended for:  Everyone born from 1945 through 1965.  Anyone with known risk factors for hepatitis C. Sexually transmitted infections (STIs)  Get screened for STIs, including gonorrhea and chlamydia, if: ? You are sexually active and are younger than 41 years of age. ? You are older than 41 years of age and your health care provider tells you that you are at risk for this type of infection. ? Your sexual activity has changed since you were last screened, and you are at increased risk for chlamydia or gonorrhea. Ask your health care provider if   you are at risk.  Ask your health care provider about whether you are at high risk for HIV. Your health care provider may recommend a prescription medicine to help prevent HIV infection. If you choose to take medicine to prevent HIV, you should first get tested for HIV. You should then be tested every 3 months for as long as you are taking the medicine. Pregnancy  If you are about to stop having your period (premenopausal) and  you may become pregnant, seek counseling before you get pregnant.  Take 400 to 800 micrograms (mcg) of folic acid every day if you become pregnant.  Ask for birth control (contraception) if you want to prevent pregnancy. Osteoporosis and menopause Osteoporosis is a disease in which the bones lose minerals and strength with aging. This can result in bone fractures. If you are 65 years old or older, or if you are at risk for osteoporosis and fractures, ask your health care provider if you should:  Be screened for bone loss.  Take a calcium or vitamin D supplement to lower your risk of fractures.  Be given hormone replacement therapy (HRT) to treat symptoms of menopause. Follow these instructions at home: Lifestyle  Do not use any products that contain nicotine or tobacco, such as cigarettes, e-cigarettes, and chewing tobacco. If you need help quitting, ask your health care provider.  Do not use street drugs.  Do not share needles.  Ask your health care provider for help if you need support or information about quitting drugs. Alcohol use  Do not drink alcohol if: ? Your health care provider tells you not to drink. ? You are pregnant, may be pregnant, or are planning to become pregnant.  If you drink alcohol: ? Limit how much you use to 0-1 drink a day. ? Limit intake if you are breastfeeding.  Be aware of how much alcohol is in your drink. In the [place] one drink equals one 12 oz bottle of beer (355 mL), one 5 oz glass of wine (148 mL), or one 1 oz glass of hard liquor (44 mL). General instructions  Schedule regular health, dental, and eye exams.  Stay current with your vaccines.  Tell your health care provider if: ? You often feel depressed. ? You have ever been abused or do not feel safe at home. Summary  Adopting a healthy lifestyle and getting preventive care are important in promoting health and wellness.  Follow your health care provider's instructions about healthy  diet, exercising, and getting tested or screened for diseases.  Follow your health care provider's instructions on monitoring your cholesterol and blood pressure. This information is not intended to replace advice given to you by your health care provider. Make sure you discuss any questions you have with your health care provider. Document Revised: 07/13/2018 Document Reviewed: 07/13/2018 Elsevier Patient Education  2020 Elsevier Inc.  

## 2020-07-16 NOTE — Progress Notes (Signed)
Subjective:    Patient ID: Holly Pineda, female    DOB: [birth year], 39 y.o.   MRN: [id number]  HPI 39 yo female in non acute distress, here for annual exam and review of labs.  Skin improving as far as dryness. Tiredness normal per patient.  OB/GYN papsmear with Dr. Leafy Ro next Thursday for annual. Breast exam as well. ;Blood pressure 110/80, pulse 85, temperature 97.9 F (36.6 C), resp. rate 14, height 4' 11"  (1.499 m), weight 106 lb (48.1 kg), last menstrual period 07/03/2020, SpO2 100 %.    No Known Allergies    Current Outpatient Medications:    Azelastine HCl 137 MCG/SPRAY SOLN, PLACE 2 SPRAYS INTO BOTH NOSTRILS 2 (TWO) TIMES DAILY. USE IN EACH NOSTRIL AS DIRECTED, Disp: 30 mL, Rfl: 3   levonorgestrel-ethinyl estradiol (ALESSE) 0.1-20 MG-MCG tablet, Take 1 tablet by mouth daily. (Patient not taking: Reported on 07/16/2020), Disp: , Rfl:    levothyroxine (SYNTHROID) 50 MCG tablet, Take 1 tablet (50 mcg total) by mouth daily., Disp: 30 tablet, Rfl: 2   Multiple Vitamin (MULTIVITAMIN) capsule, Take 1 capsule by mouth daily., Disp: , Rfl:    QUEtiapine (SEROQUEL) 50 MG tablet, Take 100 mg by mouth at bedtime., Disp: , Rfl:    tretinoin (RETIN-A) 0.05 % cream, , Disp: , Rfl:     Review of Systems  Allergic/Immunologic: Positive for environmental allergies.  Psychiatric/Behavioral: Negative for self-injury and suicidal ideas. The patient is not nervous/anxious.        Depression negative.  both  Mother and Father with hypertension. Mother had MI  11/21 , was not taking her blood pressure. Medication. She takes care of her  3 yo Mother.  Boy at age 14 and a girl who is  4 and married.    Right  Bunion noted on foot but no pain now. Objective:   Physical Exam Vitals and nursing note reviewed.  Constitutional:      Appearance: Normal appearance. She is normal weight.  HENT:     Head: Normocephalic and atraumatic.     Jaw: There is normal jaw occlusion.      Right Ear: Hearing, ear canal and external ear normal. A middle ear effusion (right> left) is present.     Left Ear: Hearing, ear canal and external ear normal. A middle ear effusion is present.     Nose: Nose normal.     Mouth/Throat:     Lips: Pink.     Mouth: Mucous membranes are moist.     Pharynx: Oropharynx is clear.  Eyes:     General: Lids are normal. Vision grossly intact. Gaze aligned appropriately.     Extraocular Movements: Extraocular movements intact.     Conjunctiva/sclera: Conjunctivae normal.     Pupils: Pupils are equal, round, and reactive to light.  Neck:     Thyroid: No thyroid mass, thyromegaly or thyroid tenderness.  Cardiovascular:     Rate and Rhythm: Normal rate and regular rhythm.     Pulses: Normal pulses.          Radial pulses are 2+ on the right side and 2+ on the left side.       Dorsalis pedis pulses are 2+ on the right side and 2+ on the left side.       Posterior tibial pulses are 2+ on the right side and 2+ on the left side.     Heart sounds: Normal heart sounds. Heart sounds not distant. No murmur heard. No friction  rub. No gallop. No S3 or S4 sounds.   Pulmonary:     Effort: Pulmonary effort is normal.     Breath sounds: Normal breath sounds and air entry.  Chest:  Breasts:     Right: No supraclavicular adenopathy.     Left: No supraclavicular adenopathy.    Abdominal:     General: Abdomen is flat. Bowel sounds are normal.     Palpations: Abdomen is soft. There is no hepatomegaly or splenomegaly.     Tenderness: There is no abdominal tenderness. There is no right CVA tenderness or left CVA tenderness.  Musculoskeletal:        General: Normal range of motion.     Cervical back: Normal range of motion and neck supple. No tenderness.     Right lower leg: No edema.     Left lower leg: No edema.     Right foot: Bunion present.  Feet:     Right foot:     Skin integrity: Skin integrity normal.     Toenail Condition: Right toenails are normal.      Left foot:     Skin integrity: Skin integrity normal.     Toenail Condition: Left toenails are normal.  Lymphadenopathy:     Head:     Right side of head: No submental, submandibular, tonsillar, preauricular, posterior auricular or occipital adenopathy.     Left side of head: No submental, submandibular, tonsillar, preauricular, posterior auricular or occipital adenopathy.     Cervical: No cervical adenopathy.     Right cervical: No superficial, deep or posterior cervical adenopathy.    Left cervical: No superficial, deep or posterior cervical adenopathy.     Upper Body:     Right upper body: No supraclavicular or epitrochlear adenopathy.     Left upper body: No supraclavicular or epitrochlear adenopathy.  Skin:    General: Skin is warm.     Capillary Refill: Capillary refill takes less than 2 seconds.  Neurological:     General: No focal deficit present.     Mental Status: She is alert and oriented to person, place, and time. Mental status is at baseline.     GCS: GCS eye subscore is 4. GCS verbal subscore is 5. GCS motor subscore is 6.     Cranial Nerves: Cranial nerves are intact.     Sensory: Sensation is intact.     Motor: Motor function is intact.     Coordination: Coordination is intact.     Gait: Gait is intact.     Deep Tendon Reflexes:     Reflex Scores:      Brachioradialis reflexes are 2+ on the right side and 2+ on the left side.      Patellar reflexes are 2+ on the right side and 2+ on the left side. Psychiatric:        Attention and Perception: Attention and perception normal.        Mood and Affect: Mood and affect normal.        Speech: Speech normal.        Behavior: Behavior normal. Behavior is cooperative.        Thought Content: Thought content normal.        Cognition and Memory: Cognition and memory normal.        Judgment: Judgment normal.       Recent Results (from the past 2160 hour(s))  CMP12+LP+TP+TSH+6AC+CBC/D/Plt     Status: Abnormal    Collection Time: 06/20/20  9:10 AM  Result Value Ref Range   Glucose 79 65 - 99 mg/dL   Uric Acid 3.3 2.6 - 6.2 mg/dL    Comment:            Therapeutic target for gout patients: <6.0   BUN 11 6 - 24 mg/dL   Creatinine, Ser 0.73 0.57 - 1.00 mg/dL   GFR calc non Af Amer 103 >59 mL/min/1.73   GFR calc Af Amer 118 >59 mL/min/1.73    Comment: **In accordance with recommendations from the NKF-ASN Task force,**   [facility] is in the process of updating its eGFR calculation to the   2021 CKD-EPI creatinine equation that estimates kidney function   without a race variable.    BUN/Creatinine Ratio 15 9 - 23   Sodium 141 134 - 144 mmol/L   Potassium 4.5 3.5 - 5.2 mmol/L   Chloride 106 96 - 106 mmol/L   Calcium 9.3 8.7 - 10.2 mg/dL   Phosphorus 4.4 (H) 3.0 - 4.3 mg/dL   Total Protein 6.6 6.0 - 8.5 g/dL   Albumin 4.7 3.8 - 4.8 g/dL   Globulin, Total 1.9 1.5 - 4.5 g/dL   Albumin/Globulin Ratio 2.5 (H) 1.2 - 2.2   Bilirubin Total <0.2 0.0 - 1.2 mg/dL   Alkaline Phosphatase 45 44 - 121 IU/L    Comment:               **Please note reference interval change**   LDH 121 119 - 226 IU/L   AST 12 0 - 40 IU/L   ALT 10 0 - 32 IU/L   GGT 7 0 - 60 IU/L   Iron 120 27 - 159 ug/dL   Cholesterol, Total 183 100 - 199 mg/dL   Triglycerides 80 0 - 149 mg/dL   HDL 63 >39 mg/dL   VLDL Cholesterol Cal 15 5 - 40 mg/dL   LDL Chol Calc (NIH) 105 (H) 0 - 99 mg/dL   Chol/HDL Ratio 2.9 0.0 - 4.4 ratio    Comment:                                   T. Chol/HDL Ratio                                             Men  Women                               1/2 Avg.Risk  3.4    3.3                                   Avg.Risk  5.0    4.4                                2X Avg.Risk  9.6    7.1                                3X Avg.Risk 23.4   11.0    Estimated CHD Risk  < 0.5 0.0 - 1.0 times  avg.    Comment: The CHD Risk is based on the T. Chol/HDL ratio. Other factors affect CHD Risk such as hypertension, smoking, diabetes,  severe obesity, and family history of premature CHD.    TSH 4.520 (H) 0.450 - 4.500 uIU/mL   T4, Total 6.5 4.5 - 12.0 ug/dL   T3 Uptake Ratio 27 24 - 39 %   Free Thyroxine Index 1.8 1.2 - 4.9   WBC 4.9 3.4 - 10.8 x10E3/uL   RBC 3.81 3.77 - 5.28 x10E6/uL   Hemoglobin 12.2 11.1 - 15.9 g/dL   Hematocrit 34.9 34.0 - 46.6 %   MCV 92 79 - 97 fL   MCH 32.0 26.6 - 33.0 pg   MCHC 35.0 31.5 - 35.7 g/dL   RDW 11.5 (L) 11.7 - 15.4 %   Platelets 211 150 - 450 x10E3/uL   Neutrophils 52 Not Estab. %   Lymphs 35 Not Estab. %   Monocytes 9 Not Estab. %   Eos 3 Not Estab. %   Basos 1 Not Estab. %   Neutrophils Absolute 2.6 1.4 - 7.0 x10E3/uL   Lymphocytes Absolute 1.7 0.7 - 3.1 x10E3/uL   Monocytes Absolute 0.5 0.1 - 0.9 x10E3/uL   EOS (ABSOLUTE) 0.2 0.0 - 0.4 x10E3/uL   Basophils Absolute 0.0 0.0 - 0.2 x10E3/uL   Immature Granulocytes 0 Not Estab. %   Immature Grans (Abs) 0.0 0.0 - 0.1 x10E3/uL  VITAMIN D 25 Hydroxy (Vit-D Deficiency, Fractures)     Status: None   Collection Time: 06/20/20  9:10 AM  Result Value Ref Range   Vit D, 25-Hydroxy 41.2 30.0 - 100.0 ng/mL    Comment: Vitamin D deficiency has been defined by the Institute of Medicine and an Endocrine Society practice guideline as a level of serum 25-OH vitamin D less than 20 ng/mL (1,2). The Endocrine Society went on to further define vitamin D insufficiency as a level between 21 and 29 ng/mL (2). 1. IOM (Institute of Medicine). 2010. Dietary reference    intakes for calcium and D. [place]: The    [employer]. 2. Holick MF, Binkley [place], Bischoff-Ferrari HA, et al.    Evaluation, treatment, and prevention of vitamin D    deficiency: an Endocrine Society clinical practice    guideline. JCEM. 2011 Jul; 96(7):1911-30.       Assessment & Plan:  Hypothyroidism continue current dosage. Recheck in  3 months. Patient just urinated so cannot check urine today. For ETD restart antihistimine like OTC Zyrtec or  Claritin. Declines Flu vaccine at this time. Has  Appointment with OB/GYN next week. Hep C screening ordered. EKG compleeted  in 2019.  Return to clinic if any concerns or MyChart message me if needed. Patient verbalizes understanding and has no questions at discharge.

## 2020-08-08 DIAGNOSIS — Z3009 Encounter for other general counseling and advice on contraception: Secondary | ICD-10-CM | POA: Diagnosis not present

## 2020-08-08 DIAGNOSIS — N920 Excessive and frequent menstruation with regular cycle: Secondary | ICD-10-CM | POA: Diagnosis not present

## 2020-08-08 DIAGNOSIS — Z01419 Encounter for gynecological examination (general) (routine) without abnormal findings: Secondary | ICD-10-CM | POA: Diagnosis not present

## 2020-08-08 DIAGNOSIS — Z113 Encounter for screening for infections with a predominantly sexual mode of transmission: Secondary | ICD-10-CM | POA: Diagnosis not present

## 2020-08-08 DIAGNOSIS — Z1231 Encounter for screening mammogram for malignant neoplasm of breast: Secondary | ICD-10-CM | POA: Diagnosis not present

## 2020-09-09 ENCOUNTER — Other Ambulatory Visit: Payer: Self-pay | Admitting: Obstetrics and Gynecology

## 2020-09-09 DIAGNOSIS — Z1231 Encounter for screening mammogram for malignant neoplasm of breast: Secondary | ICD-10-CM

## 2020-09-13 DIAGNOSIS — Z23 Encounter for immunization: Secondary | ICD-10-CM | POA: Diagnosis not present

## 2020-09-18 ENCOUNTER — Encounter: Payer: Self-pay | Admitting: Medical

## 2020-09-19 ENCOUNTER — Other Ambulatory Visit: Payer: BC Managed Care – PPO

## 2020-09-19 ENCOUNTER — Other Ambulatory Visit: Payer: Self-pay

## 2020-09-19 DIAGNOSIS — E039 Hypothyroidism, unspecified: Secondary | ICD-10-CM

## 2020-09-19 DIAGNOSIS — Z1159 Encounter for screening for other viral diseases: Secondary | ICD-10-CM

## 2020-09-19 DIAGNOSIS — Z0000 Encounter for general adult medical examination without abnormal findings: Secondary | ICD-10-CM

## 2020-09-19 LAB — POCT URINALYSIS DIPSTICK
Bilirubin, UA: NEGATIVE
Blood, UA: NEGATIVE
Glucose, UA: NEGATIVE
Ketones, UA: NEGATIVE
Leukocytes, UA: NEGATIVE
Nitrite, UA: NEGATIVE
Protein, UA: NEGATIVE
Spec Grav, UA: 1.01 (ref 1.010–1.025)
Urobilinogen, UA: 0.2 E.U./dL
pH, UA: 5 (ref 5.0–8.0)

## 2020-09-19 NOTE — Progress Notes (Signed)
Lab work

## 2020-09-20 ENCOUNTER — Telehealth: Payer: Self-pay | Admitting: Nurse Practitioner

## 2020-09-20 LAB — THYROID PANEL WITH TSH
Free Thyroxine Index: 2 (ref 1.2–4.9)
T3 Uptake Ratio: 28 % (ref 24–39)
T4, Total: 7 ug/dL (ref 4.5–12.0)
TSH: 2.76 u[IU]/mL (ref 0.450–4.500)

## 2020-09-20 LAB — HEPATITIS C ANTIBODY: Hep C Virus Ab: <0.1 s/co ratio (ref 0.0–0.9)

## 2020-09-20 NOTE — Telephone Encounter (Signed)
Spoke with patient to update on Thyroid results from yesterday.   Advised to continue medications as prescribed and Daryll Drown will follow up with final lab results next week.   Recent Results (from the past 2160 hour(s))  Thyroid Panel With TSH     Status: None   Collection Time: 09/19/20  8:15 AM  Result Value Ref Range   TSH 2.760 0.450 - 4.500 uIU/mL   T4, Total 7.0 4.5 - 12.0 ug/dL   T3 Uptake Ratio 28 24 - 39 %   Free Thyroxine Index 2.0 1.2 - 4.9  POCT urinalysis dipstick     Status: Normal   Collection Time: 09/19/20  9:06 AM  Result Value Ref Range   Color, UA amber    Clarity, UA clear    Glucose, UA Negative Negative   Bilirubin, UA Negative    Ketones, UA Negative    Spec Grav, UA 1.010 1.010 - 1.025   Blood, UA Negative    pH, UA 5.0 5.0 - 8.0   Protein, UA Negative Negative   Urobilinogen, UA 0.2 0.2 or 1.0 E.U./dL   Nitrite, UA Negative    Leukocytes, UA Negative Negative   Appearance     Odor

## 2020-09-23 ENCOUNTER — Other Ambulatory Visit: Payer: Self-pay

## 2020-09-23 ENCOUNTER — Telehealth: Payer: BC Managed Care – PPO | Admitting: Medical

## 2020-09-23 NOTE — Progress Notes (Signed)
Patient ID: Holly Pineda, female   DOB: Aug 11, 1980, 40 y.o.   MRN: [id number]   Patient questions why [name] NP reviewed labs with patient. The labs went to [place].  I explained to patient if she had seen patient that her labs can go to that provider.  She states she is not taking the OCP, they were prescribed for painful periods, but since it is not every month, she has currently chose not to take them. Prescribed by her OB/GYN.  She does mention the skin on her face is very dry and she is using OTC recommended for her son's dry skin on his face. She is not getting any relief. She has an appointment with her Dermatologist on  October 09, 2020. I recommend she discuss the dryness with her. Holly Pineda says she will do so.  All questions answered for patient. Currently Thyroid TSH is wnl. Follow up as needed. Patient verbalizes understanding and has no more questions at the end of our conversation.

## 2020-09-23 NOTE — Progress Notes (Signed)
Thank you Tina! 

## 2020-09-26 ENCOUNTER — Encounter: Payer: Self-pay | Admitting: Dermatology

## 2020-10-01 ENCOUNTER — Ambulatory Visit
Admission: RE | Admit: 2020-10-01 | Discharge: 2020-10-01 | Disposition: A | Discharge status: Home / Self Care | Payer: BC Managed Care – PPO | Source: Ambulatory Visit | Attending: Obstetrics and Gynecology | Admitting: Obstetrics and Gynecology

## 2020-10-01 ENCOUNTER — Other Ambulatory Visit: Payer: Self-pay

## 2020-10-01 DIAGNOSIS — Z1231 Encounter for screening mammogram for malignant neoplasm of breast: Secondary | ICD-10-CM | POA: Insufficient documentation

## 2020-10-17 ENCOUNTER — Other Ambulatory Visit: Payer: Self-pay

## 2020-10-17 ENCOUNTER — Ambulatory Visit: Payer: BC Managed Care – PPO | Admitting: Dermatology

## 2020-10-17 DIAGNOSIS — Z1283 Encounter for screening for malignant neoplasm of skin: Secondary | ICD-10-CM | POA: Diagnosis not present

## 2020-10-17 DIAGNOSIS — L578 Other skin changes due to chronic exposure to nonionizing radiation: Secondary | ICD-10-CM | POA: Diagnosis not present

## 2020-10-17 DIAGNOSIS — D225 Melanocytic nevi of trunk: Secondary | ICD-10-CM

## 2020-10-17 DIAGNOSIS — L814 Other melanin hyperpigmentation: Secondary | ICD-10-CM

## 2020-10-17 DIAGNOSIS — Z86018 Personal history of other benign neoplasm: Secondary | ICD-10-CM | POA: Diagnosis not present

## 2020-10-17 DIAGNOSIS — L905 Scar conditions and fibrosis of skin: Secondary | ICD-10-CM | POA: Diagnosis not present

## 2020-10-17 DIAGNOSIS — L821 Other seborrheic keratosis: Secondary | ICD-10-CM

## 2020-10-17 DIAGNOSIS — L700 Acne vulgaris: Secondary | ICD-10-CM

## 2020-10-17 DIAGNOSIS — D485 Neoplasm of uncertain behavior of skin: Secondary | ICD-10-CM

## 2020-10-17 DIAGNOSIS — D1800 Hemangioma unspecified site: Secondary | ICD-10-CM

## 2020-10-17 DIAGNOSIS — D229 Melanocytic nevi, unspecified: Secondary | ICD-10-CM

## 2020-10-17 MED ORDER — CLINDAMYCIN PHOS-BENZOYL PEROX 1-5 % EX GEL: CUTANEOUS | 2 refills | Status: DC

## 2020-10-17 NOTE — Progress Notes (Signed)
Follow-Up Visit   Subjective  Holly Pineda is a 40 y.o. female who presents for the following: FBSE (Patient here for full body skin exam and skin cancer screening. Patient with hx of dysplastic nevi. She is not aware of any new or changing spots but there are a few at braline that get irritated. Patient also complains of being very dry at the face. She does use tretinoin but has been using it for a long time and has been using argan oil, squalene. ).   The following portions of the chart were reviewed this encounter and updated as appropriate:   Tobacco  Allergies  Meds  Problems  Med Hx  Surg Hx  Fam Hx      Review of Systems:  No other skin or systemic complaints except as noted in HPI or Assessment and Plan.  Objective  Well appearing patient in no apparent distress; mood and affect are within normal limits.  A full examination was performed including scalp, head, eyes, ears, nose, lips, neck, chest, axillae, abdomen, back, buttocks, bilateral upper extremities, bilateral lower extremities, hands, feet, fingers, toes, fingernails, and toenails. All findings within normal limits unless otherwise noted below.  Objective  face: Rare inflammatory papule lower face, rare open comedone  Objective  Right mid Back: 0.6cm erythematous flesh papule     Objective  mid back left of midline: 0.5cm erythematous flesh papule      Assessment & Plan  Acne vulgaris face  Continue tretinoin 0.05% QOHS - QHS Continue BenzaClin AM PRN to spot treat.  Chronic condition with duration or expected duration over one year. Currently well-controlled.  Medication side effect -significant xerosis, decrease tretinoin to every other night, reviewed gentle skin care.   Topical retinoid medications like tretinoin/Retin-A, adapalene/Differin, tazarotene/Fabior, and Epiduo/Epiduo Forte can cause dryness and irritation when first started. Only apply a pea-sized amount to the entire  affected area. Avoid applying it around the eyes, edges of mouth and creases at the nose. If you experience irritation, use a good moisturizer first and/or apply the medicine less often. If you are doing well with the medicine, you can increase how often you use it until you are applying every night. Be careful with sun protection while using this medication as it can make you sensitive to the sun. This medicine should not be used by pregnant women.    Ordered Medications: clindamycin-benzoyl peroxide (BENZACLIN) gel  Neoplasm of uncertain behavior of skin (2) Right mid Back  Epidermal / dermal shaving  Lesion diameter (cm):  0.6 Informed consent: discussed and consent obtained   Timeout: patient name, date of birth, surgical site, and procedure verified   Patient was prepped and draped in usual sterile fashion: area prepped with isopropyl alcohol. Anesthesia: the lesion was anesthetized in a standard fashion   Anesthetic:  1% lidocaine w/ epinephrine 1-100,000 buffered w/ 8.4% NaHCO3 Instrument used: DermaBlade   Hemostasis achieved with: aluminum chloride   Outcome: patient tolerated procedure well   Post-procedure details: wound care instructions given   Additional details:  Mupirocin and a bandage applied  Specimen 1 - Surgical pathology Differential Diagnosis: r/o Irritated Nevus  Check Margins: No 0.6cm erythematous flesh papule  mid back left of midline  Epidermal / dermal shaving  Lesion diameter (cm):  0.5 Informed consent: discussed and consent obtained   Timeout: patient name, date of birth, surgical site, and procedure verified   Patient was prepped and draped in usual sterile fashion: area prepped with isopropyl alcohol.  Anesthesia: the lesion was anesthetized in a standard fashion   Anesthetic:  1% lidocaine w/ epinephrine 1-100,000 buffered w/ 8.4% NaHCO3 Instrument used: DermaBlade   Hemostasis achieved with: aluminum chloride   Outcome: patient tolerated  procedure well   Post-procedure details: wound care instructions given   Additional details:  Mupirocin and a bandage applied  Specimen 2 - Surgical pathology Differential Diagnosis: r/o Irritated Nevus  Check Margins: No 0.5cm erythematous flesh papule   Lentigines - Scattered tan macules - Due to sun exposure - Benign-appering, observe - Recommend daily broad spectrum sunscreen SPF 30+ to sun-exposed areas, reapply every 2 hours as needed. - Call for any changes  Seborrheic Keratoses - Stuck-on, waxy, tan-brown papules and plaques  - Discussed benign etiology and prognosis. - Observe - Call for any changes  Melanocytic Nevi - Tan-brown and/or pink-flesh-colored symmetric macules and papules - Benign appearing on exam today - Observation - Call clinic for new or changing moles - Recommend daily use of broad spectrum spf 30+ sunscreen to sun-exposed areas.   Hemangiomas - Red papules - Discussed benign nature - Observe - Call for any changes  Actinic Damage - Chronic, secondary to cumulative UV/sun exposure - diffuse scaly erythematous macules with underlying dyspigmentation - Recommend daily broad spectrum sunscreen SPF 30+ to sun-exposed areas, reapply every 2 hours as needed.  - Call for new or changing lesions.  Skin cancer screening performed today.  History of Dysplastic Nevi - No evidence of recurrence today - Recommend regular full body skin exams - Recommend daily broad spectrum sunscreen SPF 30+ to sun-exposed areas, reapply every 2 hours as needed.  - Call if any new or changing lesions are noted between office visits  Return in about 1 year (around 10/17/2021) for TBSE, 4 weeks for shave removal .  Graciella Belton, RMA, am acting as scribe for Forest Gleason, MD .  Documentation: I have reviewed the above documentation for accuracy and completeness, and I agree with the above.  Forest Gleason, MD

## 2020-10-17 NOTE — Patient Instructions (Addendum)
If you have any questions or concerns for your doctor, please call our main line at [phone] and press option 4 to reach your doctor's medical assistant. If no one answers, please leave a voicemail as directed and we will return your call as soon as possible. Messages left after 4 pm will be answered the following business day.   You may also send [place] a message via [place]. We typically respond to MyChart messages within 1-2 business days.  For prescription refills, please ask your pharmacy to contact our office. Our fax number is [phone].  If you have an urgent issue when the clinic is closed that cannot wait until the next business day, you can page your doctor at the number below.    Please note that while we do our best to be available for urgent issues outside of office hours, we are not available 24/7.   If you have an urgent issue and are unable to reach [place], you may choose to seek medical care at your doctor's office, retail clinic, urgent care center, or emergency room.  If you have a medical emergency, please immediately call 911 or go to the emergency department.  Pager Numbers  - Dr. Nehemiah Massed: [phone]  - Dr. Laurence Ferrari: [phone]  - Dr. Nicole Kindred: [phone]  In the event of inclement weather, please call our main line at [phone] for an update on the status of any delays or closures.  Dermatology Medication Tips: Please keep the boxes that topical medications come in in order to help keep track of the instructions about where and how to use these. Pharmacies typically print the medication instructions only on the boxes and not directly on the medication tubes.   If your medication is too expensive, please contact our office at [phone] option 4 or send [place] a message through [place].   We are unable to tell what your co-pay for medications will be in advance as this is different depending on your insurance coverage. However, we may be able to find a substitute  medication at lower cost or fill out paperwork to get insurance to cover a needed medication.   If a prior authorization is required to get your medication covered by your insurance company, please allow [place] 1-2 business days to complete this process.  Drug prices often vary depending on where the prescription is filled and some pharmacies may offer cheaper prices.  The website www.goodrx.com contains coupons for medications through different pharmacies. The prices here do not account for what the cost may be with help from insurance (it may be cheaper with your insurance), but the website can give you the price if you did not use any insurance.  - You can print the associated coupon and take it with your prescription to the pharmacy.  - You may also stop by our office during regular business hours and pick up a GoodRx coupon card.  - If you need your prescription sent electronically to a different pharmacy, notify our office through [facility] or by phone at [phone] option 4.   Melanoma ABCDEs  Melanoma is the most dangerous type of skin cancer, and is the leading cause of death from skin disease.  You are more likely to develop melanoma if you:  Have light-colored skin, light-colored eyes, or red or blond hair  Spend a lot of time in the sun  Tan regularly, either outdoors or in a tanning bed  Have had blistering sunburns, especially during childhood  Have a close  family member who has had a melanoma  Have atypical moles or large birthmarks  Early detection of melanoma is key since treatment is typically straightforward and cure rates are extremely high if we catch it early.   The first sign of melanoma is often a change in a mole or a new dark spot.  The ABCDE system is a way of remembering the signs of melanoma.  A for asymmetry:  The two halves do not match. B for border:  The edges of the growth are irregular. C for color:  A mixture of colors are present instead  of an even brown color. D for diameter:  Melanomas are usually (but not always) greater than 28mm - the size of a pencil eraser. E for evolution:  The spot keeps changing in size, shape, and color.  Please check your skin once per month between visits. You can use a small mirror in front and a large mirror behind you to keep an eye on the back side or your body.   If you see any new or changing lesions before your next follow-up, please call to schedule a visit.  Please continue daily skin protection including broad spectrum sunscreen SPF 30+ to sun-exposed areas, reapplying every 2 hours as needed when you're outdoors.    Recommend CeraVe or Vanicream cleanser for face.  Recommend a cream moisturizer (vanicream, cerave, cetaphil or la roche posay are good options) after washing face then before bed can apply tretinoin, then argan or squalane oil. Use tretinoin every other night until dryness is better controlled.   Topical retinoid medications like tretinoin/Retin-A, adapalene/Differin, tazarotene/Fabior, and Epiduo/Epiduo Forte can cause dryness and irritation when first started. Only apply a pea-sized amount to the entire affected area. Avoid applying it around the eyes, edges of mouth and creases at the nose. If you experience irritation, use a good moisturizer first and/or apply the medicine less often. If you are doing well with the medicine, you can increase how often you use it until you are applying every night. Be careful with sun protection while using this medication as it can make you sensitive to the sun. This medicine should not be used by pregnant women.    Wound Care Instructions  1. Cleanse wound gently with soap and water once a day then pat dry with clean gauze. Apply a thing coat of Petrolatum (petroleum jelly, "Vaseline") over the wound (unless you have an allergy to this). We recommend that you use a new, sterile tube of Vaseline. Do not pick or remove scabs. Do not remove  the yellow or white "healing tissue" from the base of the wound.  2. Cover the wound with fresh, clean, nonstick gauze and secure with paper tape. You may use Band-Aids in place of gauze and tape if the would is small enough, but would recommend trimming much of the tape off as there is often too much. Sometimes Band-Aids can irritate the skin.  3. You should call the office for your biopsy report after 1 week if you have not already been contacted.  4. If you experience any problems, such as abnormal amounts of bleeding, swelling, significant bruising, significant pain, or evidence of infection, please call the office immediately.  5. FOR ADULT SURGERY PATIENTS: If you need something for pain relief you may take 1 extra strength Tylenol (acetaminophen) AND 2 Ibuprofen (200mg  each) together every 4 hours as needed for pain. (do not take these if you are allergic to them or if you  have a reason you should not take them.) Typically, you may only need pain medication for 1 to 3 days.

## 2020-10-28 ENCOUNTER — Encounter: Payer: Self-pay | Admitting: Dermatology

## 2020-11-01 ENCOUNTER — Other Ambulatory Visit: Payer: Self-pay

## 2020-11-01 ENCOUNTER — Other Ambulatory Visit: Payer: Self-pay | Admitting: Nurse Practitioner

## 2020-11-01 ENCOUNTER — Telehealth: Payer: BC Managed Care – PPO | Admitting: Nurse Practitioner

## 2020-11-01 DIAGNOSIS — Z20828 Contact with and (suspected) exposure to other viral communicable diseases: Secondary | ICD-10-CM

## 2020-11-01 MED ORDER — OSELTAMIVIR PHOSPHATE 75 MG PO CAPS: 75.0000 mg | ORAL_CAPSULE | Freq: Every day | ORAL | 0 refills | Status: AC

## 2020-11-01 NOTE — Progress Notes (Signed)
Patient called noting her son tested positive for FLU A today. She has been taking care of him, though patient is not symptomatic herself.  She is interested in prophylactic Tamiflu. She has not taken tamiflu in the past, denies any known allergies.    Discussed how to take tamiflu, take with food. Stick with once daily for 10 days unless symptoms onset then increase dosage to twice daily as discussed.

## 2020-11-01 NOTE — Progress Notes (Signed)
   Her son tested positive for Flu A today  Patient without symptoms currently  Would like Tamiflu prophylaxis   Meds ordered this encounter  Medications   oseltamivir (TAMIFLU) 75 MG capsule    Sig: Take 1 capsule (75 mg total) by mouth daily for 10 days. If flu like symptoms onset, increase to one capsule twice daily until finished. Take with food.    Dispense:  10 capsule    Refill:  0     Follow up with clinic as needed with any symptom onset

## 2020-11-10 ENCOUNTER — Other Ambulatory Visit: Payer: Self-pay | Admitting: Registered Nurse

## 2020-11-10 NOTE — Telephone Encounter (Signed)
[place] patient came to replacements in box

## 2020-11-11 NOTE — Telephone Encounter (Signed)
ty

## 2020-12-21 DIAGNOSIS — F411 Generalized anxiety disorder: Secondary | ICD-10-CM | POA: Diagnosis not present

## 2020-12-21 DIAGNOSIS — G4701 Insomnia due to medical condition: Secondary | ICD-10-CM | POA: Diagnosis not present

## 2020-12-24 ENCOUNTER — Encounter: Payer: Self-pay | Admitting: Dermatology

## 2020-12-26 ENCOUNTER — Other Ambulatory Visit: Payer: Self-pay

## 2020-12-26 ENCOUNTER — Ambulatory Visit (INDEPENDENT_AMBULATORY_CARE_PROVIDER_SITE_OTHER): Payer: Self-pay | Admitting: Dermatology

## 2020-12-26 DIAGNOSIS — L988 Other specified disorders of the skin and subcutaneous tissue: Secondary | ICD-10-CM

## 2020-12-26 NOTE — Progress Notes (Signed)
   Follow-Up Visit   Subjective  Holly Pineda is a 40 y.o. female who presents for the following: Facial Elastosis (Patient here today for botox. ).  The following portions of the chart were reviewed this encounter and updated as appropriate:  Tobacco  Allergies  Meds  Problems  Med Hx  Surg Hx  Fam Hx      Objective  Well appearing patient in no apparent distress; mood and affect are within normal limits.  A focused examination was performed including face. Relevant physical exam findings are noted in the Assessment and Plan.  Objective  Head - Anterior (Face): Rhytides and volume loss.   Images    Assessment & Plan  Elastosis of skin Head - Anterior (Face)  7 units total x 13 = $91   Lower eyelid and forehead treated today     Botox Injection - Head - Anterior (Face) Location: forehead and under eyes   Informed consent: Discussed risks (infection, pain, bleeding, bruising, swelling, allergic reaction, paralysis of nearby muscles, eyelid droop, double vision, neck weakness, difficulty breathing, headache, undesirable cosmetic result, and need for additional treatment) and benefits of the procedure, as well as the alternatives.  Informed consent was obtained.  Preparation: The area was cleansed with alcohol.  Procedure Details:  Botox was injected into the dermis with a 30-gauge needle. Pressure applied to any bleeding. Ice packs offered for swelling.  Lot Number: [id number]  Expiration:  12/2022  Total Units Injected:  7  Plan: Patient was instructed to remain upright for 4 hours. Patient was instructed to avoid massaging the face and avoid vigorous exercise for the rest of the day. Tylenol may be used for headache.  Allow 2 weeks before returning to clinic for additional dosing as needed. Patient will call for any problems.   Return in about 4 months (around 04/28/2021) for botox follow up.  I, Ruthell Rummage, CMA, am acting as scribe for Forest Gleason,  MD.  Documentation: I have reviewed the above documentation for accuracy and completeness, and I agree with the above.  Forest Gleason, MD

## 2020-12-26 NOTE — Patient Instructions (Addendum)
If you have any questions or concerns for your doctor, please call our main line at [phone] and press option 4 to reach your doctor's medical assistant. If no one answers, please leave a voicemail as directed and we will return your call as soon as possible. Messages left after 4 pm will be answered the following business day.   You may also send [place] a message via [place]. We typically respond to MyChart messages within 1-2 business days.  For prescription refills, please ask your pharmacy to contact our office. Our fax number is [phone].  If you have an urgent issue when the clinic is closed that cannot wait until the next business day, you can page your doctor at the number below.    Please note that while we do our best to be available for urgent issues outside of office hours, we are not available 24/7.   If you have an urgent issue and are unable to reach [place], you may choose to seek medical care at your doctor's office, retail clinic, urgent care center, or emergency room.  If you have a medical emergency, please immediately call 911 or go to the emergency department.  Pager Numbers  - Dr. Nehemiah Massed: [phone]  - Dr. Laurence Ferrari: [phone]  - Dr. Nicole Kindred: [phone]  In the event of inclement weather, please call our main line at [phone] for an update on the status of any delays or closures.  Dermatology Medication Tips: Please keep the boxes that topical medications come in in order to help keep track of the instructions about where and how to use these. Pharmacies typically print the medication instructions only on the boxes and not directly on the medication tubes.   If your medication is too expensive, please contact our office at [phone] option 4 or send [place] a message through [place].   We are unable to tell what your co-pay for medications will be in advance as this is different depending on your insurance coverage. However, we may be able to find a substitute  medication at lower cost or fill out paperwork to get insurance to cover a needed medication.   If a prior authorization is required to get your medication covered by your insurance company, please allow [place] 1-2 business days to complete this process.  Drug prices often vary depending on where the prescription is filled and some pharmacies may offer cheaper prices.  The website www.goodrx.com contains coupons for medications through different pharmacies. The prices here do not account for what the cost may be with help from insurance (it may be cheaper with your insurance), but the website can give you the price if you did not use any insurance.  - You can print the associated coupon and take it with your prescription to the pharmacy.  - You may also stop by our office during regular business hours and pick up a GoodRx coupon card.  - If you need your prescription sent electronically to a different pharmacy, notify our office through [facility] or by phone at [phone] option 4.  Dermend apply 4 times daily to areas that are bruised daily.

## 2020-12-30 ENCOUNTER — Encounter: Payer: Self-pay | Admitting: Dermatology

## 2021-01-10 ENCOUNTER — Ambulatory Visit: Payer: BC Managed Care – PPO | Admitting: Nurse Practitioner

## 2021-01-14 ENCOUNTER — Other Ambulatory Visit: Payer: Self-pay | Admitting: Medical

## 2021-01-27 ENCOUNTER — Other Ambulatory Visit: Payer: Self-pay | Admitting: Medical

## 2021-01-27 DIAGNOSIS — E039 Hypothyroidism, unspecified: Secondary | ICD-10-CM

## 2021-01-27 NOTE — Progress Notes (Signed)
Monitoring  TSH level.  Labs placed in Epic.

## 2021-01-27 NOTE — Progress Notes (Signed)
TSH plus panel would not except. Placed TSH onl , accepted.

## 2021-01-30 ENCOUNTER — Other Ambulatory Visit: Payer: Self-pay

## 2021-01-30 ENCOUNTER — Other Ambulatory Visit: Payer: BC Managed Care – PPO

## 2021-01-30 DIAGNOSIS — E039 Hypothyroidism, unspecified: Secondary | ICD-10-CM

## 2021-01-31 LAB — THYROID PANEL WITH TSH
Free Thyroxine Index: 2.4 (ref 1.2–4.9)
T3 Uptake Ratio: 29 % (ref 24–39)
T4, Total: 8.2 ug/dL (ref 4.5–12.0)
TSH: 3.22 u[IU]/mL (ref 0.450–4.500)

## 2021-03-03 ENCOUNTER — Encounter: Payer: Self-pay | Admitting: Medical

## 2021-03-05 ENCOUNTER — Other Ambulatory Visit: Payer: Self-pay | Admitting: Nurse Practitioner

## 2021-03-05 ENCOUNTER — Encounter: Payer: Self-pay | Admitting: Nurse Practitioner

## 2021-03-22 ENCOUNTER — Other Ambulatory Visit: Payer: Self-pay | Admitting: Nurse Practitioner

## 2021-03-24 ENCOUNTER — Other Ambulatory Visit: Payer: Self-pay | Admitting: Medical

## 2021-04-05 ENCOUNTER — Other Ambulatory Visit: Payer: Self-pay | Admitting: Registered Nurse

## 2021-04-05 DIAGNOSIS — J301 Allergic rhinitis due to pollen: Secondary | ICD-10-CM

## 2021-04-18 ENCOUNTER — Other Ambulatory Visit: Payer: Self-pay

## 2021-04-18 ENCOUNTER — Ambulatory Visit: Payer: BC Managed Care – PPO | Admitting: Nurse Practitioner

## 2021-04-18 ENCOUNTER — Encounter: Payer: Self-pay | Admitting: Nurse Practitioner

## 2021-04-18 VITALS — BP 128/75 | HR 89 | Temp 98.5°F | Resp 16 | Ht 60.0 in | Wt 106.0 lb

## 2021-04-18 DIAGNOSIS — R7989 Other specified abnormal findings of blood chemistry: Secondary | ICD-10-CM

## 2021-04-18 DIAGNOSIS — M62830 Muscle spasm of back: Secondary | ICD-10-CM

## 2021-04-18 MED ORDER — CYCLOBENZAPRINE HCL 5 MG PO TABS: 5.0000 mg | ORAL_TABLET | Freq: Every day | ORAL | 0 refills | Status: DC

## 2021-04-18 NOTE — Progress Notes (Signed)
Subjective:    Patient ID: Holly Pineda, female    DOB: [birth year], 40 y.o.   MRN: [id number]  HPI  40 year old female presenting to [facility] with complaints of left shoulder pain This started yesterday morning abruptly after waking up- she believes she slept on her arm wrong. There is a tight knot in her left upper back and she has tingling down her left arm without weakness. She does have pain from back to arm/shoulder on the left.   Denies any heavy lifting or new activities  Today's Vitals   04/18/21 1135  BP: 128/75  Pulse: 89  Resp: 16  Temp: 98.5 F (36.9 C)  TempSrc: Oral  SpO2: 100%  Weight: 106 lb (48.1 kg)  Height: 5' (1.524 m)   Body mass index is 20.7 kg/m.   Past Medical History:  Diagnosis Date   Allergy    Anxiety 02/13/2019   Atypical mole 06/05/2015   right lower back/mild, right lateral abdomen/mild   Atypical mole 08/20/2015   right upper back paraspinal/excision   Atypical mole 08/27/2015   right superior buttock/excision   Atypical mole 02/24/2016   right inframammary/mild   Atypical mole 10/21/2017   left lower back/moderate   Basal cell carcinoma 09/04/2013   left sternum mid chest    Review of Systems  Constitutional: Negative.   HENT: Negative.    Respiratory: Negative.    Cardiovascular: Negative.   Gastrointestinal: Negative.   Musculoskeletal:  Positive for myalgias.  Skin: Negative.     Current Outpatient Medications  Medication Instructions   Azelastine HCl 137 MCG/SPRAY SOLN PLACE 2 SPRAYS INTO BOTH NOSTRILS 2 (TWO) TIMES DAILY. USE IN EACH NOSTRIL AS DIRECTED   clindamycin-benzoyl peroxide (BENZACLIN) gel Apply in morning to spot treat as needed.   levonorgestrel-ethinyl estradiol (ALESSE) 0.1-20 MG-MCG tablet 1 tablet, Oral, Daily   levothyroxine (SYNTHROID) 50 MCG tablet TAKE 1 TABLET BY MOUTH EVERY DAY   Multiple Vitamin (MULTIVITAMIN) capsule 1 capsule, Oral, Daily   QUEtiapine (SEROQUEL) 100  mg, Daily at bedtime   traZODone (DESYREL) 50 MG tablet No dose, route, or frequency recorded.   tretinoin (RETIN-A) 0.05 % cream No dose, route, or frequency recorded.       Objective:   Physical Exam HENT:     Head: Normocephalic.  Musculoskeletal:     Cervical back: Normal range of motion. Tenderness present.       Back:     Comments: Tenderness to area highlighted, muscle knot tender to touch   Skin:    General: Skin is warm.  Neurological:     Mental Status: She is alert and oriented to person, place, and time.     Motor: No weakness.     Comments: Sensation slightly decreased in left arm and fingers to light touch strength intact 5/5 bilateral upper extremities   Psychiatric:        Mood and Affect: Mood normal.          Assessment & Plan:   1. Elevated TSH Patient requested 3 months thyroid panel be checked while here PCP to f/u - Thyroid Panel With TSH  2. Back muscle spasm Massage area, apply heat as discussed, topical Icy Hot, advil '600mg'$  every 6 hours with food +  - cyclobenzaprine (FLEXERIL) 5 MG tablet; Take 1 tablet (5 mg total) by mouth at bedtime.  Dispense: 30 tablet; Refill: 0     Return to clinic with any new or worsening symptoms as  discussed

## 2021-04-19 LAB — THYROID PANEL WITH TSH
Free Thyroxine Index: 2.3 (ref 1.2–4.9)
T3 Uptake Ratio: 27 % (ref 24–39)
T4, Total: 8.5 ug/dL (ref 4.5–12.0)
TSH: 2.3 u[IU]/mL (ref 0.450–4.500)

## 2021-04-20 DIAGNOSIS — M5412 Radiculopathy, cervical region: Secondary | ICD-10-CM | POA: Diagnosis not present

## 2021-04-21 ENCOUNTER — Encounter: Payer: Self-pay | Admitting: Medical

## 2021-05-08 ENCOUNTER — Ambulatory Visit (INDEPENDENT_AMBULATORY_CARE_PROVIDER_SITE_OTHER): Payer: Self-pay | Admitting: Dermatology

## 2021-05-08 ENCOUNTER — Other Ambulatory Visit: Payer: Self-pay

## 2021-05-08 DIAGNOSIS — L988 Other specified disorders of the skin and subcutaneous tissue: Secondary | ICD-10-CM

## 2021-05-08 NOTE — Patient Instructions (Signed)

## 2021-05-08 NOTE — Progress Notes (Signed)
   Follow-Up Visit   Subjective  Holly Pineda is a 40 y.o. female who presents for the following: Facial Elastosis (Patient here today for botox injections. ).   The following portions of the chart were reviewed this encounter and updated as appropriate:   Tobacco  Allergies  Meds  Problems  Med Hx  Surg Hx  Fam Hx      Review of Systems:  No other skin or systemic complaints except as noted in HPI or Assessment and Plan.  Objective  Well appearing patient in no apparent distress; mood and affect are within normal limits.  A focused examination was performed including face. Relevant physical exam findings are noted in the Assessment and Plan.  face Rhytides and volume loss.                         Assessment & Plan  Elastosis of skin face  Filling material injection - face Location: See attached image  Informed consent: Discussed risks (infection, pain, bleeding, bruising, swelling, allergic reaction, paralysis of nearby muscles, eyelid droop, double vision, neck weakness, difficulty breathing, headache, undesirable cosmetic result, and need for additional treatment) and benefits of the procedure, as well as the alternatives.  Informed consent was obtained.  Preparation: The area was cleansed with alcohol.  Procedure Details:  Botox was injected into the dermis with a 30-gauge needle. Pressure applied to any bleeding. Ice packs offered for swelling.  Lot Number:  [id number] Expiration:  11/2022  Total Units Injected:  7  Plan: Tylenol may be used for headache.  Allow 2 weeks before returning to clinic for additional dosing as needed. Patient will call for any problems.   Filling material injection - face Prior to the procedure, the patient's past medical history, allergies and the rare but potential risks and complications were reviewed with the patient and a signed consent was obtained. Pre and post-treatment care was discussed and  instructions provided.  Location: [facility] Type: Olena Heckle  Procedure: The area was prepped thoroughly with Puracyn. A cannula was used to inject the filler. Insertion sites were prepped with puracyn and lidocaine was injected to achieve good local anesthesia. A 23-gauge needle was used to create an opening at the insertion site and then the cannula was inserted using sterile technique. Prior to injecting filler at the desired location, the syringe plunger was drawn back to ensure there was no flash of blood in order to minimize risk of intravascular injection and vascular occlusion.  After injection of the filler, the treated areas were cleansed and iced to reduce swelling. Post-treatment instructions were reviewed with the patient.       Patient tolerated the procedure well. The patient will call with any problems, questions or concerns prior to their next appointment.   Return for TBSE, as scheduled.  Graciella Belton, RMA, am acting as scribe for Forest Gleason, MD .  Documentation: I have reviewed the above documentation for accuracy and completeness, and I agree with the above.  Forest Gleason, MD

## 2021-05-09 ENCOUNTER — Other Ambulatory Visit: Payer: BC Managed Care – PPO

## 2021-05-09 ENCOUNTER — Encounter: Payer: Self-pay | Admitting: Dermatology

## 2021-05-09 ENCOUNTER — Other Ambulatory Visit: Payer: Self-pay | Admitting: Medical

## 2021-05-09 DIAGNOSIS — E039 Hypothyroidism, unspecified: Secondary | ICD-10-CM

## 2021-05-09 DIAGNOSIS — E038 Other specified hypothyroidism: Secondary | ICD-10-CM

## 2021-05-09 DIAGNOSIS — Z113 Encounter for screening for infections with a predominantly sexual mode of transmission: Secondary | ICD-10-CM

## 2021-05-09 NOTE — Progress Notes (Signed)
Patient ID: Holly Pineda, female   DOB: 03/11/1981, 39 y.o.   MRN: [id number]  STI testing and TSH recheck.

## 2021-05-09 NOTE — Progress Notes (Unsigned)
STI screening per patient request TSH for hypothyroidism

## 2021-05-10 LAB — TSH: TSH: 2.67 u[IU]/mL (ref 0.450–4.500)

## 2021-05-10 LAB — RPR QUALITATIVE: RPR Ser Ql: NONREACTIVE

## 2021-05-10 LAB — HIV ANTIBODY (ROUTINE TESTING W REFLEX): HIV Screen 4th Generation wRfx: NONREACTIVE

## 2021-05-13 ENCOUNTER — Other Ambulatory Visit: Payer: Self-pay

## 2021-05-13 ENCOUNTER — Ambulatory Visit (INDEPENDENT_AMBULATORY_CARE_PROVIDER_SITE_OTHER): Payer: BC Managed Care – PPO | Admitting: Dermatology

## 2021-05-13 DIAGNOSIS — L988 Other specified disorders of the skin and subcutaneous tissue: Secondary | ICD-10-CM

## 2021-05-13 LAB — CHLAMYDIA/GONOCOCCUS/TRICHOMONAS, NAA
Chlamydia by NAA: NEGATIVE
Gonococcus by NAA: NEGATIVE
Trich vag by NAA: NEGATIVE

## 2021-05-13 NOTE — Telephone Encounter (Signed)
Please offer patient an appointment at 12:20 today. Thank you!

## 2021-05-13 NOTE — Telephone Encounter (Signed)
Yes, just add her at 4:30 please. Also please recommend not massaging the area until I evaluate, and let's ask her to please wait on the dermaplaning until the following week so I can check her first to be safe. Thank you!

## 2021-05-13 NOTE — Telephone Encounter (Signed)
Due to patient's work schedule she can't come in today at 12:20pm and she doesn't get off until 4:15 pm. Her son does have an appt with you on Thursday at 4:15 pm. Is it ok for her to be seen then?

## 2021-05-15 ENCOUNTER — Ambulatory Visit: Payer: BC Managed Care – PPO | Admitting: Dermatology

## 2021-05-15 ENCOUNTER — Encounter: Payer: Self-pay | Admitting: Nurse Practitioner

## 2021-05-19 ENCOUNTER — Other Ambulatory Visit: Payer: Self-pay

## 2021-05-19 ENCOUNTER — Ambulatory Visit: Payer: BC Managed Care – PPO | Admitting: Medical

## 2021-05-19 DIAGNOSIS — R143 Flatulence: Secondary | ICD-10-CM

## 2021-05-19 DIAGNOSIS — E039 Hypothyroidism, unspecified: Secondary | ICD-10-CM

## 2021-05-19 NOTE — Progress Notes (Signed)
40 yo female in non acute distress consents to telemedicine appointment.  Reviewed STI testing with patient all negative results. Reviewed TSH, this actually was a 3 month order that mistakenly got drawn. Reviewed with patient itching in the suprapubic area, denies rash,  only occurs occasionally, ao changes in discharge. Recommend she try OTC external vaginal antifungal cream. She does shave the area. She has complaints of gas, no blood in toilet or in stool or with wiping. Recommended she try [facility], if this does not work offered to send patient to GI. She wants to hold off at this time. Patient verbalizes understanding all instructions and has no questions at the end of our conversation.

## 2021-05-21 ENCOUNTER — Encounter: Payer: Self-pay | Admitting: Dermatology

## 2021-05-22 ENCOUNTER — Encounter: Payer: Self-pay | Admitting: Dermatology

## 2021-05-22 NOTE — Progress Notes (Signed)
   Follow-Up Visit   Subjective  Holly Pineda is a 40 y.o. female who presents for the following: Noticed some bumps at filler sites.    The following portions of the chart were reviewed this encounter and updated as appropriate:   Tobacco  Allergies  Meds  Problems  Med Hx  Surg Hx  Fam Hx      Review of Systems:  No other skin or systemic complaints except as noted in HPI or Assessment and Plan.  Objective  Well appearing patient in no apparent distress; mood and affect are within normal limits.  A focused examination was performed including face. Relevant physical exam findings are noted in the Assessment and Plan.    Assessment & Plan  Elastosis of skin Head - Anterior (Face)  Good response to undereye filler. Advised that some subcutanous bumps (bumps under the skin) are normal after filler and should not be massaged out or she will lose the lift from the filler. Reassured normal.   No follow-ups on file.   Documentation: I have reviewed the above documentation for accuracy and completeness, and I agree with the above.  Forest Gleason, MD

## 2021-05-27 ENCOUNTER — Other Ambulatory Visit: Payer: Self-pay | Admitting: Medical

## 2021-05-28 ENCOUNTER — Encounter: Payer: Self-pay | Admitting: Dermatology

## 2021-06-03 ENCOUNTER — Encounter: Payer: Self-pay | Admitting: Medical

## 2021-06-03 ENCOUNTER — Other Ambulatory Visit: Payer: Self-pay | Admitting: Medical

## 2021-06-03 DIAGNOSIS — Z760 Encounter for issue of repeat prescription: Secondary | ICD-10-CM

## 2021-06-03 DIAGNOSIS — J301 Allergic rhinitis due to pollen: Secondary | ICD-10-CM

## 2021-06-03 DIAGNOSIS — E038 Other specified hypothyroidism: Secondary | ICD-10-CM

## 2021-06-03 DIAGNOSIS — E063 Autoimmune thyroiditis: Secondary | ICD-10-CM

## 2021-06-03 MED ORDER — LEVOTHYROXINE SODIUM 50 MCG PO TABS: 50.0000 ug | ORAL_TABLET | Freq: Every day | ORAL | 2 refills | Status: DC

## 2021-06-03 MED ORDER — AZELASTINE HCL 137 MCG/SPRAY NA SOLN: NASAL | 3 refills | Status: DC

## 2021-06-03 NOTE — Progress Notes (Signed)
Patient asks for refill on her hypothyroid medication and her Azelastine   DX hypothyroidism Seasonal allergies  Meds ordered this encounter  Medications   DISCONTD: levothyroxine (SYNTHROID) 50 MCG tablet    Sig: Take 1 tablet (50 mcg total) by mouth daily.    Dispense:  30 tablet    Refill:  2   Azelastine HCl 137 MCG/SPRAY SOLN    Sig: PLACE 2 SPRAYS INTO BOTH NOSTRILS 2 (TWO) TIMES DAILY. USE IN EACH NOSTRIL AS DIRECTED    Dispense:  30 mL    Refill:  3    Rt to clinic prn.

## 2021-06-09 ENCOUNTER — Other Ambulatory Visit: Payer: Self-pay

## 2021-06-09 ENCOUNTER — Encounter: Payer: Self-pay | Admitting: Medical

## 2021-06-09 ENCOUNTER — Ambulatory Visit: Payer: BC Managed Care – PPO | Admitting: Medical

## 2021-06-09 VITALS — BP 102/64 | HR 98 | Temp 98.1°F | Resp 16 | Wt 105.2 lb

## 2021-06-09 DIAGNOSIS — E038 Other specified hypothyroidism: Secondary | ICD-10-CM

## 2021-06-09 DIAGNOSIS — Z79899 Other long term (current) drug therapy: Secondary | ICD-10-CM

## 2021-06-09 DIAGNOSIS — Z0000 Encounter for general adult medical examination without abnormal findings: Secondary | ICD-10-CM

## 2021-06-09 NOTE — Progress Notes (Signed)
Subjective:    Patient ID: Holly Pineda, female    DOB: 18-Feb-1981, 40 y.o.   MRN: [id number]  HPI 40 yo female in non acute distress presents today with questions about her thyroid numbers. She is possibly planning on getting pregnant and wanted her number  to be optimized. She is recently divorced and has a new partner. Works at [employer] x 22 years.  OB/Gyn Ms Shary Decamp.  Past Medical History:  Diagnosis Date   Allergy    Anxiety 02/13/2019   Atypical mole 06/05/2015   right lower back/mild, right lateral abdomen/mild   Atypical mole 08/20/2015   right upper back paraspinal/excision   Atypical mole 08/27/2015   right superior buttock/excision   Atypical mole 02/24/2016   right inframammary/mild   Atypical mole 10/21/2017   left lower back/moderate   Basal cell carcinoma 09/04/2013   left sternum mid chest    Past Surgical History:  Procedure Laterality Date   AUGMENTATION MAMMAPLASTY Bilateral 2017   CESAREAN SECTION     CHOLECYSTECTOMY      Current Outpatient Medications:    Azelastine HCl 137 MCG/SPRAY SOLN, PLACE 2 SPRAYS INTO BOTH NOSTRILS 2 (TWO) TIMES DAILY. USE IN EACH NOSTRIL AS DIRECTED, Disp: 30 mL, Rfl: 3   clindamycin-benzoyl peroxide (BENZACLIN) gel, Apply in morning to spot treat as needed., Disp: 50 g, Rfl: 2   levonorgestrel-ethinyl estradiol (ALESSE) 0.1-20 MG-MCG tablet, Take 1 tablet by mouth daily., Disp: , Rfl:    levothyroxine (SYNTHROID) 50 MCG tablet, Take 1 tablet (50 mcg total) by mouth daily., Disp: 30 tablet, Rfl: 2   Multiple Vitamin (MULTIVITAMIN) capsule, Take 1 capsule by mouth daily., Disp: , Rfl:    tretinoin (RETIN-A) 0.05 % cream, , Disp: , Rfl:     No Known Allergies   Review of Systems  Constitutional:  Negative for chills and fever.  HENT:  Positive for congestion. Negative for ear pain and sore throat.   Respiratory:  Negative for choking and shortness of breath.   Cardiovascular:  Negative for chest  pain.  Gastrointestinal:  Negative for abdominal pain and diarrhea.  Genitourinary:  Negative for difficulty urinating.  Allergic/Immunologic: Positive for environmental allergies.  Neurological:  Negative for dizziness, syncope, light-headedness and headaches.  Still having periods.     Objective:   Physical Exam Constitutional:      Appearance: She is normal weight.  Eyes:     Extraocular Movements: Extraocular movements intact.     Conjunctiva/sclera: Conjunctivae normal.     Pupils: Pupils are equal, round, and reactive to light.  Pulmonary:     Effort: Pulmonary effort is normal.  Musculoskeletal:        General: Normal range of motion.  Skin:    General: Skin is warm and dry.  Neurological:     General: No focal deficit present.     Mental Status: She is alert and oriented to person, place, and time. Mental status is at baseline.  Psychiatric:        Mood and Affect: Mood normal.        Behavior: Behavior normal.        Thought Content: Thought content normal.        Judgment: Judgment normal.          Assessment & Plan:  Medication management. Hypothyroidism Encouraged patient to discuss with OB/GYN about pregnancy in her  68's and perimenopause, menopause.To ask about Testosterone and Estrogen testing.  After patient she realized she forgot  to show me a lump on her neck ( sent me a MyChart message), will have Colleague evaluate when she returns on Friday. Patient verbalizes understanding and has no questions at discharge. Returns on Friday for blood work, placed in computer, Possible Flu shot on Friday will recheck with patient.

## 2021-06-13 ENCOUNTER — Encounter: Payer: Self-pay | Admitting: Nurse Practitioner

## 2021-06-13 ENCOUNTER — Ambulatory Visit: Payer: BC Managed Care – PPO | Admitting: Nurse Practitioner

## 2021-06-13 ENCOUNTER — Other Ambulatory Visit: Payer: Self-pay

## 2021-06-13 ENCOUNTER — Other Ambulatory Visit: Payer: BC Managed Care – PPO

## 2021-06-13 VITALS — BP 108/64 | HR 89 | Temp 99.5°F | Resp 16

## 2021-06-13 DIAGNOSIS — Z0000 Encounter for general adult medical examination without abnormal findings: Secondary | ICD-10-CM

## 2021-06-13 DIAGNOSIS — Z711 Person with feared health complaint in whom no diagnosis is made: Secondary | ICD-10-CM

## 2021-06-13 NOTE — Progress Notes (Signed)
   Subjective:    Patient ID: Holly Pineda, female    DOB: 31-Oct-1980, 40 y.o.   MRN: [id number]  HPI  40 year old female presenting to [facility] with complaints enlargement to her anterior neck. She denies feeling an actual lump, just feels as though her neck around her throat has looked noticeably enlarged.   Today's Vitals   06/13/21 0814  BP: 108/64  Pulse: 89  Resp: 16  Temp: 99.5 F (37.5 C)  TempSrc: Tympanic  SpO2: 99%   There is no height or weight on file to calculate BMI.   Past Medical History:  Diagnosis Date   Allergy    Anxiety 02/13/2019   Atypical mole 06/05/2015   right lower back/mild, right lateral abdomen/mild   Atypical mole 08/20/2015   right upper back paraspinal/excision   Atypical mole 08/27/2015   right superior buttock/excision   Atypical mole 02/24/2016   right inframammary/mild   Atypical mole 10/21/2017   left lower back/moderate   Basal cell carcinoma 09/04/2013   left sternum mid chest    Current Outpatient Medications  Medication Instructions   Azelastine HCl 137 MCG/SPRAY SOLN PLACE 2 SPRAYS INTO BOTH NOSTRILS 2 (TWO) TIMES DAILY. USE IN EACH NOSTRIL AS DIRECTED   clindamycin-benzoyl peroxide (BENZACLIN) gel Apply in morning to spot treat as needed.   levonorgestrel-ethinyl estradiol (ALESSE) 0.1-20 MG-MCG tablet 1 tablet, Oral, Daily   levothyroxine (SYNTHROID) 50 mcg, Oral, Daily   Multiple Vitamin (MULTIVITAMIN) capsule 1 capsule, Oral, Daily   tretinoin (RETIN-A) 0.05 % cream No dose, route, or frequency recorded.        Objective:   Physical Exam Constitutional:      Appearance: Normal appearance.  HENT:     Head: Normocephalic.  Eyes:     Pupils: Pupils are equal, round, and reactive to light.  Neck:     Thyroid: No thyroid mass or thyroid tenderness.  Pulmonary:     Effort: Pulmonary effort is normal.  Musculoskeletal:     Cervical back: Normal range of motion and neck supple. No  tenderness.  Lymphadenopathy:     Cervical: No cervical adenopathy.  Skin:    General: Skin is warm.  Neurological:     General: No focal deficit present.     Mental Status: She is alert.  Psychiatric:        Mood and Affect: Mood normal.          Assessment & Plan:  1. Feared complaint without diagnosis Discussed with patient continuing to monitor area. No obvious enlargement on exam, no palpable masses.  RTC as needed

## 2021-06-14 LAB — CMP12+LP+TP+TSH+6AC+CBC/D/PLT
ALT: 14 IU/L (ref 0–32)
AST: 16 IU/L (ref 0–40)
Albumin/Globulin Ratio: 2.1 (ref 1.2–2.2)
Albumin: 4.7 g/dL (ref 3.8–4.8)
Alkaline Phosphatase: 53 IU/L (ref 44–121)
BUN/Creatinine Ratio: 24 — ABNORMAL HIGH (ref 9–23)
BUN: 18 mg/dL (ref 6–24)
Basophils Absolute: 0 10*3/uL (ref 0.0–0.2)
Basos: 1 %
Bilirubin Total: <0.2 mg/dL (ref 0.0–1.2)
Calcium: 10.1 mg/dL (ref 8.7–10.2)
Chloride: 103 mmol/L (ref 96–106)
Chol/HDL Ratio: 2.8 ratio (ref 0.0–4.4)
Cholesterol, Total: 187 mg/dL (ref 100–199)
Creatinine, Ser: 0.76 mg/dL (ref 0.57–1.00)
EOS (ABSOLUTE): 0.2 10*3/uL (ref 0.0–0.4)
Eos: 3 %
Estimated CHD Risk: <0.5 times avg. (ref 0.0–1.0)
Free Thyroxine Index: 2.1 (ref 1.2–4.9)
GGT: 7 IU/L (ref 0–60)
Globulin, Total: 2.2 g/dL (ref 1.5–4.5)
Glucose: 88 mg/dL (ref 70–99)
HDL: 66 mg/dL (ref >39)
Hematocrit: 39.4 % (ref 34.0–46.6)
Hemoglobin: 13.2 g/dL (ref 11.1–15.9)
Immature Grans (Abs): 0 10*3/uL (ref 0.0–0.1)
Immature Granulocytes: 0 %
Iron: 135 ug/dL (ref 27–159)
LDH: 125 IU/L (ref 119–226)
LDL Chol Calc (NIH): 106 mg/dL — ABNORMAL HIGH (ref 0–99)
Lymphocytes Absolute: 1.8 10*3/uL (ref 0.7–3.1)
Lymphs: 31 %
MCH: 31.1 pg (ref 26.6–33.0)
MCHC: 33.5 g/dL (ref 31.5–35.7)
MCV: 93 fL (ref 79–97)
Monocytes Absolute: 0.5 10*3/uL (ref 0.1–0.9)
Monocytes: 8 %
Neutrophils Absolute: 3.3 10*3/uL (ref 1.4–7.0)
Neutrophils: 57 %
Phosphorus: 5 mg/dL — ABNORMAL HIGH (ref 3.0–4.3)
Platelets: 230 10*3/uL (ref 150–450)
Potassium: 4.8 mmol/L (ref 3.5–5.2)
RBC: 4.25 x10E6/uL (ref 3.77–5.28)
RDW: 11.9 % (ref 11.7–15.4)
Sodium: 139 mmol/L (ref 134–144)
T3 Uptake Ratio: 27 % (ref 24–39)
T4, Total: 7.7 ug/dL (ref 4.5–12.0)
TSH: 2.52 u[IU]/mL (ref 0.450–4.500)
Total Protein: 6.9 g/dL (ref 6.0–8.5)
Triglycerides: 85 mg/dL (ref 0–149)
Uric Acid: 3.9 mg/dL (ref 2.6–6.2)
VLDL Cholesterol Cal: 15 mg/dL (ref 5–40)
WBC: 5.8 10*3/uL (ref 3.4–10.8)
eGFR: 100 mL/min/{1.73_m2} (ref >59)

## 2021-06-14 LAB — B12 AND FOLATE PANEL
Folate: >20 ng/mL (ref >3.0)
Vitamin B-12: 477 pg/mL (ref 232–1245)

## 2021-06-14 LAB — VITAMIN D 25 HYDROXY (VIT D DEFICIENCY, FRACTURES): Vit D, 25-Hydroxy: 32.7 ng/mL (ref 30.0–100.0)

## 2021-06-14 LAB — HGB A1C W/O EAG: Hgb A1c MFr Bld: 5.2 % (ref 4.8–5.6)

## 2021-06-18 ENCOUNTER — Encounter: Payer: Self-pay | Admitting: Medical

## 2021-07-08 ENCOUNTER — Other Ambulatory Visit: Payer: BC Managed Care – PPO

## 2021-07-15 ENCOUNTER — Other Ambulatory Visit: Payer: Self-pay

## 2021-07-15 ENCOUNTER — Encounter: Payer: Self-pay | Admitting: Medical

## 2021-07-15 ENCOUNTER — Ambulatory Visit: Payer: BC Managed Care – PPO | Admitting: Medical

## 2021-07-15 VITALS — BP 122/74 | HR 107 | Temp 97.7°F | Resp 16 | Wt 105.4 lb

## 2021-07-15 DIAGNOSIS — Z0000 Encounter for general adult medical examination without abnormal findings: Secondary | ICD-10-CM

## 2021-07-15 LAB — POCT URINALYSIS DIPSTICK
Bilirubin, UA: NEGATIVE
Glucose, UA: NEGATIVE
Ketones, UA: NEGATIVE
Leukocytes, UA: NEGATIVE
Nitrite, UA: NEGATIVE
Protein, UA: NEGATIVE
Spec Grav, UA: 1.015 (ref 1.010–1.025)
Urobilinogen, UA: 0.2 E.U./dL
pH, UA: 5 (ref 5.0–8.0)

## 2021-07-15 NOTE — Progress Notes (Signed)
Subjective:    Patient ID: Holly Pineda, female    DOB: Aug 01, 1981, 40 y.o.   MRN: [id number]  HPI 40 yo female in non acute distress. No complaints. Has an Appt with OB/GYN Jan 12th.   Spotting , period early x 9 days., light bleeding.    Declines flu vaccinated. Blood pressure 122/74, pulse (!) 107, temperature 97.7 F (36.5 C), temperature source Tympanic, resp. rate 16, weight 105 lb 6.4 oz (47.8 kg), last menstrual period 07/14/2021, SpO2 99 %.  No Known Allergies  Past Medical History:  Diagnosis Date   Allergy    Anxiety 02/13/2019   Atypical mole 06/05/2015   right lower back/mild, right lateral abdomen/mild   Atypical mole 08/20/2015   right upper back paraspinal/excision   Atypical mole 08/27/2015   right superior buttock/excision   Atypical mole 02/24/2016   right inframammary/mild   Atypical mole 10/21/2017   left lower back/moderate   Basal cell carcinoma 09/04/2013   left sternum mid chest    Past Surgical History:  Procedure Laterality Date   AUGMENTATION MAMMAPLASTY Bilateral 2017   CESAREAN SECTION     CHOLECYSTECTOMY      Family History  Problem Relation Age of Onset   Dementia Maternal Grandmother    Liver cancer Paternal Grandmother    Breast cancer Maternal Great-grandmother     Social History   Socioeconomic History   Marital status: Married    Spouse name: Not on file   Number of children: Not on file   Years of education: Not on file   Highest education level: Not on file  Occupational History   Not on file  Tobacco Use   Smoking status: Former    Packs/day: 1.00    Years: 20.00    Pack years: 20.00    Types: Cigarettes    Quit date: 08/18/2018    Years since quitting: 2.9   Smokeless tobacco: Never  Vaping Use   Vaping Use: Never used  Substance and Sexual Activity   Alcohol use: No   Drug use: No   Sexual activity: Yes    Birth control/protection: None, Condom  Other Topics Concern   Not on file  Social  History Narrative   Not on file   Social Determinants of Health   Financial Resource Strain: Not on file  Food Insecurity: Not on file  Transportation Needs: Not on file  Physical Activity: Not on file  Stress: Not on file  Social Connections: Not on file  Intimate Partner Violence: Not on file    Review of Systems  Allergic/Immunologic: Negative for environmental allergies and food allergies.  All other systems reviewed and are negative.     Objective:   Physical Exam Vitals and nursing note reviewed.  Constitutional:      Appearance: Normal appearance.  HENT:     Head: Normocephalic and atraumatic.     Right Ear: Tympanic membrane, ear canal and external ear normal.     Left Ear: Tympanic membrane, ear canal and external ear normal.     Nose: Nose normal.     Mouth/Throat:     Mouth: Mucous membranes are dry.     Pharynx: Oropharynx is clear.  Cardiovascular:     Rate and Rhythm: Normal rate and regular rhythm.  Pulmonary:     Effort: Pulmonary effort is normal.     Breath sounds: Normal breath sounds.  Musculoskeletal:        General: Normal range of motion.  Cervical back: Normal range of motion and neck supple.  Skin:    General: Skin is warm and dry.     Capillary Refill: Capillary refill takes less than 2 seconds.  Neurological:     General: No focal deficit present.     Mental Status: She is alert and oriented to person, place, and time.  Psychiatric:        Mood and Affect: Mood normal.        Behavior: Behavior normal.        Thought Content: Thought content normal.        Judgment: Judgment normal.    Declines breast and rectal exxam       Assessment & Plan:  Annual  physical exam Continue current medications. Return to the clinic as needed. Patient verbalizes understanding and has no questions at discharge.

## 2021-07-23 ENCOUNTER — Ambulatory Visit: Payer: BC Managed Care – PPO | Admitting: Dermatology

## 2021-08-14 DIAGNOSIS — Z1231 Encounter for screening mammogram for malignant neoplasm of breast: Secondary | ICD-10-CM | POA: Diagnosis not present

## 2021-08-14 DIAGNOSIS — Z01419 Encounter for gynecological examination (general) (routine) without abnormal findings: Secondary | ICD-10-CM | POA: Diagnosis not present

## 2021-08-14 DIAGNOSIS — Z3169 Encounter for other general counseling and advice on procreation: Secondary | ICD-10-CM | POA: Diagnosis not present

## 2021-08-18 ENCOUNTER — Other Ambulatory Visit: Payer: BC Managed Care – PPO | Admitting: Medical

## 2021-08-19 ENCOUNTER — Other Ambulatory Visit: Payer: BC Managed Care – PPO

## 2021-08-19 ENCOUNTER — Other Ambulatory Visit: Payer: Self-pay

## 2021-08-19 DIAGNOSIS — Z3169 Encounter for other general counseling and advice on procreation: Secondary | ICD-10-CM

## 2021-08-24 LAB — CBC WITH DIFFERENTIAL/PLATELET
Basophils Absolute: 0 10*3/uL (ref 0.0–0.2)
Basos: 1 %
EOS (ABSOLUTE): 0.2 10*3/uL (ref 0.0–0.4)
Eos: 4 %
Hematocrit: 39.5 % (ref 34.0–46.6)
Hemoglobin: 13.5 g/dL (ref 11.1–15.9)
Immature Grans (Abs): 0 10*3/uL (ref 0.0–0.1)
Immature Granulocytes: 0 %
Lymphocytes Absolute: 1.7 10*3/uL (ref 0.7–3.1)
Lymphs: 34 %
MCH: 31.6 pg (ref 26.6–33.0)
MCHC: 34.2 g/dL (ref 31.5–35.7)
MCV: 93 fL (ref 79–97)
Monocytes Absolute: 0.4 10*3/uL (ref 0.1–0.9)
Monocytes: 8 %
Neutrophils Absolute: 2.7 10*3/uL (ref 1.4–7.0)
Neutrophils: 53 %
Platelets: 245 10*3/uL (ref 150–450)
RBC: 4.27 x10E6/uL (ref 3.77–5.28)
RDW: 11.4 % — ABNORMAL LOW (ref 11.7–15.4)
WBC: 5 10*3/uL (ref 3.4–10.8)

## 2021-08-24 LAB — TSH: TSH: 2.65 u[IU]/mL (ref 0.450–4.500)

## 2021-08-24 LAB — ESTRADIOL: Estradiol: 62.5 pg/mL

## 2021-08-24 LAB — FOLLICLE STIMULATING HORMONE: FSH: 10.7 m[IU]/mL

## 2021-08-24 LAB — PROLACTIN: Prolactin: 22.4 ng/mL (ref 4.8–23.3)

## 2021-08-24 LAB — ANTI MULLERIAN HORMONE: ANTI-MULLERIAN HORMONE (AMH): 0.37 ng/mL

## 2021-08-25 ENCOUNTER — Ambulatory Visit: Payer: BC Managed Care – PPO | Admitting: Medical

## 2021-08-26 ENCOUNTER — Encounter: Payer: Self-pay | Admitting: Medical

## 2021-08-26 ENCOUNTER — Other Ambulatory Visit: Payer: Self-pay

## 2021-08-26 ENCOUNTER — Ambulatory Visit: Payer: BC Managed Care – PPO | Admitting: Medical

## 2021-08-26 ENCOUNTER — Telehealth: Payer: Self-pay | Admitting: Nurse Practitioner

## 2021-08-26 VITALS — BP 102/68 | HR 89 | Temp 97.9°F | Resp 16

## 2021-08-26 DIAGNOSIS — E039 Hypothyroidism, unspecified: Secondary | ICD-10-CM

## 2021-08-26 DIAGNOSIS — Z789 Other specified health status: Secondary | ICD-10-CM

## 2021-08-26 NOTE — Telephone Encounter (Signed)
Whitney, Will you review chart? I received referral on this patient for hypothyroidism.I am not seeing any abnormal labs to go off to schedule? Please let me know if I am missing something. Thanks.

## 2021-08-26 NOTE — Telephone Encounter (Signed)
I see where her labs were abnormal back in 2021 but not most recently.  She isn't on any medications for her thyroid.  I think it is all coming from her desire to become pregnant.  This may be a better question for Dr. Dorris Fetch to answer.

## 2021-08-26 NOTE — Telephone Encounter (Signed)
Called pt, no answer. Patient can be scheduled with Dr Dorris Fetch.

## 2021-08-26 NOTE — Progress Notes (Signed)
° °  Subjective:    Patient ID: Holly Pineda, female    DOB: November 12, 1980, 41 y.o.   MRN: [id number]  HPI 41 yo female in non acute distress.  Here to discuss her TSH levels. And Pregnacy difficulties. She would like her TSH at  2.5,  OB/GYN recommended that number or lower to help with pregnancy.  Also had a history of right shoulder pain, but no pain today.  She had increased pain on Friday and used a muscle relaxer over the weekend which she says helped her pain and seen by [facility] doctor which she feels helps.   Blood pressure 102/68, pulse 89, temperature 97.9 F (36.6 C), temperature source Tympanic, resp. rate 16, SpO2 99 %.  No Known Allergies  Review of Systems  Constitutional:  Negative for chills and fever.  Musculoskeletal:        No shoulder pain today.   Mensis somewhat irregular.     Objective:   Physical Exam Vitals and nursing note reviewed.  Constitutional:      Appearance: Normal appearance.  HENT:     Head: Normocephalic and atraumatic.  Eyes:     Extraocular Movements: Extraocular movements intact.     Conjunctiva/sclera: Conjunctivae normal.     Pupils: Pupils are equal, round, and reactive to light.  Pulmonary:     Effort: Pulmonary effort is normal.  Musculoskeletal:        General: Normal range of motion.  Skin:    General: Skin is warm and dry.  Neurological:     General: No focal deficit present.     Mental Status: She is alert and oriented to person, place, and time. Mental status is at baseline.  Psychiatric:        Mood and Affect: Mood normal.        Behavior: Behavior normal.        Thought Content: Thought content normal.    FROM of right shoulder.       Assessment & Plan:  Hypothyroidism Trying to get pregnant. Right shoulder pain, resolved for now. If she needs  a steroid taper dose pak I will be happy to prescribe her one. Recommended Voltaren gel. Labs faxed to [facility] clinic that patient requests.she has a  recheck  of her labs in 21 days from her last labs scheduled for  Feb 3rd 2023. Endocrinology referral she would like to find the root cause of her hypothyroidism and would like her TSH lower than  2.5.. Would also like to know if Gluten free diet helps with thyroid regulation. Referred patient to Endocrinology, Dr. Dorris Fetch. Return to the clinic as needed. Patient verbalizes understanding and has no questions at discharge.

## 2021-08-30 ENCOUNTER — Other Ambulatory Visit: Payer: Self-pay | Admitting: Medical

## 2021-08-30 DIAGNOSIS — E038 Other specified hypothyroidism: Secondary | ICD-10-CM

## 2021-09-02 ENCOUNTER — Encounter: Payer: Self-pay | Admitting: Medical

## 2021-09-03 ENCOUNTER — Other Ambulatory Visit: Payer: Self-pay | Admitting: Medical

## 2021-09-03 DIAGNOSIS — E039 Hypothyroidism, unspecified: Secondary | ICD-10-CM

## 2021-09-03 NOTE — Progress Notes (Signed)
Patient trying to get pregnant and OB/GYN told patient she needed to be at  a  TSH  2.50. She asked for an endocrinology referral. Per patient request.  06/13/21 TSH 2.52,  08/19/21 TSH 2.650.Marland Kitchen  Recent Results (from the past 2160 hour(s))  B12 and Folate Panel     Status: None   Collection Time: 06/13/21  8:13 AM  Result Value Ref Range   Vitamin B-12 477 232 - 1,245 pg/mL   Folate >20.0 >3.0 ng/mL    Comment: A serum folate concentration of less than 3.1 ng/mL is considered to represent clinical deficiency.   VITAMIN D 25 Hydroxy (Vit-D Deficiency, Fractures)     Status: None   Collection Time: 06/13/21  8:13 AM  Result Value Ref Range   Vit D, 25-Hydroxy 32.7 30.0 - 100.0 ng/mL    Comment: Vitamin D deficiency has been defined by the [place] practice guideline as a level of serum 25-OH vitamin D less than 20 ng/mL (1,2). The Endocrine Society went on to further define vitamin D insufficiency as a level between 21 and 29 ng/mL (2). 1. IOM (Institute of Medicine). 2010. Dietary reference    intakes for calcium and D. [place]: The    [employer]. 2. Holick MF, Binkley Roxie, Bischoff-Ferrari HA, et al.    Evaluation, treatment, and prevention of vitamin D    deficiency: an Endocrine Society clinical practice    guideline. JCEM. 2011 Jul; 96(7):1911-30.   CMP12+LP+TP+TSH+6AC+CBC/D/Plt     Status: Abnormal   Collection Time: 06/13/21  8:13 AM  Result Value Ref Range   Glucose 88 70 - 99 mg/dL   Uric Acid 3.9 2.6 - 6.2 mg/dL    Comment:            Therapeutic target for gout patients: <6.0   BUN 18 6 - 24 mg/dL   Creatinine, Ser 0.76 0.57 - 1.00 mg/dL   eGFR 100 >59 mL/min/1.73   BUN/Creatinine Ratio 24 (H) 9 - 23   Sodium 139 134 - 144 mmol/L   Potassium 4.8 3.5 - 5.2 mmol/L   Chloride 103 96 - 106 mmol/L   Calcium 10.1 8.7 - 10.2 mg/dL   Phosphorus 5.0 (H) 3.0 - 4.3 mg/dL   Total Protein 6.9 6.0 - 8.5 g/dL   Albumin 4.7  3.8 - 4.8 g/dL   Globulin, Total 2.2 1.5 - 4.5 g/dL   Albumin/Globulin Ratio 2.1 1.2 - 2.2   Bilirubin Total <0.2 0.0 - 1.2 mg/dL   Alkaline Phosphatase 53 44 - 121 IU/L   LDH 125 119 - 226 IU/L   AST 16 0 - 40 IU/L   ALT 14 0 - 32 IU/L   GGT 7 0 - 60 IU/L   Iron 135 27 - 159 ug/dL   Cholesterol, Total 187 100 - 199 mg/dL   Triglycerides 85 0 - 149 mg/dL   HDL 66 >39 mg/dL   VLDL Cholesterol Cal 15 5 - 40 mg/dL   LDL Chol Calc (NIH) 106 (H) 0 - 99 mg/dL   Chol/HDL Ratio 2.8 0.0 - 4.4 ratio    Comment:                                   T. Chol/HDL Ratio  Men  Women                               1/2 Avg.Risk  3.4    3.3                                   Avg.Risk  5.0    4.4                                2X Avg.Risk  9.6    7.1                                3X Avg.Risk 23.4   11.0    Estimated CHD Risk  < 0.5 0.0 - 1.0 times avg.    Comment: The CHD Risk is based on the T. Chol/HDL ratio. Other factors affect CHD Risk such as hypertension, smoking, diabetes, severe obesity, and family history of premature CHD.    TSH 2.520 0.450 - 4.500 uIU/mL   T4, Total 7.7 4.5 - 12.0 ug/dL   T3 Uptake Ratio 27 24 - 39 %   Free Thyroxine Index 2.1 1.2 - 4.9   WBC 5.8 3.4 - 10.8 x10E3/uL   RBC 4.25 3.77 - 5.28 x10E6/uL   Hemoglobin 13.2 11.1 - 15.9 g/dL   Hematocrit 39.4 34.0 - 46.6 %   MCV 93 79 - 97 fL   MCH 31.1 26.6 - 33.0 pg   MCHC 33.5 31.5 - 35.7 g/dL   RDW 11.9 11.7 - 15.4 %   Platelets 230 150 - 450 x10E3/uL   Neutrophils 57 Not Estab. %   Lymphs 31 Not Estab. %   Monocytes 8 Not Estab. %   Eos 3 Not Estab. %   Basos 1 Not Estab. %   Neutrophils Absolute 3.3 1.4 - 7.0 x10E3/uL   Lymphocytes Absolute 1.8 0.7 - 3.1 x10E3/uL   Monocytes Absolute 0.5 0.1 - 0.9 x10E3/uL   EOS (ABSOLUTE) 0.2 0.0 - 0.4 x10E3/uL   Basophils Absolute 0.0 0.0 - 0.2 x10E3/uL   Immature Granulocytes 0 Not Estab. %   Immature Grans (Abs) 0.0 0.0 - 0.1  x10E3/uL  Hgb A1c w/o eAG     Status: None   Collection Time: 06/13/21  8:13 AM  Result Value Ref Range   Hgb A1c MFr Bld 5.2 4.8 - 5.6 %    Comment:          Prediabetes: 5.7 - 6.4          Diabetes: >6.4          Glycemic control for adults with diabetes: <7.0   POCT urinalysis dipstick     Status: Normal   Collection Time: 07/15/21 11:27 AM  Result Value Ref Range   Color, UA yellow    Clarity, UA clear    Glucose, UA Negative Negative   Bilirubin, UA Negative    Ketones, UA Negative    Spec Grav, UA 1.015 1.010 - 1.025    Comment: s   Blood, UA Large    pH, UA 5.0 5.0 - 8.0   Protein, UA Negative Negative   Urobilinogen, UA 0.2 0.2 or 1.0 E.U./dL   Nitrite, UA Negative    Leukocytes, UA Negative Negative  Appearance clear    Odor    CBC with Differential/Platelet     Status: Abnormal   Collection Time: 08/19/21  8:19 AM  Result Value Ref Range   WBC 5.0 3.4 - 10.8 x10E3/uL   RBC 4.27 3.77 - 5.28 x10E6/uL   Hemoglobin 13.5 11.1 - 15.9 g/dL   Hematocrit 39.5 34.0 - 46.6 %   MCV 93 79 - 97 fL   MCH 31.6 26.6 - 33.0 pg   MCHC 34.2 31.5 - 35.7 g/dL   RDW 11.4 (L) 11.7 - 15.4 %   Platelets 245 150 - 450 x10E3/uL   Neutrophils 53 Not Estab. %   Lymphs 34 Not Estab. %   Monocytes 8 Not Estab. %   Eos 4 Not Estab. %   Basos 1 Not Estab. %   Neutrophils Absolute 2.7 1.4 - 7.0 x10E3/uL   Lymphocytes Absolute 1.7 0.7 - 3.1 x10E3/uL   Monocytes Absolute 0.4 0.1 - 0.9 x10E3/uL   EOS (ABSOLUTE) 0.2 0.0 - 0.4 x10E3/uL   Basophils Absolute 0.0 0.0 - 0.2 x10E3/uL   Immature Granulocytes 0 Not Estab. %   Immature Grans (Abs) 0.0 0.0 - 0.1 A30N4/MH  Follicle stimulating hormone     Status: None   Collection Time: 08/19/21  8:19 AM  Result Value Ref Range   FSH 10.7 mIU/mL    Comment:                     Adult Female:                       Follicular phase      3.5 -  12.5                       Ovulation phase       4.7 -  21.5                       Luteal phase          1.7  -   7.7                       Postmenopausal       25.8 - 134.8   Estradiol     Status: None   Collection Time: 08/19/21  8:19 AM  Result Value Ref Range   Estradiol 62.5 pg/mL    Comment:                     Adult Female:                       Follicular phase   68.0 -   166.0                       Ovulation phase    85.8 -   498.0                       Luteal phase       43.8 -   211.0                       Postmenopausal     <6.0 -    54.7                     Pregnancy  1st trimester     215.0 - >4300.0 Roche ECLIA methodology   Prolactin     Status: None   Collection Time: 08/19/21  8:19 AM  Result Value Ref Range   Prolactin 22.4 4.8 - 23.3 ng/mL  TSH     Status: None   Collection Time: 08/19/21  8:19 AM  Result Value Ref Range   TSH 2.650 0.450 - 4.500 uIU/mL  Anti mullerian hormone     Status: None   Collection Time: 08/19/21  8:19 AM  Result Value Ref Range   ANTI-MULLERIAN HORMONE (AMH) 0.370 ng/mL    Comment: For assays employing antibodies, the possibility exists for interference by heterophile antibodies in the samples.1  1.Kricka L.  Interferences in Immunoassays - still a threat.  Clin. Chem. 2000; 46: 0092-0041.  This test was developed and its performance characteristics determined by [facility]. It has not been cleared or approved by the Food and Drug Administration. Reference Range: Females 41 - 46y: 0.26 - 5.81 Median 0.58 AMH concentrations of >= 1.06 ng/mL is correlated with a better response to ovarian stimulation, produced more retrievable oocytes and higher odds of live birth according to Gleicher et al.  Buelah Manis and Sterility. 2010: 59:3012-3799.  The current AMH test method correlates with the study method with a slope of 0.94. Females at risk of ovarian hyperstimulation syndrome or polycystic ovarian syndrome (PCOS) may exhibit elevated serum AMH concentrations.   AMH levels from PCOS patients may be 2 to 5 fold higher  than age-appropriate reference int erval values. Granulosa cell tumors of the ovary may secrete AMH along with other tumor markers.  Elevated AMH is not specific for malignancy, and the assay should not be used exclusively to diagnose or exclude an AMH-secreting ovarian tumor.

## 2021-09-05 ENCOUNTER — Other Ambulatory Visit: Payer: Self-pay

## 2021-09-05 ENCOUNTER — Other Ambulatory Visit: Payer: BC Managed Care – PPO

## 2021-09-05 DIAGNOSIS — Z3169 Encounter for other general counseling and advice on procreation: Secondary | ICD-10-CM

## 2021-09-05 DIAGNOSIS — E039 Hypothyroidism, unspecified: Secondary | ICD-10-CM

## 2021-09-06 LAB — ESTRADIOL: Estradiol: 174 pg/mL

## 2021-09-06 LAB — PROGESTERONE: Progesterone: 14.5 ng/mL

## 2021-09-06 LAB — TSH: TSH: 2.22 u[IU]/mL (ref 0.450–4.500)

## 2021-09-08 ENCOUNTER — Other Ambulatory Visit: Payer: Self-pay | Admitting: Medical

## 2021-09-08 DIAGNOSIS — E039 Hypothyroidism, unspecified: Secondary | ICD-10-CM

## 2021-09-08 NOTE — Progress Notes (Addendum)
Referred to Endocrinology per patient request. She would like to stop  her hypothyroidism medication and do natural otc medication. She is trying to get pregnant and believes that the thyroid medication is not helping her fertility.   Patient given referral form for external referral to endocrinology. She did receive a call from endocrinology from [facility] but patient does not want to go to that clinic, it is too far for her.

## 2021-09-08 NOTE — Addendum Note (Signed)
Addended by: Nikiya Starn, Nira Conn R on: 09/08/2021 10:20 AM   Modules accepted: Orders

## 2021-09-10 ENCOUNTER — Other Ambulatory Visit: Payer: Self-pay | Admitting: Obstetrics and Gynecology

## 2021-09-10 DIAGNOSIS — Z1231 Encounter for screening mammogram for malignant neoplasm of breast: Secondary | ICD-10-CM

## 2021-09-11 ENCOUNTER — Encounter: Payer: Self-pay | Admitting: Dermatology

## 2021-09-17 ENCOUNTER — Other Ambulatory Visit: Payer: Self-pay | Admitting: Medical

## 2021-09-18 ENCOUNTER — Other Ambulatory Visit: Payer: Self-pay

## 2021-09-18 ENCOUNTER — Ambulatory Visit: Payer: BC Managed Care – PPO | Admitting: Dermatology

## 2021-09-18 DIAGNOSIS — L700 Acne vulgaris: Secondary | ICD-10-CM | POA: Diagnosis not present

## 2021-09-18 DIAGNOSIS — L578 Other skin changes due to chronic exposure to nonionizing radiation: Secondary | ICD-10-CM

## 2021-09-18 DIAGNOSIS — Z1283 Encounter for screening for malignant neoplasm of skin: Secondary | ICD-10-CM | POA: Diagnosis not present

## 2021-09-18 DIAGNOSIS — Z85828 Personal history of other malignant neoplasm of skin: Secondary | ICD-10-CM

## 2021-09-18 DIAGNOSIS — D229 Melanocytic nevi, unspecified: Secondary | ICD-10-CM

## 2021-09-18 DIAGNOSIS — D1800 Hemangioma unspecified site: Secondary | ICD-10-CM

## 2021-09-18 DIAGNOSIS — L2089 Other atopic dermatitis: Secondary | ICD-10-CM

## 2021-09-18 DIAGNOSIS — Z8639 Personal history of other endocrine, nutritional and metabolic disease: Secondary | ICD-10-CM

## 2021-09-18 DIAGNOSIS — L988 Other specified disorders of the skin and subcutaneous tissue: Secondary | ICD-10-CM

## 2021-09-18 DIAGNOSIS — L814 Other melanin hyperpigmentation: Secondary | ICD-10-CM

## 2021-09-18 DIAGNOSIS — L821 Other seborrheic keratosis: Secondary | ICD-10-CM

## 2021-09-18 DIAGNOSIS — Z86018 Personal history of other benign neoplasm: Secondary | ICD-10-CM

## 2021-09-18 MED ORDER — TRIAMCINOLONE ACETONIDE 0.1 % EX CREA: TOPICAL_CREAM | CUTANEOUS | 0 refills | Status: DC

## 2021-09-18 NOTE — Progress Notes (Signed)
Follow-Up Visit   Subjective  Holly Pineda is a 41 y.o. female who presents for the following: Annual Exam (Patient has an irritated skin lesion on the R pretibia that she would like treated today. ) and Facial Elastosis (Patient is here today for Botox injections. She would like the same amount of Botox as previously injected.).   The following portions of the chart were reviewed this encounter and updated as appropriate:   Tobacco   Allergies   Meds   Problems   Med Hx   Surg Hx   Fam Hx       Review of Systems:  No other skin or systemic complaints except as noted in HPI or Assessment and Plan.  Objective  Well appearing patient in no apparent distress; mood and affect are within normal limits.  A full examination was performed including scalp, head, eyes, ears, nose, lips, neck, chest, axillae, abdomen, back, buttocks, bilateral upper extremities, bilateral lower extremities, hands, feet, fingers, toes, fingernails, and toenails. All findings within normal limits unless otherwise noted below.  Face Rare open comedones  Face Rhytides and volume loss.   Trunk, extremities Scaly pink papules coalescing to plaques     Assessment & Plan  Acne vulgaris Face  Chronic condition with duration or expected duration over one year. Currently well-controlled.  Continue Benzaclin gel QD PRN and Tretinoin 0.5% cream QHS. Topical retinoid medications like tretinoin/Retin-A, adapalene/Differin, tazarotene/Fabior, and Epiduo/Epiduo Forte can cause dryness and irritation when first started. Only apply a pea-sized amount to the entire affected area. Avoid applying it around the eyes, edges of mouth and creases at the nose. If you experience irritation, use a good moisturizer first and/or apply the medicine less often. If you are doing well with the medicine, you can increase how often you use it until you are applying every night. Be careful with sun protection while using this  medication as it can make you sensitive to the sun. This medicine should not be used by pregnant women.    Related Medications clindamycin-benzoyl peroxide (BENZACLIN) gel Apply in morning to spot treat as needed.  Elastosis of skin Face  Botox 7 units injected as marked:  - Forehead 5 units - Under eye area 1 unit each for a total of 2 units  Discussed adding Voluma 1 syringe to the B/L mid face to help decrease the appearance of nasolabial folds and rejuvenate mid-face area.   Botox Injection - Face Location: See attached image  Informed consent: Discussed risks (infection, pain, bleeding, bruising, swelling, allergic reaction, paralysis of nearby muscles, eyelid droop, double vision, neck weakness, difficulty breathing, headache, undesirable cosmetic result, and need for additional treatment) and benefits of the procedure, as well as the alternatives.  Informed consent was obtained.  Preparation: The area was cleansed with alcohol.  Procedure Details:  Botox was injected into the dermis with a 30-gauge needle. Pressure applied to any bleeding. Ice packs offered for swelling.  Lot Number:  [id number] Expiration:  10/2023  Total Units Injected:  7  Plan: Tylenol may be used for headache.  Allow 2 weeks before returning to clinic for additional dosing as needed. Patient will call for any problems.   Other atopic dermatitis Trunk, extremities  Chronic and persistent condition with duration or expected duration over one year. Condition is bothersome/symptomatic for patient. Currently flared.  Atopic dermatitis (eczema) is a chronic, relapsing, pruritic condition that can significantly affect quality of life. It is often associated with allergic rhinitis and/or asthma  and can require treatment with topical medications, phototherapy, or in severe cases biologic injectable medication (Dupixent; Adbry) or Oral JAK inhibitors.  Start TMC 0.1% cream to eczema BID up to two weeks.  Topical steroids (such as triamcinolone, fluocinolone, fluocinonide, mometasone, clobetasol, halobetasol, betamethasone, hydrocortisone) can cause thinning and lightening of the skin if they are used for too long in the same area. Your physician has selected the right strength medicine for your problem and area affected on the body. Please use your medication only as directed by your physician to prevent side effects.    triamcinolone cream (KENALOG) 0.1 % - Trunk, extremities Apply to rash BID up to two weeks. Avoid applying to face, groin, and axilla. Use as directed. Long-term use can cause thinning of the skin.  History of vitamin D deficiency Chest - Medial (Center)  Labs in 06/2021 WNL - Recommend decreasing to 2,000 IU a day vitamin D   Lentigines - Scattered tan macules - Due to sun exposure - Benign-appearing, observe - Recommend daily broad spectrum sunscreen SPF 30+ to sun-exposed areas, reapply every 2 hours as needed. - Call for any changes  Seborrheic Keratoses - Stuck-on, waxy, tan-brown papules and/or plaques  - Benign-appearing - Discussed benign etiology and prognosis. - Observe - Call for any changes  Melanocytic Nevi - Tan-brown and/or pink-flesh-colored symmetric macules and papules - Benign appearing on exam today - Observation - Call clinic for new or changing moles - Recommend daily use of broad spectrum spf 30+ sunscreen to sun-exposed areas.   Hemangiomas - Red papules - Discussed benign nature - Observe - Call for any changes  Actinic Damage - Chronic condition, secondary to cumulative UV/sun exposure - diffuse scaly erythematous macules with underlying dyspigmentation - Recommend daily broad spectrum sunscreen SPF 30+ to sun-exposed areas, reapply every 2 hours as needed.  - Staying in the shade or wearing long sleeves, sun glasses (UVA+UVB protection) and wide brim hats (4-inch brim around the entire circumference of the hat) are also  recommended for sun protection.  - Call for new or changing lesions.  History of Basal Cell Carcinoma of the Skin - No evidence of recurrence today - Recommend regular full body skin exams - Recommend daily broad spectrum sunscreen SPF 30+ to sun-exposed areas, reapply every 2 hours as needed.  - Call if any new or changing lesions are noted between office visits  History of Dysplastic Nevi - No evidence of recurrence today - Recommend regular full body skin exams - Recommend daily broad spectrum sunscreen SPF 30+ to sun-exposed areas, reapply every 2 hours as needed.  - Call if any new or changing lesions are noted between office visits  Skin cancer screening performed today.  Return in about 1 year (around 09/18/2022) for TBSE.  Luther Redo, CMA, am acting as scribe for Forest Gleason, MD .  Documentation: I have reviewed the above documentation for accuracy and completeness, and I agree with the above.  Forest Gleason, MD

## 2021-09-18 NOTE — Patient Instructions (Addendum)
Start Triamcinolone or Tacrolimus twice daily up to two weeks. Ok to use Tacrolimus daily. Topical steroids (such as triamcinolone, fluocinolone, fluocinonide, mometasone, clobetasol, halobetasol, betamethasone, hydrocortisone) can cause thinning and lightening of the skin if they are used for too long in the same area. Your physician has selected the right strength medicine for your problem and area affected on the body. Please use your medication only as directed by your physician to prevent side effects.      If You Need Anything After Your Visit  If you have any questions or concerns for your doctor, please call our main line at [phone] and press option 4 to reach your doctor's medical assistant. If no one answers, please leave a voicemail as directed and we will return your call as soon as possible. Messages left after 4 pm will be answered the following business day.   You may also send [place] a message via [place] [employer]. We typically respond to MyChart messages within 1-2 business days.  For prescription refills, please ask your pharmacy to contact our office. Our fax number is [phone].  If you have an urgent issue when the clinic is closed that cannot wait until the next business day, you can page your doctor at the number below.    Please note that while we do our best to be available for urgent issues outside of office hours, we are not available 24/7.   If you have an urgent issue and are unable to reach [place], you may choose to seek medical care at your doctor's office, retail clinic, urgent care center, or emergency room.  If you have a medical emergency, please immediately call 911 or go to the emergency department.  Pager Numbers  - Dr. Nehemiah Massed: [phone]  - Dr. Laurence Ferrari: [phone]  - Dr. Nicole Kindred: [phone]  In the event of inclement weather, please call our main line at [phone] for an update on the status of any delays or closures.  Dermatology Medication  Tips: Please keep the boxes that topical medications come in in order to help keep track of the instructions about where and how to use these. Pharmacies typically print the medication instructions only on the boxes and not directly on the medication tubes.   If your medication is too expensive, please contact our office at [phone] option 4 or send [place] a message through [place].   We are unable to tell what your co-pay for medications will be in advance as this is different depending on your insurance coverage. However, we may be able to find a substitute medication at lower cost or fill out paperwork to get insurance to cover a needed medication.   If a prior authorization is required to get your medication covered by your insurance company, please allow [place] 1-2 business days to complete this process.  Drug prices often vary depending on where the prescription is filled and some pharmacies may offer cheaper prices.  The website www.goodrx.com contains coupons for medications through different pharmacies. The prices here do not account for what the cost may be with help from insurance (it may be cheaper with your insurance), but the website can give you the price if you did not use any insurance.  - You can print the associated coupon and take it with your prescription to the pharmacy.  - You may also stop by our office during regular business hours and pick up a GoodRx coupon card.  - If you need your prescription sent electronically to a different pharmacy,  notify our office through [facility] or by phone at [phone] option 4.     Si Usted Necesita Algo Despus de Su Visita  Tambin puede enviarnos un mensaje a travs de Pharmacist, community. Por lo general respondemos a los mensajes de MyChart en el transcurso de 1 a 2 das hbiles.  Para renovar recetas, por favor pida a su farmacia que se ponga en contacto con nuestra oficina. Harland Dingwall de fax es [place] [phone].  Si tiene un  asunto urgente cuando la clnica est cerrada y que no puede esperar hasta el siguiente da hbil, puede llamar/localizar a su doctor(a) al nmero que aparece a continuacin.   Por favor, tenga en cuenta que aunque hacemos todo lo posible para estar disponibles para asuntos urgentes fuera del horario de [place], no estamos disponibles las 24 horas del da, los 7 das de la [place].   Si tiene un problema urgente y no puede comunicarse con nosotros, puede optar por buscar atencin mdica  en el consultorio de su doctor(a), en una clnica privada, en un centro de atencin urgente o en una sala de emergencias.  Si tiene Engineering geologist, por favor llame inmediatamente al 911 o vaya a la sala de emergencias.  Nmeros de bper  - Dr. Nehemiah Massed: [phone]  - Dra. Moye: [phone]  - Dra. Nicole Kindred: [phone]  En caso de inclemencias del [place], por favor llame a Johnsie Kindred principal al [phone] para una actualizacin sobre el [place] de cualquier retraso o cierre.  Consejos para la medicacin en dermatologa: Por favor, guarde las cajas en las que vienen los medicamentos de uso tpico para ayudarle a seguir las instrucciones sobre dnde y cmo usarlos. Las farmacias generalmente imprimen las instrucciones del medicamento slo en las cajas y no directamente en los tubos del [place].   Si su medicamento es muy caro, por favor, pngase en contacto con Zigmund Daniel llamando al [phone] y presione la opcin 4 o envenos un mensaje a travs de Pharmacist, community.   No podemos decirle cul ser su copago por los medicamentos por adelantado ya que esto es diferente dependiendo de la cobertura de su seguro. Sin embargo, es posible que podamos encontrar un medicamento sustituto a Electrical engineer un formulario para que el seguro cubra el medicamento que se considera necesario.   Si se requiere una autorizacin previa para que su compaa de seguros [place] su medicamento, por favor  permtanos de 1 a 2 das hbiles para completar este proceso.  Los precios de los medicamentos varan con frecuencia dependiendo del Environmental consultant de dnde se surte la receta y alguna farmacias pueden ofrecer precios ms baratos.  El sitio web www.goodrx.com tiene cupones para medicamentos de Airline pilot. Los precios aqu no tienen en cuenta lo que podra costar con la ayuda del seguro (puede ser ms barato con su seguro), pero el sitio web puede darle el precio si no utiliz Research scientist (physical sciences).  - Puede imprimir el cupn correspondiente y llevarlo con su receta a la farmacia.  - Tambin puede pasar por nuestra oficina durante el horario de atencin regular y Charity fundraiser una tarjeta de cupones de GoodRx.  - Si necesita que su receta se enve electrnicamente a una farmacia diferente, informe a nuestra oficina a travs de MyChart de [place] o por telfono llamando al [phone] y presione la opcin 4.

## 2021-09-23 NOTE — Addendum Note (Signed)
Addended by: Ruthell Rummage A on: 09/23/2021 09:44 AM   Modules accepted: Orders

## 2021-09-24 ENCOUNTER — Encounter: Payer: Self-pay | Admitting: Medical

## 2021-09-29 ENCOUNTER — Encounter: Payer: Self-pay | Admitting: Dermatology

## 2021-10-15 ENCOUNTER — Other Ambulatory Visit: Payer: Self-pay

## 2021-10-15 ENCOUNTER — Ambulatory Visit
Admission: RE | Admit: 2021-10-15 | Discharge: 2021-10-15 | Disposition: A | Discharge status: Home / Self Care | Payer: BC Managed Care – PPO | Source: Ambulatory Visit | Attending: Obstetrics and Gynecology | Admitting: Obstetrics and Gynecology

## 2021-10-15 DIAGNOSIS — Z1231 Encounter for screening mammogram for malignant neoplasm of breast: Secondary | ICD-10-CM | POA: Insufficient documentation

## 2021-10-23 ENCOUNTER — Encounter: Payer: BC Managed Care – PPO | Admitting: Dermatology

## 2021-10-29 DIAGNOSIS — R87612 Low grade squamous intraepithelial lesion on cytologic smear of cervix (LGSIL): Secondary | ICD-10-CM | POA: Diagnosis not present

## 2021-10-29 DIAGNOSIS — R87618 Other abnormal cytological findings on specimens from cervix uteri: Secondary | ICD-10-CM | POA: Diagnosis not present

## 2021-10-29 DIAGNOSIS — A630 Anogenital (venereal) warts: Secondary | ICD-10-CM | POA: Diagnosis not present

## 2021-10-29 DIAGNOSIS — Z3169 Encounter for other general counseling and advice on procreation: Secondary | ICD-10-CM | POA: Diagnosis not present

## 2021-10-29 DIAGNOSIS — N890 Mild vaginal dysplasia: Secondary | ICD-10-CM | POA: Diagnosis not present

## 2021-10-29 DIAGNOSIS — Z3202 Encounter for pregnancy test, result negative: Secondary | ICD-10-CM | POA: Diagnosis not present

## 2021-10-29 DIAGNOSIS — N870 Mild cervical dysplasia: Secondary | ICD-10-CM | POA: Diagnosis not present

## 2021-11-05 ENCOUNTER — Ambulatory Visit: Payer: BC Managed Care – PPO | Admitting: Medical

## 2021-11-05 ENCOUNTER — Encounter: Payer: Self-pay | Admitting: Medical

## 2021-11-05 VITALS — BP 110/78 | HR 90 | Temp 98.8°F | Resp 16

## 2021-11-05 DIAGNOSIS — E559 Vitamin D deficiency, unspecified: Secondary | ICD-10-CM

## 2021-11-05 DIAGNOSIS — Z0000 Encounter for general adult medical examination without abnormal findings: Secondary | ICD-10-CM

## 2021-11-05 DIAGNOSIS — F439 Reaction to severe stress, unspecified: Secondary | ICD-10-CM

## 2021-11-05 DIAGNOSIS — G479 Sleep disorder, unspecified: Secondary | ICD-10-CM

## 2021-11-05 DIAGNOSIS — R5383 Other fatigue: Secondary | ICD-10-CM

## 2021-11-05 NOTE — Progress Notes (Signed)
? ?  Subjective:  ? ? Patient ID: Holly Pineda, female    DOB: 30-Sep-1980, 41 y.o.   MRN: [id number] ? ?HPI ? ?41 yo female in non acute distress. ?Feeling fatigued, having trouble sleeping, under stress.( Trouble with her  72 yo son). ? ?Took Seroquel for sleep and did not like how it made her feel as fas as her memory. Has tried Restyl and Trazodone both with side effects. ? ?Has appointment with Fertility specialist  on November 27, 2021. ? ?Review of Systems  ?Constitutional:  Positive for fatigue.  ?Psychiatric/Behavioral:  Positive for sleep disturbance.   ?     Under stress and worrying a lot.  ? Boyfriends dog is sometimes sleeping with them in bed and the dog wakes her up at night. ?   ?Objective:  ? Physical Exam ?Constitutional:   ?   Appearance: Normal appearance. She is normal weight.  ?HENT:  ?   Head: Normocephalic and atraumatic.  ?Eyes:  ?   Extraocular Movements: Extraocular movements intact.  ?   Conjunctiva/sclera: Conjunctivae normal.  ?   Pupils: Pupils are equal, round, and reactive to light.  ?Pulmonary:  ?   Effort: Pulmonary effort is normal.  ?Musculoskeletal:     ?   General: Normal range of motion.  ?   Cervical back: Normal range of motion.  ?Skin: ?   General: Skin is warm and dry.  ?Neurological:  ?   General: No focal deficit present.  ?   Mental Status: She is alert and oriented to person, place, and time. Mental status is at baseline.  ?Psychiatric:     ?   Mood and Affect: Mood normal.     ?   Behavior: Behavior normal.     ?   Thought Content: Thought content normal.     ?   Judgment: Judgment normal.  ? ? ? ? ? ?   ?Assessment & Plan:  ?Stress ?Fatigue ?Trouble sleeping ?Will check  CBC, B12 and  Folate and Vit D levels. ?Orders Placed This Encounter  ?Procedures  ? B12 and Folate Panel  ? CBC with Differential/Platelet  ? VITAMIN D 25 Hydroxy (Vit-D Deficiency, Fractures)  ?  ?Recommend she follow up with her psychiatrist for sleeping.she states she does not want to be on  any medication. Recommended counseling given pamphlet.  ?She verbalizies understanding and has no questions at discharge. ? ? ?

## 2021-11-06 LAB — CBC WITH DIFFERENTIAL/PLATELET
Basophils Absolute: 0 10*3/uL (ref 0.0–0.2)
Basos: 1 %
EOS (ABSOLUTE): 0.1 10*3/uL (ref 0.0–0.4)
Eos: 2 %
Hematocrit: 38.8 % (ref 34.0–46.6)
Hemoglobin: 13.1 g/dL (ref 11.1–15.9)
Immature Grans (Abs): 0 10*3/uL (ref 0.0–0.1)
Immature Granulocytes: 0 %
Lymphocytes Absolute: 1.7 10*3/uL (ref 0.7–3.1)
Lymphs: 25 %
MCH: 31.5 pg (ref 26.6–33.0)
MCHC: 33.8 g/dL (ref 31.5–35.7)
MCV: 93 fL (ref 79–97)
Monocytes Absolute: 0.5 10*3/uL (ref 0.1–0.9)
Monocytes: 7 %
Neutrophils Absolute: 4.6 10*3/uL (ref 1.4–7.0)
Neutrophils: 65 %
Platelets: 231 10*3/uL (ref 150–450)
RBC: 4.16 x10E6/uL (ref 3.77–5.28)
RDW: 11.5 % — ABNORMAL LOW (ref 11.7–15.4)
WBC: 6.9 10*3/uL (ref 3.4–10.8)

## 2021-11-06 LAB — VITAMIN D 25 HYDROXY (VIT D DEFICIENCY, FRACTURES): Vit D, 25-Hydroxy: 47.8 ng/mL (ref 30.0–100.0)

## 2021-11-06 LAB — B12 AND FOLATE PANEL
Folate: >20 ng/mL (ref >3.0)
Vitamin B-12: 565 pg/mL (ref 232–1245)

## 2021-11-12 ENCOUNTER — Encounter: Payer: Self-pay | Admitting: Dermatology

## 2021-11-12 ENCOUNTER — Encounter: Payer: Self-pay | Admitting: Medical

## 2021-11-13 MED ORDER — TRETINOIN 0.05 % EX CREA: TOPICAL_CREAM | Freq: Every day | CUTANEOUS | 0 refills | Status: DC

## 2021-11-27 DIAGNOSIS — N979 Female infertility, unspecified: Secondary | ICD-10-CM | POA: Diagnosis not present

## 2021-11-27 DIAGNOSIS — A630 Anogenital (venereal) warts: Secondary | ICD-10-CM | POA: Diagnosis not present

## 2021-11-27 DIAGNOSIS — Z319 Encounter for procreative management, unspecified: Secondary | ICD-10-CM | POA: Diagnosis not present

## 2021-11-29 ENCOUNTER — Other Ambulatory Visit: Payer: Self-pay | Admitting: Medical

## 2021-11-29 DIAGNOSIS — E063 Autoimmune thyroiditis: Secondary | ICD-10-CM

## 2021-12-03 ENCOUNTER — Telehealth: Payer: BC Managed Care – PPO | Admitting: Medical

## 2021-12-03 ENCOUNTER — Encounter: Payer: Self-pay | Admitting: Medical

## 2021-12-09 ENCOUNTER — Other Ambulatory Visit: Payer: Self-pay | Admitting: Medical

## 2021-12-09 DIAGNOSIS — E039 Hypothyroidism, unspecified: Secondary | ICD-10-CM

## 2021-12-09 NOTE — Progress Notes (Signed)
Per patient request , due to availability, located in the   ? ?[facility] group., to see Anders Simmonds. ?

## 2021-12-17 ENCOUNTER — Ambulatory Visit: Payer: BC Managed Care – PPO | Admitting: Medical

## 2021-12-17 ENCOUNTER — Encounter: Payer: Self-pay | Admitting: Medical

## 2021-12-17 VITALS — BP 110/70 | HR 97 | Temp 98.5°F | Resp 16

## 2021-12-17 DIAGNOSIS — Z113 Encounter for screening for infections with a predominantly sexual mode of transmission: Secondary | ICD-10-CM

## 2021-12-17 DIAGNOSIS — M25561 Pain in right knee: Secondary | ICD-10-CM

## 2021-12-17 DIAGNOSIS — R109 Unspecified abdominal pain: Secondary | ICD-10-CM

## 2021-12-17 LAB — POCT URINALYSIS DIPSTICK
Bilirubin, UA: NEGATIVE
Glucose, UA: NEGATIVE
Ketones, UA: NEGATIVE
Nitrite, UA: NEGATIVE
Protein, UA: NEGATIVE
Spec Grav, UA: 1.01 (ref 1.010–1.025)
Urobilinogen, UA: 0.2 E.U./dL
pH, UA: 6 (ref 5.0–8.0)

## 2021-12-17 NOTE — Progress Notes (Signed)
?  41 yo female in non acute, presents with right leg pain, and left sided pain.Concerned she might have a UTI, because urine is bright yellow. She does take daily high power vitamins.No other symptoms. ? ?Results for orders placed or performed in visit on 12/17/21 (from the past 24 hour(s))  ?POCT urinalysis dipstick     Status: Abnormal  ? Collection Time: 12/17/21  9:12 AM  ?Result Value Ref Range  ? Color, UA yellow   ? Clarity, UA clear   ? Glucose, UA Negative Negative  ? Bilirubin, UA negative   ? Ketones, UA negative   ? Spec Grav, UA 1.010 1.010 - 1.025  ? Blood, UA Trace   ? pH, UA 6.0 5.0 - 8.0  ? Protein, UA Negative Negative  ? Urobilinogen, UA 0.2 0.2 or 1.0 E.U./dL  ? Nitrite, UA Negative   ? Leukocytes, UA Trace (A) Negative  ? Appearance    ? Odor    ?Urine is clear.She just ended period yesterday. ? ?Knee is cracking some has FROM x 2-3 days. ? ?Would also like HIV test to ensure she is negative. ?6 month check. ? ?Patient declines steroids or muscle relaxers at this time. ? ?

## 2021-12-18 ENCOUNTER — Ambulatory Visit: Payer: BC Managed Care – PPO | Admitting: Nurse Practitioner

## 2021-12-18 ENCOUNTER — Telehealth: Payer: Self-pay | Admitting: Nurse Practitioner

## 2021-12-18 DIAGNOSIS — Z09 Encounter for follow-up examination after completed treatment for conditions other than malignant neoplasm: Secondary | ICD-10-CM

## 2021-12-18 LAB — HIV ANTIBODY (ROUTINE TESTING W REFLEX): HIV Screen 4th Generation wRfx: NONREACTIVE

## 2021-12-18 NOTE — Progress Notes (Signed)
Telephone encounter documented-

## 2021-12-18 NOTE — Telephone Encounter (Signed)
Had urine testing yesterday. Looking for clarity on trace leukocytes noted on urine dip. Reassured that this was not enough evidence for UTI. Follow up as needed

## 2021-12-31 DIAGNOSIS — A630 Anogenital (venereal) warts: Secondary | ICD-10-CM | POA: Diagnosis not present

## 2021-12-31 DIAGNOSIS — R87810 Cervical high risk human papillomavirus (HPV) DNA test positive: Secondary | ICD-10-CM | POA: Diagnosis not present

## 2022-01-13 DIAGNOSIS — R87810 Cervical high risk human papillomavirus (HPV) DNA test positive: Secondary | ICD-10-CM | POA: Diagnosis not present

## 2022-01-13 DIAGNOSIS — A630 Anogenital (venereal) warts: Secondary | ICD-10-CM | POA: Diagnosis not present

## 2022-01-20 DIAGNOSIS — A630 Anogenital (venereal) warts: Secondary | ICD-10-CM | POA: Diagnosis not present

## 2022-01-20 DIAGNOSIS — R87810 Cervical high risk human papillomavirus (HPV) DNA test positive: Secondary | ICD-10-CM | POA: Diagnosis not present

## 2022-01-22 DIAGNOSIS — N979 Female infertility, unspecified: Secondary | ICD-10-CM | POA: Diagnosis not present

## 2022-01-22 DIAGNOSIS — Z3183 Encounter for assisted reproductive fertility procedure cycle: Secondary | ICD-10-CM | POA: Diagnosis not present

## 2022-01-22 DIAGNOSIS — Z319 Encounter for procreative management, unspecified: Secondary | ICD-10-CM | POA: Diagnosis not present

## 2022-01-29 ENCOUNTER — Ambulatory Visit (INDEPENDENT_AMBULATORY_CARE_PROVIDER_SITE_OTHER): Payer: Self-pay | Admitting: Dermatology

## 2022-01-29 DIAGNOSIS — Z6821 Body mass index (BMI) 21.0-21.9, adult: Secondary | ICD-10-CM | POA: Diagnosis not present

## 2022-01-29 DIAGNOSIS — L988 Other specified disorders of the skin and subcutaneous tissue: Secondary | ICD-10-CM

## 2022-01-29 DIAGNOSIS — E039 Hypothyroidism, unspecified: Secondary | ICD-10-CM | POA: Diagnosis not present

## 2022-01-29 DIAGNOSIS — E049 Nontoxic goiter, unspecified: Secondary | ICD-10-CM | POA: Diagnosis not present

## 2022-01-29 NOTE — Progress Notes (Signed)
   Follow-Up Visit   Subjective  Holly Pineda is a 41 y.o. female who presents for the following: Facial Elastosis (Patient here today for botox follow up. ).  The following portions of the chart were reviewed this encounter and updated as appropriate:  Tobacco  Allergies  Meds  Problems  Med Hx  Surg Hx  Fam Hx      Review of Systems: No other skin or systemic complaints except as noted in HPI or Assessment and Plan.   Objective  Well appearing patient in no apparent distress; mood and affect are within normal limits. A focused examination was performed including face. Relevant physical exam findings are noted in the Assessment and Plan.   face Rhytides and volume loss.       Assessment & Plan  Elastosis of skin face  Botox 7 units injected as marked:  - Forehead 5 units - Under eye area 1 unit each for a total of 2 units  Discussed 1.0 ml Voluma at mid-face at next filler appointment  $700   Filling material injection - face Location: See attached image  Informed consent: Discussed risks (infection, pain, bleeding, bruising, swelling, allergic reaction, paralysis of nearby muscles, eyelid droop, double vision, neck weakness, difficulty breathing, headache, undesirable cosmetic result, and need for additional treatment) and benefits of the procedure, as well as the alternatives.  Informed consent was obtained.  Preparation: The area was cleansed with alcohol.  Procedure Details:  Botox was injected into the dermis with a 30-gauge needle. Pressure applied to any bleeding. Ice packs offered for swelling.  Lot Number:  [id number] Expiration:  12/2023  Total Units Injected:  7  Plan: Tylenol may be used for headache.  Allow 2 weeks before returning to clinic for additional dosing as needed. Patient will call for any problems.    Return for keep follow up as scheduled 02/25/2022.  I, Ruthell Rummage, CMA, am acting as scribe for Forest Gleason,  MD.  Documentation: I have reviewed the above documentation for accuracy and completeness, and I agree with the above.  Forest Gleason, MD

## 2022-01-29 NOTE — Patient Instructions (Addendum)
Recommend minoxidil 5% (Rogaine for men) solution or foam to be applied to the scalp and left in. This should ideally be used twice daily for best results but it helps with hair regrowth when used at least three times per week. Rogaine initially can cause increased hair shedding for the first few weeks but this will stop with continued use. In studies, people who used minoxidil (Rogaine) for at least 6 months had thicker hair than people who did not. Minoxidil topical (Rogaine) only works as long as it continues to be used. If if it is no longer used then the hair it has been helping to regrow can fall out. Minoxidil topical (Rogaine) can cause increased facial hair growth which can usually be managed easily with a battery-operated hair trimmer. If facial hair growth is bothersome, switching to the 2% women's version can decrease the risk of unwanted facial hair growth.  Discussed [place] -    Due to recent changes in healthcare laws, you may see results of your pathology and/or laboratory studies on MyChart before the doctors have had a chance to review them. We understand that in some cases there may be results that are confusing or concerning to you. Please understand that not all results are received at the same time and often the doctors may need to interpret multiple results in order to provide you with the best plan of care or course of treatment. Therefore, we ask that you please give [place] 2 business days to thoroughly review all your results before contacting the office for clarification. Should we see a critical lab result, you will be contacted sooner.   If You Need Anything After Your Visit  If you have any questions or concerns for your doctor, please call our main line at [phone] and press option 4 to reach your doctor's medical assistant. If no one answers, please leave a voicemail as directed and we will return your call as soon as possible. Messages left after 4 pm will be answered  the following business day.   You may also send [place] a message via [place]. We typically respond to MyChart messages within 1-2 business days.  For prescription refills, please ask your pharmacy to contact our office. Our fax number is [phone].  If you have an urgent issue when the clinic is closed that cannot wait until the next business day, you can page your doctor at the number below.    Please note that while we do our best to be available for urgent issues outside of office hours, we are not available 24/7.   If you have an urgent issue and are unable to reach [place], you may choose to seek medical care at your doctor's office, retail clinic, urgent care center, or emergency room.  If you have a medical emergency, please immediately call 911 or go to the emergency department.  Pager Numbers  - Dr. Nehemiah Massed: [phone]  - Dr. Laurence Ferrari: [phone]  - Dr. Nicole Kindred: [phone]  In the event of inclement weather, please call our main line at [phone] for an update on the status of any delays or closures.  Dermatology Medication Tips: Please keep the boxes that topical medications come in in order to help keep track of the instructions about where and how to use these. Pharmacies typically print the medication instructions only on the boxes and not directly on the medication tubes.   If your medication is too expensive, please contact our office at [phone] option 4 or  send [place] a message through [place].   We are unable to tell what your co-pay for medications will be in advance as this is different depending on your insurance coverage. However, we may be able to find a substitute medication at lower cost or fill out paperwork to get insurance to cover a needed medication.   If a prior authorization is required to get your medication covered by your insurance company, please allow [place] 1-2 business days to complete this process.  Drug prices often vary depending on where the  prescription is filled and some pharmacies may offer cheaper prices.  The website www.goodrx.com contains coupons for medications through different pharmacies. The prices here do not account for what the cost may be with help from insurance (it may be cheaper with your insurance), but the website can give you the price if you did not use any insurance.  - You can print the associated coupon and take it with your prescription to the pharmacy.  - You may also stop by our office during regular business hours and pick up a GoodRx coupon card.  - If you need your prescription sent electronically to a different pharmacy, notify our office through [facility] or by phone at [phone] option 4.     Si Usted Necesita Algo Despus de Su Visita  Tambin puede enviarnos un mensaje a travs de Pharmacist, community. Por lo general respondemos a los mensajes de MyChart en el transcurso de 1 a 2 das hbiles.  Para renovar recetas, por favor pida a su farmacia que se ponga en contacto con nuestra oficina. Harland Dingwall de fax es [place] [phone].  Si tiene un asunto urgente cuando la clnica est cerrada y que no puede esperar hasta el siguiente da hbil, puede llamar/localizar a su doctor(a) al nmero que aparece a continuacin.   Por favor, tenga en cuenta que aunque hacemos todo lo posible para estar disponibles para asuntos urgentes fuera del horario de [place], no estamos disponibles las 24 horas del da, los 7 das de la [place].   Si tiene un problema urgente y no puede comunicarse con nosotros, puede optar por buscar atencin mdica  en el consultorio de su doctor(a), en una clnica privada, en un centro de atencin urgente o en una sala de emergencias.  Si tiene Engineering geologist, por favor llame inmediatamente al 911 o vaya a la sala de emergencias.  Nmeros de bper  - Dr. Nehemiah Massed: [phone]  - Dra. Moye: [phone]  - Dra. Nicole Kindred: [phone]  En caso de inclemencias del [place],  por favor llame a Johnsie Kindred principal al [phone] para una actualizacin sobre el [place] de cualquier retraso o cierre.  Consejos para la medicacin en dermatologa: Por favor, guarde las cajas en las que vienen los medicamentos de uso tpico para ayudarle a seguir las instrucciones sobre dnde y cmo usarlos. Las farmacias generalmente imprimen las instrucciones del medicamento slo en las cajas y no directamente en los tubos del [place].   Si su medicamento es muy caro, por favor, pngase en contacto con Zigmund Daniel llamando al [phone] y presione la opcin 4 o envenos un mensaje a travs de Pharmacist, community.   No podemos decirle cul ser su copago por los medicamentos por adelantado ya que esto es diferente dependiendo de la cobertura de su seguro. Sin embargo, es posible que podamos encontrar un medicamento sustituto a Electrical engineer un formulario para que el seguro cubra el medicamento que se considera necesario.   Si  se requiere una autorizacin previa para que su compaa de seguros [place] su medicamento, por favor permtanos de 1 a 2 das hbiles para completar este proceso.  Los precios de los medicamentos varan con frecuencia dependiendo del Environmental consultant de dnde se surte la receta y alguna farmacias pueden ofrecer precios ms baratos.  El sitio web www.goodrx.com tiene cupones para medicamentos de Airline pilot. Los precios aqu no tienen en cuenta lo que podra costar con la ayuda del seguro (puede ser ms barato con su seguro), pero el sitio web puede darle el precio si no utiliz Research scientist (physical sciences).  - Puede imprimir el cupn correspondiente y llevarlo con su receta a la farmacia.  - Tambin puede pasar por nuestra oficina durante el horario de atencin regular y Charity fundraiser una tarjeta de cupones de GoodRx.  - Si necesita que su receta se enve electrnicamente a una farmacia diferente, informe a nuestra oficina a travs de MyChart de [place] o por telfono llamando al  [phone] y presione la opcin 4.

## 2022-01-30 DIAGNOSIS — E039 Hypothyroidism, unspecified: Secondary | ICD-10-CM | POA: Insufficient documentation

## 2022-02-03 ENCOUNTER — Encounter: Payer: Self-pay | Admitting: Dermatology

## 2022-02-06 DIAGNOSIS — E039 Hypothyroidism, unspecified: Secondary | ICD-10-CM | POA: Diagnosis not present

## 2022-02-11 ENCOUNTER — Encounter: Payer: Self-pay | Admitting: Medical

## 2022-02-12 DIAGNOSIS — N979 Female infertility, unspecified: Secondary | ICD-10-CM | POA: Diagnosis not present

## 2022-02-12 DIAGNOSIS — Z3141 Encounter for fertility testing: Secondary | ICD-10-CM | POA: Diagnosis not present

## 2022-02-17 DIAGNOSIS — N979 Female infertility, unspecified: Secondary | ICD-10-CM | POA: Diagnosis not present

## 2022-02-17 DIAGNOSIS — Z3141 Encounter for fertility testing: Secondary | ICD-10-CM | POA: Diagnosis not present

## 2022-02-18 ENCOUNTER — Ambulatory Visit: Payer: BC Managed Care – PPO | Admitting: Medical

## 2022-02-18 DIAGNOSIS — A6000 Herpesviral infection of urogenital system, unspecified: Secondary | ICD-10-CM

## 2022-02-18 MED ORDER — VALACYCLOVIR HCL 1 G PO TABS: 1000.0000 mg | ORAL_TABLET | Freq: Two times a day (BID) | ORAL | 0 refills | Status: AC

## 2022-02-19 ENCOUNTER — Encounter: Payer: Self-pay | Admitting: Medical

## 2022-02-19 DIAGNOSIS — E049 Nontoxic goiter, unspecified: Secondary | ICD-10-CM | POA: Diagnosis not present

## 2022-02-19 DIAGNOSIS — E041 Nontoxic single thyroid nodule: Secondary | ICD-10-CM | POA: Insufficient documentation

## 2022-02-19 DIAGNOSIS — E0789 Other specified disorders of thyroid: Secondary | ICD-10-CM | POA: Insufficient documentation

## 2022-02-19 DIAGNOSIS — E039 Hypothyroidism, unspecified: Secondary | ICD-10-CM | POA: Diagnosis not present

## 2022-02-19 DIAGNOSIS — E063 Autoimmune thyroiditis: Secondary | ICD-10-CM | POA: Diagnosis not present

## 2022-02-19 DIAGNOSIS — F419 Anxiety disorder, unspecified: Secondary | ICD-10-CM | POA: Diagnosis not present

## 2022-02-19 HISTORY — DX: Nontoxic goiter, unspecified: E04.9

## 2022-02-25 ENCOUNTER — Encounter: Payer: Self-pay | Admitting: Medical

## 2022-02-25 ENCOUNTER — Ambulatory Visit: Payer: BC Managed Care – PPO | Admitting: Medical

## 2022-02-25 ENCOUNTER — Ambulatory Visit (INDEPENDENT_AMBULATORY_CARE_PROVIDER_SITE_OTHER): Payer: Self-pay | Admitting: Dermatology

## 2022-02-25 VITALS — Temp 98.9°F

## 2022-02-25 DIAGNOSIS — Z79899 Other long term (current) drug therapy: Secondary | ICD-10-CM

## 2022-02-25 DIAGNOSIS — L988 Other specified disorders of the skin and subcutaneous tissue: Secondary | ICD-10-CM

## 2022-02-25 NOTE — Patient Instructions (Signed)
Due to recent changes in healthcare laws, you may see results of your pathology and/or laboratory studies on MyChart before the doctors have had a chance to review them. We understand that in some cases there may be results that are confusing or concerning to you. Please understand that not all results are received at the same time and often the doctors may need to interpret multiple results in order to provide you with the best plan of care or course of treatment. Therefore, we ask that you please give us 2 business days to thoroughly review all your results before contacting the office for clarification. Should we see a critical lab result, you will be contacted sooner.   If You Need Anything After Your Visit  If you have any questions or concerns for your doctor, please call our main line at [phone] and press option 4 to reach your doctor's medical assistant. If no one answers, please leave a voicemail as directed and we will return your call as soon as possible. Messages left after 4 pm will be answered the following business day.   You may also send us a message via MyChart. We typically respond to MyChart messages within 1-2 business days.  For prescription refills, please ask your pharmacy to contact our office. Our fax number is [phone].  If you have an urgent issue when the clinic is closed that cannot wait until the next business day, you can page your doctor at the number below.    Please note that while we do our best to be available for urgent issues outside of office hours, we are not available 24/7.   If you have an urgent issue and are unable to reach us, you may choose to seek medical care at your doctor's office, retail clinic, urgent care center, or emergency room.  If you have a medical emergency, please immediately call 911 or go to the emergency department.  Pager Numbers  - Dr. Kowalski: [phone]  - Dr. Moye: [phone]  - Dr. Stewart:  [phone]  In the event of inclement weather, please call our main line at [phone] for an update on the status of any delays or closures.  Dermatology Medication Tips: Please keep the boxes that topical medications come in in order to help keep track of the instructions about where and how to use these. Pharmacies typically print the medication instructions only on the boxes and not directly on the medication tubes.   If your medication is too expensive, please contact our office at [phone] option 4 or send us a message through MyChart.   We are unable to tell what your co-pay for medications will be in advance as this is different depending on your insurance coverage. However, we may be able to find a substitute medication at lower cost or fill out paperwork to get insurance to cover a needed medication.   If a prior authorization is required to get your medication covered by your insurance company, please allow us 1-2 business days to complete this process.  Drug prices often vary depending on where the prescription is filled and some pharmacies may offer cheaper prices.  The website www.goodrx.com contains coupons for medications through different pharmacies. The prices here do not account for what the cost may be with help from insurance (it may be cheaper with your insurance), but the website can give you the price if you did not use any insurance.  - You can print the associated coupon and take it with   your prescription to the pharmacy.  - You may also stop by our office during regular business hours and pick up a GoodRx coupon card.  - If you need your prescription sent electronically to a different pharmacy, notify our office through [place] MyChart or by phone at [phone] option 4.     Si Usted Necesita Algo Despus de Su Visita  Tambin puede enviarnos un mensaje a travs de MyChart. Por lo general respondemos a los mensajes de MyChart en el transcurso de 1 a 2  das hbiles.  Para renovar recetas, por favor pida a su farmacia que se ponga en contacto con nuestra oficina. Nuestro nmero de fax es el [phone].  Si tiene un asunto urgente cuando la clnica est cerrada y que no puede esperar hasta el siguiente da hbil, puede llamar/localizar a su doctor(a) al nmero que aparece a continuacin.   Por favor, tenga en cuenta que aunque hacemos todo lo posible para estar disponibles para asuntos urgentes fuera del horario de oficina, no estamos disponibles las 24 horas del da, los 7 das de la semana.   Si tiene un problema urgente y no puede comunicarse con nosotros, puede optar por buscar atencin mdica  en el consultorio de su doctor(a), en una clnica privada, en un centro de atencin urgente o en una sala de emergencias.  Si tiene una emergencia mdica, por favor llame inmediatamente al 911 o vaya a la sala de emergencias.  Nmeros de bper  - Dr. Kowalski: [phone]  - Dra. Moye: [phone]  - Dra. Stewart: [phone]  En caso de inclemencias del tiempo, por favor llame a nuestra lnea principal al [phone] para una actualizacin sobre el estado de cualquier retraso o cierre.  Consejos para la medicacin en dermatologa: Por favor, guarde las cajas en las que vienen los medicamentos de uso tpico para ayudarle a seguir las instrucciones sobre dnde y cmo usarlos. Las farmacias generalmente imprimen las instrucciones del medicamento slo en las cajas y no directamente en los tubos del medicamento.   Si su medicamento es muy caro, por favor, pngase en contacto con nuestra oficina llamando al [phone] y presione la opcin 4 o envenos un mensaje a travs de MyChart.   No podemos decirle cul ser su copago por los medicamentos por adelantado ya que esto es diferente dependiendo de la cobertura de su seguro. Sin embargo, es posible que podamos encontrar un medicamento sustituto a menor costo o llenar un formulario para que el  seguro cubra el medicamento que se considera necesario.   Si se requiere una autorizacin previa para que su compaa de seguros cubra su medicamento, por favor permtanos de 1 a 2 das hbiles para completar este proceso.  Los precios de los medicamentos varan con frecuencia dependiendo del lugar de dnde se surte la receta y alguna farmacias pueden ofrecer precios ms baratos.  El sitio web www.goodrx.com tiene cupones para medicamentos de diferentes farmacias. Los precios aqu no tienen en cuenta lo que podra costar con la ayuda del seguro (puede ser ms barato con su seguro), pero el sitio web puede darle el precio si no utiliz ningn seguro.  - Puede imprimir el cupn correspondiente y llevarlo con su receta a la farmacia.  - Tambin puede pasar por nuestra oficina durante el horario de atencin regular y recoger una tarjeta de cupones de GoodRx.  - Si necesita que su receta se enve electrnicamente a una farmacia diferente, informe a nuestra oficina a travs de MyChart de [place]   o por telfono llamando al [phone] y presione la opcin 4.  

## 2022-02-25 NOTE — Patient Instructions (Signed)
Celiac Disease Antibodies Test Why am I having this test? The celiac disease antibodies test is a blood test that helps to diagnose celiac disease. It is usually the first step in diagnosing a person with celiac disease. People who have celiac disease cannot tolerate gluten and gliadin, which are proteins. These proteins are found in wheat and wheat products. You may have the test if you have symptoms of celiac disease. Some of these symptoms include: Long-term (chronic) diarrhea. Abdominal pain. Weight loss. You may also have this test if you: Have increased risk for celiac disease. This could be because a close family member has the disease. Are newly diagnosed with Type 1 diabetes or any other autoimmune disorder. What is being tested? This test examines your blood for the presence of specific antibodies that are common to celiac disease. Antibodies are proteins that your body normally makes to protect itself from germs that can make you sick. In people who have celiac disease, the body produces antibodies in response to gluten and gliadin. What kind of sample is taken?  A blood sample is required for this test. It is usually collected by inserting a needle into a blood vessel. How do I prepare for this test? You may be asked to provide a list of foods that you have eaten in the 48 hours before the test. For the test to be accurate, you will need to eat a gluten-containing diet before having the test. The test will show a strong antibody response if you do have celiac disease. Check with your health care provider for specific instructions. How are the results reported? Some of your test results will be reported as values in EU (European units of measurement). Your health care provider will compare your results to normal ranges that were established after testing a large group of people (reference ranges). Reference ranges may vary among labs and hospitals. For this test, common reference ranges  for the three common celiac disease antibodies are: Gliadin IgA and IgG: Birth to 43 years of age: less than 90 EU. 94 years of age and older: less than 25 EU. Tissue transglutaminase IgA, all ages: less than 20 EU. Other results will be reported as positive or negative. For this test, normal results are: Negative for endomysial IgA, all ages. What do the results mean? The following may mean that you have celiac disease: Higher levels of antibodies than the reference range for gliadin IgA and IgG, or tissue transglutaminase IgA. A positive result for endomysial IgA antibodies. High levels of gliadin antibodies may also be caused by Crohn's disease, colitis, and severe lactose intolerance. Talk with your health care provider about what your results mean. In some cases, your health care provider may do more testing to confirm the results. Questions to ask your health care provider Ask your health care provider, or the department that is doing the test: When will my results be ready? How will I get my results? What are my treatment options? What other tests do I need? What are my next steps? Summary The celiac disease antibodies test is a blood test that is done to help diagnose celiac disease. This test examines your blood for the presence of specific antibodies that are common in people who have celiac disease. The presence of certain antibodies, or antibody levels that are above the normal range, may indicate that you have celiac disease. Talk with your health care provider about what your results mean. This information is not intended to replace advice  given to you by your health care provider. Make sure you discuss any questions you have with your health care provider. Document Revised: 06/10/2021 Document Reviewed: 06/10/2021 Elsevier Patient Education  [place]. Celiac Disease  Celiac disease is a condition in which the body's disease-fighting system (immune system) attacks  the small intestine and causes damage. This is known as an autoimmune disease. When a person with celiac disease eats a food that has gluten in it, the immune system attacks the cells that line the small intestine. Over time, this reaction damages the small intestine and makes the small intestine unable to absorb nutrients from food. Gluten is found in wheat, rye, and barley, and in foods like pasta, pizza, and cereal. Celiac disease is also known as celiac sprue, nontropical sprue, and gluten-sensitive enteropathy. What are the causes? This condition is caused by a gene that is passed from parent to child (inherited). What increases the risk? You are more likely to develop this condition if you: Have a family member with the disease. Have an autoimmune condition, such as Type 1 diabetes or a thyroid disorder. Are female. What are the signs or symptoms? Symptoms of this condition include: Recurring bloating, pain in the abdomen, and gas. Long-term (chronic) diarrhea and pale, bad-smelling, greasy, or oily stool. Weight loss. Missed menstrual periods. Weak bones (osteoporosis). Fatigue and weakness. Tingling or other signs of nerve damage. Depression. Poor appetite. Rash. Anemia. In some cases, there are no symptoms of this condition. How is this diagnosed? This condition is diagnosed with a physical exam and tests. Tests may include: Blood tests to check for nutritional deficiencies. Blood tests to look for evidence that the body is attacking cells in the small intestine. A test in which a sample of tissue is taken from the small intestine and looked at under a microscope (biopsy). X-rays of the intestine. Stool tests. Tests to check for nutrient absorption from the intestine. How is this treated? There is no cure for this condition. It is managed by following a strict gluten-free diet. Treatment may also involve avoiding dairy foods, such as milk and cheese, because they are  difficult to digest. Most people who follow a strict gluten-free diet feel better and stop having symptoms. The intestine usually heals within 3 months to 2 years. In a small percentage of people, this condition does not improve on a gluten-free diet. If your condition does not improve, more tests may be done. You may also need to work with a celiac disease specialist to find the best treatment for you. Follow these instructions at home:  Follow instructions from your health care provider about what you may eat and drink. Monitor your body's response to the gluten-free diet. Write down any changes in your symptoms and in how you feel. If you eat while you are away from home, prepare your food ahead of time. Or, make sure that the place where you are going has gluten-free options. Suggest to family members that they get screened for early signs of celiac disease. Keep all follow-up visits. This is important. Contact a health care provider if: You continue to have symptoms, even when you are eating a gluten-free diet. You have trouble following a gluten-free diet. You develop an itchy rash with groups of tiny blisters. You develop severe weakness. You develop balance problems. You develop new symptoms. Summary Celiac disease is a condition in which the body's disease-fighting system (immune system) attacks the small intestine and causes damage. There is no cure  for this condition. It is managed by following a strict gluten-free diet. For some people, following a low-dairy or dairy-free diet also helps. Follow instructions from your health care provider about what you may eat and drink. Write down any changes in your symptoms and in how you feel. Contact a health care provider if you continue to have symptoms, even when you are eating a gluten-free diet, or if you have new symptoms. Keep all follow-up visits. This is important. This information is not intended to replace advice given to you by  your health care provider. Make sure you discuss any questions you have with your health care provider. Document Revised: 06/10/2021 Document Reviewed: 06/10/2021 Elsevier Patient Education  [place].

## 2022-02-25 NOTE — Progress Notes (Signed)
   Holly Pineda wanted to come in and say good bye to me.  We also discussed her thyroid U/S which came back normal.  HIV testing , Vit D testing and Celiac testing.  She will follow up with Apolonio Schneiders  NP for these lab tests.   It has been a pleasure to treat Holly Pineda though out the years.

## 2022-02-25 NOTE — Progress Notes (Signed)
   Follow-Up Visit   Subjective  Holly Pineda is a 41 y.o. female who presents for the following: Facial Elastosis (Patient here today for fillers at cheeks).       The following portions of the chart were reviewed this encounter and updated as appropriate:   Tobacco  Allergies  Meds  Problems  Med Hx  Surg Hx  Fam Hx      Review of Systems:  No other skin or systemic complaints except as noted in HPI or Assessment and Plan.  Objective  Well appearing patient in no apparent distress; mood and affect are within normal limits.  A focused examination was performed including face. Relevant physical exam findings are noted in the Assessment and Plan.  face Rhytides and volume loss.                        Assessment & Plan  Elastosis of skin face  Will plan to increase Botox of forehead to 7 units at follow-up.  Filling material injection - face Location: See attached image  Informed consent: Discussed risks (infection, pain, bleeding, bruising, swelling, allergic reaction, paralysis of nearby muscles, eyelid droop, double vision, neck weakness, difficulty breathing, headache, undesirable cosmetic result, and need for additional treatment) and benefits of the procedure, as well as the alternatives.  Informed consent was obtained.  Preparation: The area was cleansed with alcohol.  Procedure Details:  Botox was injected into the dermis with a 30-gauge needle. Pressure applied to any bleeding. Ice packs offered for swelling.  Lot Number:  [id number] Expiration:  04/2024  Total Units Injected:  1  Plan: Tylenol may be used for headache.  Allow 2 weeks before returning to clinic for additional dosing as needed. Patient will call for any problems.   Filling material injection - face Prior to the procedure, the patient's past medical history, allergies and the rare but potential risks and complications were reviewed with the patient and a signed  consent was obtained. Pre and post-treatment care was discussed and instructions provided.  Location: cheeks  Filler Type: Juvederm Voluma  Procedure: The area was prepped thoroughly with Puracyn. After introducing the needle into the desired treatment area, the syringe plunger was drawn back to ensure there was no flash of blood prior to injecting the filler in order to minimize risk of intravascular injection and vascular occlusion. A cannula was used to inject the filler. Insertion sites were prepped with puracyn and lidocaine with epinephrine was injected to achieve good local anesthesia. A 23-gauge needle was used to create an opening at the insertion site and then the cannula was inserted using sterile technique. Prior to injecting filler at the desired location, the syringe plunger was drawn back to ensure there was no flash of blood in order to minimize risk of intravascular injection and vascular occlusion.  After injection of the filler, the treated areas were cleansed and iced to reduce swelling. Post-treatment instructions were reviewed with the patient.       Patient tolerated the procedure well. The patient will call with any problems, questions or concerns prior to their next appointment.    Return for as scheduled.  Graciella Belton, RMA, am acting as scribe for Forest Gleason, MD .  Documentation: I have reviewed the above documentation for accuracy and completeness, and I agree with the above.  Forest Gleason, MD

## 2022-02-26 ENCOUNTER — Encounter: Payer: Self-pay | Admitting: Dermatology

## 2022-03-02 ENCOUNTER — Encounter: Payer: Self-pay | Admitting: Dermatology

## 2022-03-03 ENCOUNTER — Other Ambulatory Visit: Payer: BC Managed Care – PPO | Admitting: Nurse Practitioner

## 2022-03-04 ENCOUNTER — Ambulatory Visit (INDEPENDENT_AMBULATORY_CARE_PROVIDER_SITE_OTHER): Payer: BC Managed Care – PPO | Admitting: Dermatology

## 2022-03-04 ENCOUNTER — Encounter: Payer: Self-pay | Admitting: Nurse Practitioner

## 2022-03-04 ENCOUNTER — Other Ambulatory Visit (INDEPENDENT_AMBULATORY_CARE_PROVIDER_SITE_OTHER): Payer: Self-pay | Admitting: Nurse Practitioner

## 2022-03-04 ENCOUNTER — Encounter: Payer: Self-pay | Admitting: Dermatology

## 2022-03-04 VITALS — BP 112/60 | HR 93 | Temp 98.0°F

## 2022-03-04 DIAGNOSIS — R140 Abdominal distension (gaseous): Secondary | ICD-10-CM

## 2022-03-04 DIAGNOSIS — L988 Other specified disorders of the skin and subcutaneous tissue: Secondary | ICD-10-CM

## 2022-03-04 NOTE — Patient Instructions (Signed)
Due to recent changes in healthcare laws, you may see results of your pathology and/or laboratory studies on MyChart before the doctors have had a chance to review them. We understand that in some cases there may be results that are confusing or concerning to you. Please understand that not all results are received at the same time and often the doctors may need to interpret multiple results in order to provide you with the best plan of care or course of treatment. Therefore, we ask that you please give us 2 business days to thoroughly review all your results before contacting the office for clarification. Should we see a critical lab result, you will be contacted sooner.   If You Need Anything After Your Visit  If you have any questions or concerns for your doctor, please call our main line at [phone] and press option 4 to reach your doctor's medical assistant. If no one answers, please leave a voicemail as directed and we will return your call as soon as possible. Messages left after 4 pm will be answered the following business day.   You may also send us a message via MyChart. We typically respond to MyChart messages within 1-2 business days.  For prescription refills, please ask your pharmacy to contact our office. Our fax number is [phone].  If you have an urgent issue when the clinic is closed that cannot wait until the next business day, you can page your doctor at the number below.    Please note that while we do our best to be available for urgent issues outside of office hours, we are not available 24/7.   If you have an urgent issue and are unable to reach us, you may choose to seek medical care at your doctor's office, retail clinic, urgent care center, or emergency room.  If you have a medical emergency, please immediately call 911 or go to the emergency department.  Pager Numbers  - Dr. Kowalski: [phone]  - Dr. Moye: [phone]  - Dr. Stewart:  [phone]  In the event of inclement weather, please call our main line at [phone] for an update on the status of any delays or closures.  Dermatology Medication Tips: Please keep the boxes that topical medications come in in order to help keep track of the instructions about where and how to use these. Pharmacies typically print the medication instructions only on the boxes and not directly on the medication tubes.   If your medication is too expensive, please contact our office at [phone] option 4 or send us a message through MyChart.   We are unable to tell what your co-pay for medications will be in advance as this is different depending on your insurance coverage. However, we may be able to find a substitute medication at lower cost or fill out paperwork to get insurance to cover a needed medication.   If a prior authorization is required to get your medication covered by your insurance company, please allow us 1-2 business days to complete this process.  Drug prices often vary depending on where the prescription is filled and some pharmacies may offer cheaper prices.  The website www.goodrx.com contains coupons for medications through different pharmacies. The prices here do not account for what the cost may be with help from insurance (it may be cheaper with your insurance), but the website can give you the price if you did not use any insurance.  - You can print the associated coupon and take it with   your prescription to the pharmacy.  - You may also stop by our office during regular business hours and pick up a GoodRx coupon card.  - If you need your prescription sent electronically to a different pharmacy, notify our office through [facility] MyChart or by phone at [phone] option 4.     Si Usted Necesita Algo Despus de Su Visita  Tambin puede enviarnos un mensaje a travs de MyChart. Por lo general respondemos a los mensajes de MyChart en el transcurso de 1 a 2  das hbiles.  Para renovar recetas, por favor pida a su farmacia que se ponga en contacto con nuestra oficina. Nuestro nmero de fax es el [phone].  Si tiene un asunto urgente cuando la clnica est cerrada y que no puede esperar hasta el siguiente da hbil, puede llamar/localizar a su doctor(a) al nmero que aparece a continuacin.   Por favor, tenga en cuenta que aunque hacemos todo lo posible para estar disponibles para asuntos urgentes fuera del horario de oficina, no estamos disponibles las 24 horas del da, los 7 das de la semana.   Si tiene un problema urgente y no puede comunicarse con nosotros, puede optar por buscar atencin mdica  en el consultorio de su doctor(a), en una clnica privada, en un centro de atencin urgente o en una sala de emergencias.  Si tiene una emergencia mdica, por favor llame inmediatamente al 911 o vaya a la sala de emergencias.  Nmeros de bper  - Dr. Kowalski: [phone]  - Dra. Moye: [phone]  - Dra. Stewart: [phone]  En caso de inclemencias del tiempo, por favor llame a nuestra lnea principal al [phone] para una actualizacin sobre el estado de cualquier retraso o cierre.  Consejos para la medicacin en dermatologa: Por favor, guarde las cajas en las que vienen los medicamentos de uso tpico para ayudarle a seguir las instrucciones sobre dnde y cmo usarlos. Las farmacias generalmente imprimen las instrucciones del medicamento slo en las cajas y no directamente en los tubos del medicamento.   Si su medicamento es muy caro, por favor, pngase en contacto con nuestra oficina llamando al [phone] y presione la opcin 4 o envenos un mensaje a travs de MyChart.   No podemos decirle cul ser su copago por los medicamentos por adelantado ya que esto es diferente dependiendo de la cobertura de su seguro. Sin embargo, es posible que podamos encontrar un medicamento sustituto a menor costo o llenar un formulario para que el  seguro cubra el medicamento que se considera necesario.   Si se requiere una autorizacin previa para que su compaa de seguros cubra su medicamento, por favor permtanos de 1 a 2 das hbiles para completar este proceso.  Los precios de los medicamentos varan con frecuencia dependiendo del lugar de dnde se surte la receta y alguna farmacias pueden ofrecer precios ms baratos.  El sitio web www.goodrx.com tiene cupones para medicamentos de diferentes farmacias. Los precios aqu no tienen en cuenta lo que podra costar con la ayuda del seguro (puede ser ms barato con su seguro), pero el sitio web puede darle el precio si no utiliz ningn seguro.  - Puede imprimir el cupn correspondiente y llevarlo con su receta a la farmacia.  - Tambin puede pasar por nuestra oficina durante el horario de atencin regular y recoger una tarjeta de cupones de GoodRx.  - Si necesita que su receta se enve electrnicamente a una farmacia diferente, informe a nuestra oficina a travs de MyChart de [facility]   o por telfono llamando al [phone] y presione la opcin 4.  

## 2022-03-04 NOTE — Progress Notes (Signed)
Patient here requesting bloodwork follow up as recommended by Daryll Drown at last appointment   Her PCP has now left this practice and we will help her transition to a new provider at this appointment as well.   Will follow up with lab results when available.   She is now scheduled to see Carollee Leitz 06/16/22 for transition to primary care, and can continue to be seen at [facility] for acute needs.

## 2022-03-04 NOTE — Progress Notes (Signed)
   Follow-Up Visit   Subjective  Holly Pineda is a 41 y.o. female who presents for the following: Follow-up (Recheck face. Filler injected 02/25/2022).   The following portions of the chart were reviewed this encounter and updated as appropriate:  Tobacco  Allergies  Meds  Problems  Med Hx  Surg Hx  Fam Hx      Review of Systems: No other skin or systemic complaints except as noted in HPI or Assessment and Plan.   Objective  Well appearing patient in no apparent distress; mood and affect are within normal limits.  A focused examination was performed including face. Relevant physical exam findings are noted in the Assessment and Plan.  face    Assessment & Plan  Elastosis of skin face  Good results of filler. Advised areas will settle more with time. For further improvement at nasolabial folds would not suggest additional mid-face filler, would need filler to nasolabial folds - would suggest Restylane lyft.    Return for Botox Follow Up as scheduled.  I, Emelia Salisbury, CMA, am acting as scribe for Forest Gleason, MD.  Documentation: I have reviewed the above documentation for accuracy and completeness, and I agree with the above.  Forest Gleason, MD

## 2022-03-06 ENCOUNTER — Encounter: Payer: Self-pay | Admitting: Nurse Practitioner

## 2022-03-06 LAB — VITAMIN D 25 HYDROXY (VIT D DEFICIENCY, FRACTURES): Vit D, 25-Hydroxy: 56 ng/mL (ref 30.0–100.0)

## 2022-03-06 LAB — URINE CULTURE

## 2022-03-06 LAB — CELIAC DISEASE PANEL
Endomysial IgA: NEGATIVE
IgA/Immunoglobulin A, Serum: 141 mg/dL (ref 87–352)
Transglutaminase IgA: <2 U/mL (ref 0–3)

## 2022-03-10 DIAGNOSIS — A630 Anogenital (venereal) warts: Secondary | ICD-10-CM | POA: Diagnosis not present

## 2022-03-11 ENCOUNTER — Encounter: Payer: Self-pay | Admitting: Dermatology

## 2022-03-24 NOTE — Telephone Encounter (Signed)
Could you please offer Holly Pineda a 20 minute appointment to recheck her filler and assess whether she would benefit from under eye filler ideally shortly after September 13th? Thank you!

## 2022-03-25 DIAGNOSIS — A630 Anogenital (venereal) warts: Secondary | ICD-10-CM | POA: Diagnosis not present

## 2022-03-26 ENCOUNTER — Ambulatory Visit (INDEPENDENT_AMBULATORY_CARE_PROVIDER_SITE_OTHER): Payer: BC Managed Care – PPO | Admitting: Dermatology

## 2022-03-26 ENCOUNTER — Encounter: Payer: Self-pay | Admitting: Dermatology

## 2022-03-26 DIAGNOSIS — L988 Other specified disorders of the skin and subcutaneous tissue: Secondary | ICD-10-CM

## 2022-03-26 NOTE — Progress Notes (Signed)
   Follow-Up Visit   Subjective  Holly Pineda is a 41 y.o. female who presents for the following: Facial Elastosis (Follow up for filler. Would like to make sure symmetrical ).   The following portions of the chart were reviewed this encounter and updated as appropriate:  Tobacco  Allergies  Meds  Problems  Med Hx  Surg Hx  Fam Hx      Review of Systems: No other skin or systemic complaints except as noted in HPI or Assessment and Plan.   Objective  Well appearing patient in no apparent distress; mood and affect are within normal limits.  A focused examination was performed including face. Relevant physical exam findings are noted in the Assessment and Plan.  face Rhytides and volume loss.           Assessment & Plan  Elastosis of skin face  Minimal visible or palpable asymmetry between left and right. Consider adding an additional syringe of filler to mid-face.  Do not recommend undereye filler at this time. Consider Skintyte   Return in about 3 weeks (around 04/16/2022) for Botox Follow Up as scheduled.  I, Emelia Salisbury, CMA, am acting as scribe for Forest Gleason, MD.  Documentation: I have reviewed the above documentation for accuracy and completeness, and I agree with the above.  Forest Gleason, MD

## 2022-03-26 NOTE — Patient Instructions (Signed)
Due to recent changes in healthcare laws, you may see results of your pathology and/or laboratory studies on MyChart before the doctors have had a chance to review them. We understand that in some cases there may be results that are confusing or concerning to you. Please understand that not all results are received at the same time and often the doctors may need to interpret multiple results in order to provide you with the best plan of care or course of treatment. Therefore, we ask that you please give us 2 business days to thoroughly review all your results before contacting the office for clarification. Should we see a critical lab result, you will be contacted sooner.   If You Need Anything After Your Visit  If you have any questions or concerns for your doctor, please call our main line at [phone] and press option 4 to reach your doctor's medical assistant. If no one answers, please leave a voicemail as directed and we will return your call as soon as possible. Messages left after 4 pm will be answered the following business day.   You may also send us a message via MyChart. We typically respond to MyChart messages within 1-2 business days.  For prescription refills, please ask your pharmacy to contact our office. Our fax number is [phone].  If you have an urgent issue when the clinic is closed that cannot wait until the next business day, you can page your doctor at the number below.    Please note that while we do our best to be available for urgent issues outside of office hours, we are not available 24/7.   If you have an urgent issue and are unable to reach us, you may choose to seek medical care at your doctor's office, retail clinic, urgent care center, or emergency room.  If you have a medical emergency, please immediately call 911 or go to the emergency department.  Pager Numbers  - Dr. Kowalski: [phone]  - Dr. Moye: [phone]  - Dr. Stewart:  [phone]  In the event of inclement weather, please call our main line at [phone] for an update on the status of any delays or closures.  Dermatology Medication Tips: Please keep the boxes that topical medications come in in order to help keep track of the instructions about where and how to use these. Pharmacies typically print the medication instructions only on the boxes and not directly on the medication tubes.   If your medication is too expensive, please contact our office at [phone] option 4 or send us a message through MyChart.   We are unable to tell what your co-pay for medications will be in advance as this is different depending on your insurance coverage. However, we may be able to find a substitute medication at lower cost or fill out paperwork to get insurance to cover a needed medication.   If a prior authorization is required to get your medication covered by your insurance company, please allow us 1-2 business days to complete this process.  Drug prices often vary depending on where the prescription is filled and some pharmacies may offer cheaper prices.  The website www.goodrx.com contains coupons for medications through different pharmacies. The prices here do not account for what the cost may be with help from insurance (it may be cheaper with your insurance), but the website can give you the price if you did not use any insurance.  - You can print the associated coupon and take it with   your prescription to the pharmacy.  - You may also stop by our office during regular business hours and pick up a GoodRx coupon card.  - If you need your prescription sent electronically to a different pharmacy, notify our office through [facility] MyChart or by phone at [phone] option 4.     Si Usted Necesita Algo Despus de Su Visita  Tambin puede enviarnos un mensaje a travs de MyChart. Por lo general respondemos a los mensajes de MyChart en el transcurso de 1 a 2  das hbiles.  Para renovar recetas, por favor pida a su farmacia que se ponga en contacto con nuestra oficina. Nuestro nmero de fax es el [phone].  Si tiene un asunto urgente cuando la clnica est cerrada y que no puede esperar hasta el siguiente da hbil, puede llamar/localizar a su doctor(a) al nmero que aparece a continuacin.   Por favor, tenga en cuenta que aunque hacemos todo lo posible para estar disponibles para asuntos urgentes fuera del horario de oficina, no estamos disponibles las 24 horas del da, los 7 das de la semana.   Si tiene un problema urgente y no puede comunicarse con nosotros, puede optar por buscar atencin mdica  en el consultorio de su doctor(a), en una clnica privada, en un centro de atencin urgente o en una sala de emergencias.  Si tiene una emergencia mdica, por favor llame inmediatamente al 911 o vaya a la sala de emergencias.  Nmeros de bper  - Dr. Kowalski: [phone]  - Dra. Moye: [phone]  - Dra. Stewart: [phone]  En caso de inclemencias del tiempo, por favor llame a nuestra lnea principal al [phone] para una actualizacin sobre el estado de cualquier retraso o cierre.  Consejos para la medicacin en dermatologa: Por favor, guarde las cajas en las que vienen los medicamentos de uso tpico para ayudarle a seguir las instrucciones sobre dnde y cmo usarlos. Las farmacias generalmente imprimen las instrucciones del medicamento slo en las cajas y no directamente en los tubos del medicamento.   Si su medicamento es muy caro, por favor, pngase en contacto con nuestra oficina llamando al [phone] y presione la opcin 4 o envenos un mensaje a travs de MyChart.   No podemos decirle cul ser su copago por los medicamentos por adelantado ya que esto es diferente dependiendo de la cobertura de su seguro. Sin embargo, es posible que podamos encontrar un medicamento sustituto a menor costo o llenar un formulario para que el  seguro cubra el medicamento que se considera necesario.   Si se requiere una autorizacin previa para que su compaa de seguros cubra su medicamento, por favor permtanos de 1 a 2 das hbiles para completar este proceso.  Los precios de los medicamentos varan con frecuencia dependiendo del lugar de dnde se surte la receta y alguna farmacias pueden ofrecer precios ms baratos.  El sitio web www.goodrx.com tiene cupones para medicamentos de diferentes farmacias. Los precios aqu no tienen en cuenta lo que podra costar con la ayuda del seguro (puede ser ms barato con su seguro), pero el sitio web puede darle el precio si no utiliz ningn seguro.  - Puede imprimir el cupn correspondiente y llevarlo con su receta a la farmacia.  - Tambin puede pasar por nuestra oficina durante el horario de atencin regular y recoger una tarjeta de cupones de GoodRx.  - Si necesita que su receta se enve electrnicamente a una farmacia diferente, informe a nuestra oficina a travs de MyChart de [facility] [place]   o por telfono llamando al [phone] y presione la opcin 4.  

## 2022-03-30 ENCOUNTER — Encounter: Payer: Self-pay | Admitting: Dermatology

## 2022-04-01 DIAGNOSIS — Z319 Encounter for procreative management, unspecified: Secondary | ICD-10-CM | POA: Diagnosis not present

## 2022-04-01 DIAGNOSIS — N979 Female infertility, unspecified: Secondary | ICD-10-CM | POA: Diagnosis not present

## 2022-04-01 DIAGNOSIS — Z3181 Encounter for male factor infertility in female patient: Secondary | ICD-10-CM | POA: Diagnosis not present

## 2022-04-07 DIAGNOSIS — A630 Anogenital (venereal) warts: Secondary | ICD-10-CM | POA: Diagnosis not present

## 2022-04-08 ENCOUNTER — Other Ambulatory Visit: Payer: Self-pay

## 2022-04-08 DIAGNOSIS — N921 Excessive and frequent menstruation with irregular cycle: Secondary | ICD-10-CM

## 2022-04-10 ENCOUNTER — Encounter: Payer: Self-pay | Admitting: Nurse Practitioner

## 2022-04-10 LAB — ESTRADIOL: Estradiol: 29.3 pg/mL

## 2022-04-10 LAB — PROGESTERONE: Progesterone: 0.3 ng/mL

## 2022-04-10 LAB — FOLLICLE STIMULATING HORMONE: FSH: 15.2 m[IU]/mL

## 2022-04-22 ENCOUNTER — Ambulatory Visit (INDEPENDENT_AMBULATORY_CARE_PROVIDER_SITE_OTHER): Payer: Self-pay | Admitting: Nurse Practitioner

## 2022-04-22 ENCOUNTER — Encounter: Payer: Self-pay | Admitting: Nurse Practitioner

## 2022-04-22 VITALS — Temp 98.4°F

## 2022-04-22 DIAGNOSIS — N898 Other specified noninflammatory disorders of vagina: Secondary | ICD-10-CM

## 2022-04-22 LAB — POCT URINALYSIS DIPSTICK OB
Bilirubin, UA: NEGATIVE
Blood, UA: NEGATIVE
Glucose, UA: NEGATIVE
Ketones, UA: NEGATIVE
Nitrite, UA: NEGATIVE
Spec Grav, UA: 1.01 (ref 1.010–1.025)
Urobilinogen, UA: 0.2 E.U./dL
pH, UA: 6 (ref 5.0–8.0)

## 2022-04-22 NOTE — Progress Notes (Signed)
[facility] [address], [place] [address] [phone]  Office Visit Note  Patient Name: Holly Pineda Date of Birth [id number]  Medical Record number [id number]  Date of Service: 04/22/2022  Chief Complaint  Patient presents with   Urinary Tract Infection    Was in a hot tube on Sun. Vignal itching and feels swollen.      HPI 41 year old female presenting with vaginal itching. She was in a hot tub over the weekend that had bath salts in it.   She feels like her vagina is inflamed.   Denies pain or burning with urination. Slight discharge no odor.   No risk for STI.     Current Medication:  Outpatient Encounter Medications as of 04/22/2022  Medication Sig   levothyroxine (SYNTHROID) 50 MCG tablet TAKE 1 TABLET BY MOUTH EVERY DAY   Multiple Vitamin (MULTIVITAMIN) capsule Take 1 capsule by mouth daily.   Azelastine HCl 137 MCG/SPRAY SOLN PLACE 2 SPRAYS INTO BOTH NOSTRILS 2 (TWO) TIMES DAILY. USE IN EACH NOSTRIL AS DIRECTED   clindamycin-benzoyl peroxide (BENZACLIN) gel Apply in morning to spot treat as needed. (Patient not taking: Reported on 12/17/2021)   levonorgestrel-ethinyl estradiol (ALESSE) 0.1-20 MG-MCG tablet Take 1 tablet by mouth daily. (Patient not taking: Reported on 07/15/2021)   tretinoin (RETIN-A) 0.05 % cream Apply topically at bedtime.   triamcinolone cream (KENALOG) 0.1 % Apply to rash BID up to two weeks. Avoid applying to face, groin, and axilla. Use as directed. Long-term use can cause thinning of the skin. (Patient not taking: Reported on 02/25/2022)   No facility-administered encounter medications on file as of 04/22/2022.      Medical History: Past Medical History:  Diagnosis Date   Allergy    Anxiety 02/13/2019   Atypical mole 06/05/2015   right lower back/mild, right lateral abdomen/mild   Atypical mole 08/20/2015   right upper back paraspinal/excision   Atypical mole 08/27/2015   right superior buttock/excision    Atypical mole 02/24/2016   right inframammary/mild   Atypical mole 10/21/2017   left lower back/moderate   Basal cell carcinoma 09/04/2013   left sternum mid chest     Vital Signs: Temp 98.4 F (36.9 C) (Tympanic)    Review of Systems  Constitutional: Negative.   HENT: Negative.    Respiratory: Negative.    Genitourinary:  Positive for vaginal discharge. Negative for vaginal bleeding and vaginal pain.       Vaginal itching   Neurological: Negative.     Physical Exam HENT:     Head: Normocephalic.  Pulmonary:     Effort: Pulmonary effort is normal.  Neurological:     General: No focal deficit present.     Mental Status: She is alert.  Psychiatric:        Mood and Affect: Mood normal.       Assessment/Plan: 1. Vaginal itching  - POC Urinalysis Dipstick OB normal     General Counseling: Holly Pineda verbalizes understanding of the findings of todays visit and agrees with plan of treatment. I have discussed any further diagnostic evaluation that may be needed or ordered today. We also reviewed her medications today. she has been encouraged to call the office with any questions or concerns that should arise related to todays visit.    Time spent:[address] [facility] Family Nurse Practitioner

## 2022-04-23 ENCOUNTER — Other Ambulatory Visit: Payer: Self-pay | Admitting: Nurse Practitioner

## 2022-04-23 ENCOUNTER — Encounter: Payer: Self-pay | Admitting: Nurse Practitioner

## 2022-04-23 DIAGNOSIS — B3731 Acute candidiasis of vulva and vagina: Secondary | ICD-10-CM

## 2022-04-23 MED ORDER — FLUCONAZOLE 150 MG PO TABS: 150.0000 mg | ORAL_TABLET | Freq: Once | ORAL | 0 refills | Status: AC

## 2022-04-24 ENCOUNTER — Other Ambulatory Visit: Payer: Self-pay

## 2022-04-24 DIAGNOSIS — N920 Excessive and frequent menstruation with regular cycle: Secondary | ICD-10-CM

## 2022-04-26 LAB — PROGESTERONE: Progesterone: 5 ng/mL

## 2022-04-26 LAB — ESTRADIOL: Estradiol: 65.7 pg/mL

## 2022-04-26 LAB — FOLLICLE STIMULATING HORMONE: FSH: 13.1 m[IU]/mL

## 2022-04-28 ENCOUNTER — Ambulatory Visit: Payer: BC Managed Care – PPO | Admitting: Dermatology

## 2022-04-28 DIAGNOSIS — A630 Anogenital (venereal) warts: Secondary | ICD-10-CM | POA: Diagnosis not present

## 2022-04-30 ENCOUNTER — Other Ambulatory Visit: Payer: Self-pay

## 2022-04-30 ENCOUNTER — Other Ambulatory Visit (INDEPENDENT_AMBULATORY_CARE_PROVIDER_SITE_OTHER): Payer: Self-pay | Admitting: Physician Assistant

## 2022-04-30 VITALS — BP 110/70 | HR 100 | Temp 99.0°F | Wt 105.0 lb

## 2022-04-30 DIAGNOSIS — E039 Hypothyroidism, unspecified: Secondary | ICD-10-CM

## 2022-04-30 DIAGNOSIS — J069 Acute upper respiratory infection, unspecified: Secondary | ICD-10-CM

## 2022-04-30 NOTE — Progress Notes (Signed)
[facility] [address], [place] [address]   Office Visit Note  Patient Name: Holly Pineda Date of Birth [id number]  Medical Record number [id number]  Date of Service: 04/30/2022  Chief Complaint  Patient presents with   URI     41 y/o F presents to the clinic for c/o post nasal drainage, sinus pressure, sore throat x 2 days. PT today feels better than yesterday. No known covid exposure. Denies CP, SOB, body aches, or fever. Hasn't taken any otc medicines for her symptoms.  Pt also has an order for blood test, TSH, from her PCP.   URI  Associated symptoms include congestion and a sore throat. Pertinent negatives include no sinus pain.      Current Medication:  Outpatient Encounter Medications as of 04/30/2022  Medication Sig   Azelastine HCl 137 MCG/SPRAY SOLN PLACE 2 SPRAYS INTO BOTH NOSTRILS 2 (TWO) TIMES DAILY. USE IN EACH NOSTRIL AS DIRECTED   levothyroxine (SYNTHROID) 50 MCG tablet TAKE 1 TABLET BY MOUTH EVERY DAY   Multiple Vitamin (MULTIVITAMIN) capsule Take 1 capsule by mouth daily.   tretinoin (RETIN-A) 0.05 % cream Apply topically at bedtime.   [DISCONTINUED] clindamycin-benzoyl peroxide (BENZACLIN) gel Apply in morning to spot treat as needed. (Patient not taking: Reported on 12/17/2021)   [DISCONTINUED] levonorgestrel-ethinyl estradiol (ALESSE) 0.1-20 MG-MCG tablet Take 1 tablet by mouth daily. (Patient not taking: Reported on 07/15/2021)   [DISCONTINUED] triamcinolone cream (KENALOG) 0.1 % Apply to rash BID up to two weeks. Avoid applying to face, groin, and axilla. Use as directed. Long-term use can cause thinning of the skin. (Patient not taking: Reported on 02/25/2022)   No facility-administered encounter medications on file as of 04/30/2022.      Medical History: Past Medical History:  Diagnosis Date   Allergy    Anxiety 02/13/2019   Atypical mole 06/05/2015   right lower back/mild, right lateral abdomen/mild   Atypical mole  08/20/2015   right upper back paraspinal/excision   Atypical mole 08/27/2015   right superior buttock/excision   Atypical mole 02/24/2016   right inframammary/mild   Atypical mole 10/21/2017   left lower back/moderate   Basal cell carcinoma 09/04/2013   left sternum mid chest     Vital Signs: BP 110/70 (BP Location: Left Arm, Patient Position: Sitting, Cuff Size: Normal)   Pulse 100   Temp 99 F (37.2 C) (Tympanic)   Wt 105 lb (47.6 kg)   SpO2 (!) 16%   BMI 20.51 kg/m    Review of Systems  Constitutional: Negative.   HENT:  Positive for congestion, postnasal drip, sinus pressure and sore throat. Negative for sinus pain and trouble swallowing.   Cardiovascular: Negative.   Gastrointestinal: Negative.     Physical Exam Constitutional:      Appearance: Normal appearance.  HENT:     Head: Atraumatic.     Right Ear: Tympanic membrane, ear canal and external ear normal.     Left Ear: Tympanic membrane, ear canal and external ear normal.     Nose: Nose normal.     Mouth/Throat:     Mouth: Mucous membranes are moist.     Pharynx: Oropharynx is clear.  Eyes:     Extraocular Movements: Extraocular movements intact.  Cardiovascular:     Rate and Rhythm: Normal rate and regular rhythm.  Pulmonary:     Effort: Pulmonary effort is normal.     Breath sounds: Normal breath sounds.  Musculoskeletal:     Cervical back:  Neck supple.  Skin:    General: Skin is warm.  Neurological:     Mental Status: She is alert.  Psychiatric:        Mood and Affect: Mood normal.        Behavior: Behavior normal.        Thought Content: Thought content normal.        Judgment: Judgment normal.       Assessment/Plan:   ICD-10-CM   1. Viral upper respiratory tract infection  J06.9     2. Hypothyroidism, unspecified type  E03.9 TSH    CANCELED: TSH      Increase fluids. Take otc anti-histamine ie Zyrtec or allegra. Use nasal spray as directed on the bottle. Start a humidifier at  home as instructed. IF sinus pressure or pain worsens then take otc oral pseudoephedrine as directed on the box. Pt verbalized understanding and in agreement.  Blood was drawn in left AC fossa per test ordered by PCP.     General Counseling: olevia westervelt understanding of the findings of todays visit and agrees with plan of treatment. I have discussed any further diagnostic evaluation that may be needed or ordered today. We also reviewed her medications today. she has been encouraged to call the office with any questions or concerns that should arise related to todays visit.    Time spent:20 [place], [place] Physician Assistant

## 2022-04-30 NOTE — Progress Notes (Signed)
Abs only

## 2022-05-02 LAB — TSH: TSH: 2.94 u[IU]/mL (ref 0.450–4.500)

## 2022-05-05 ENCOUNTER — Encounter: Payer: Self-pay | Admitting: Physician Assistant

## 2022-05-05 ENCOUNTER — Ambulatory Visit (INDEPENDENT_AMBULATORY_CARE_PROVIDER_SITE_OTHER): Payer: Self-pay | Admitting: Physician Assistant

## 2022-05-05 VITALS — BP 116/70 | HR 100 | Temp 98.5°F

## 2022-05-05 DIAGNOSIS — J069 Acute upper respiratory infection, unspecified: Secondary | ICD-10-CM

## 2022-05-05 NOTE — Progress Notes (Signed)
[facility] [address],  [address]   Office Visit Note  Patient Name: Holly Pineda Date of Birth [id number]  Medical Record number [id number]  Date of Service: 05/05/2022  Chief Complaint  Patient presents with   covid test    Pt would like a COVID test. Sinus still the same. Son and law is sick. Cough, headache and stuffy nose.      41 y/o F presents to the clinic for c/o cough and post nasal drainage x few days. Pt was seen last week for URI, but admits that she is feeling better overall. However, she has continued to cough. Today, she states that her son-in-law is not feeling well and will get home tested for covid later today. She denies CP, SOB, wheezing, fever, body aches.       Current Medication:  Outpatient Encounter Medications as of 05/05/2022  Medication Sig   Azelastine HCl 137 MCG/SPRAY SOLN PLACE 2 SPRAYS INTO BOTH NOSTRILS 2 (TWO) TIMES DAILY. USE IN EACH NOSTRIL AS DIRECTED   levothyroxine (SYNTHROID) 50 MCG tablet TAKE 1 TABLET BY MOUTH EVERY DAY   Multiple Vitamin (MULTIVITAMIN) capsule Take 1 capsule by mouth daily.   tretinoin (RETIN-A) 0.05 % cream Apply topically at bedtime.   No facility-administered encounter medications on file as of 05/05/2022.      Medical History: Past Medical History:  Diagnosis Date   Allergy    Anxiety 02/13/2019   Atypical mole 06/05/2015   right lower back/mild, right lateral abdomen/mild   Atypical mole 08/20/2015   right upper back paraspinal/excision   Atypical mole 08/27/2015   right superior buttock/excision   Atypical mole 02/24/2016   right inframammary/mild   Atypical mole 10/21/2017   left lower back/moderate   Basal cell carcinoma 09/04/2013   left sternum mid chest     Vital Signs: BP 116/70 (BP Location: Left Arm, Patient Position: Sitting, Cuff Size: Normal)   Pulse 100   Temp 98.5 F (36.9 C) (Tympanic)   SpO2 98%    Review of Systems  Constitutional:  Negative.   HENT:  Positive for postnasal drip. Negative for sinus pressure, sinus pain, sore throat and trouble swallowing.   Respiratory:  Positive for cough. Negative for chest tightness, shortness of breath and wheezing.   Cardiovascular: Negative.   Neurological: Negative.     Physical Exam Constitutional:      Appearance: Normal appearance.  HENT:     Head: Atraumatic.     Right Ear: Tympanic membrane, ear canal and external ear normal.     Left Ear: Tympanic membrane, ear canal and external ear normal.     Nose: Nose normal.     Mouth/Throat:     Mouth: Mucous membranes are moist.     Pharynx: Oropharynx is clear.     Comments: Clear post nasal drainage present Eyes:     Extraocular Movements: Extraocular movements intact.  Cardiovascular:     Rate and Rhythm: Normal rate and regular rhythm.  Pulmonary:     Effort: Pulmonary effort is normal.     Breath sounds: Normal breath sounds.  Musculoskeletal:     Cervical back: Neck supple.  Skin:    General: Skin is warm.  Neurological:     Mental Status: She is alert.  Psychiatric:        Mood and Affect: Mood normal.        Behavior: Behavior normal.        Thought Content: Thought  content normal.        Judgment: Judgment normal.       Assessment/Plan:   ICD-10-CM   1. URI with cough and congestion  J06.9       Increase fluids Start a humidifier at home. Extended warm showers. Start otc Mucinex for cough Continue to watch for worsening symptoms. If her son-in-law is covid positive, patient will RTC for covid testing. Continue to wear a mask until asymptomatic.  Pt verbalized understanding and in agreement.   General Counseling: anaijah augsburger understanding of the findings of todays visit and agrees with plan of treatment. I have discussed any further diagnostic evaluation that may be needed or ordered today. We also reviewed her medications today. she has been encouraged to call the office with any  questions or concerns that should arise related to todays visit.    Time spent:20 [place], [place] Physician Assistant

## 2022-05-06 DIAGNOSIS — E049 Nontoxic goiter, unspecified: Secondary | ICD-10-CM | POA: Diagnosis not present

## 2022-05-06 DIAGNOSIS — E039 Hypothyroidism, unspecified: Secondary | ICD-10-CM | POA: Diagnosis not present

## 2022-05-06 DIAGNOSIS — E041 Nontoxic single thyroid nodule: Secondary | ICD-10-CM | POA: Diagnosis not present

## 2022-05-06 DIAGNOSIS — E559 Vitamin D deficiency, unspecified: Secondary | ICD-10-CM | POA: Diagnosis not present

## 2022-05-12 DIAGNOSIS — A630 Anogenital (venereal) warts: Secondary | ICD-10-CM | POA: Diagnosis not present

## 2022-05-26 DIAGNOSIS — A630 Anogenital (venereal) warts: Secondary | ICD-10-CM | POA: Diagnosis not present

## 2022-05-28 ENCOUNTER — Encounter: Payer: Self-pay | Admitting: Nurse Practitioner

## 2022-05-28 ENCOUNTER — Ambulatory Visit: Payer: BC Managed Care – PPO | Admitting: Dermatology

## 2022-05-28 ENCOUNTER — Encounter: Payer: Self-pay | Admitting: Family Medicine

## 2022-06-09 DIAGNOSIS — A630 Anogenital (venereal) warts: Secondary | ICD-10-CM | POA: Diagnosis not present

## 2022-06-16 ENCOUNTER — Ambulatory Visit: Payer: BC Managed Care – PPO | Admitting: Family Medicine

## 2022-06-18 ENCOUNTER — Ambulatory Visit (INDEPENDENT_AMBULATORY_CARE_PROVIDER_SITE_OTHER): Payer: Self-pay | Admitting: Nurse Practitioner

## 2022-06-18 ENCOUNTER — Other Ambulatory Visit: Payer: Self-pay

## 2022-06-18 ENCOUNTER — Encounter: Payer: Self-pay | Admitting: Nurse Practitioner

## 2022-06-18 VITALS — HR 84 | Temp 97.9°F

## 2022-06-18 DIAGNOSIS — E039 Hypothyroidism, unspecified: Secondary | ICD-10-CM

## 2022-06-18 DIAGNOSIS — Z1321 Encounter for screening for nutritional disorder: Secondary | ICD-10-CM

## 2022-06-18 NOTE — Progress Notes (Signed)
Needs labs for prior to new PCP apt in December

## 2022-06-19 LAB — TSH: TSH: 2.16 u[IU]/mL (ref 0.450–4.500)

## 2022-06-30 ENCOUNTER — Ambulatory Visit: Payer: BC Managed Care – PPO | Admitting: Dermatology

## 2022-07-01 ENCOUNTER — Encounter: Payer: Self-pay | Admitting: Nurse Practitioner

## 2022-07-01 ENCOUNTER — Ambulatory Visit (INDEPENDENT_AMBULATORY_CARE_PROVIDER_SITE_OTHER): Payer: Self-pay | Admitting: Nurse Practitioner

## 2022-07-01 VITALS — Temp 98.6°F

## 2022-07-01 DIAGNOSIS — Z1321 Encounter for screening for nutritional disorder: Secondary | ICD-10-CM

## 2022-07-01 NOTE — Progress Notes (Signed)
[facility] [address], [place] [address] [phone]  Office Visit Note  Patient Name: Holly Pineda Date of Birth [id number]  Medical Record number [id number]  Date of Service: 07/01/2022  Chief Complaint  Patient presents with   Follow-up    Lab review. Wants to make sure her liver is ok. Not sleeping.      HPI  41 year old female presenting to [facility] with complaints of sleep disturbance over the past week. She has been waking up early or in the middle of the night.   This is new for her, she uses magnesium and melatonin for sleep aid.    Current Medication:  Outpatient Encounter Medications as of 07/01/2022  Medication Sig   levothyroxine (SYNTHROID) 50 MCG tablet TAKE 1 TABLET BY MOUTH EVERY DAY   Melatonin 5 MG CAPS Take by mouth.   Multiple Vitamin (MULTIVITAMIN) capsule Take 1 capsule by mouth daily.   Azelastine HCl 137 MCG/SPRAY SOLN PLACE 2 SPRAYS INTO BOTH NOSTRILS 2 (TWO) TIMES DAILY. USE IN EACH NOSTRIL AS DIRECTED (Patient not taking: Reported on 06/18/2022)   tretinoin (RETIN-A) 0.05 % cream Apply topically at bedtime. (Patient not taking: Reported on 06/18/2022)   No facility-administered encounter medications on file as of 07/01/2022.      Medical History: Past Medical History:  Diagnosis Date   Allergy    Anxiety 02/13/2019   Atypical mole 06/05/2015   right lower back/mild, right lateral abdomen/mild   Atypical mole 08/20/2015   right upper back paraspinal/excision   Atypical mole 08/27/2015   right superior buttock/excision   Atypical mole 02/24/2016   right inframammary/mild   Atypical mole 10/21/2017   left lower back/moderate   Basal cell carcinoma 09/04/2013   left sternum mid chest     Vital Signs: Temp 98.6 F (37 C) (Tympanic)    Review of Systems  Constitutional: Negative.   HENT: Negative.    Respiratory: Negative.    Neurological: Negative.   Psychiatric/Behavioral:   Positive for sleep disturbance.     Physical Exam HENT:     Head: Normocephalic.  Pulmonary:     Effort: Pulmonary effort is normal.  Neurological:     General: No focal deficit present.     Mental Status: She is alert.  Psychiatric:        Mood and Affect: Mood normal.       Assessment/Plan: 1. Encounter for vitamin deficiency screening Will follow up tomorrow for lab testing  Discussed stress management  Sleep hygiene  Anxiety/ possible hydroxyzine if sleep disturbance continues   - Magnesium; Future    General Counseling: Holly Pineda understanding of the findings of todays visit and agrees with plan of treatment. I have discussed any further diagnostic evaluation that may be needed or ordered today. We also reviewed her medications today. she has been encouraged to call the office with any questions or concerns that should arise related to todays visit.    Time spent:20 Minutes   Holly Pineda [facility] Family Nurse Practitioner

## 2022-07-02 ENCOUNTER — Other Ambulatory Visit: Payer: Self-pay

## 2022-07-02 DIAGNOSIS — Z1321 Encounter for screening for nutritional disorder: Secondary | ICD-10-CM

## 2022-07-03 ENCOUNTER — Encounter: Payer: Self-pay | Admitting: Nurse Practitioner

## 2022-07-03 LAB — HGB A1C W/O EAG: Hgb A1c MFr Bld: 5.1 % (ref 4.8–5.6)

## 2022-07-03 LAB — CMP12+LP+TP+TSH+6AC+CBC/D/PLT
ALT: 14 IU/L (ref 0–32)
AST: 14 IU/L (ref 0–40)
Albumin/Globulin Ratio: 1.7 (ref 1.2–2.2)
Albumin: 4.7 g/dL (ref 3.9–4.9)
Alkaline Phosphatase: 53 IU/L (ref 44–121)
BUN/Creatinine Ratio: 17 (ref 9–23)
BUN: 14 mg/dL (ref 6–24)
Basophils Absolute: 0.1 10*3/uL (ref 0.0–0.2)
Basos: 1 %
Bilirubin Total: 0.3 mg/dL (ref 0.0–1.2)
Calcium: 9.5 mg/dL (ref 8.7–10.2)
Chloride: 102 mmol/L (ref 96–106)
Chol/HDL Ratio: 2.8 ratio (ref 0.0–4.4)
Cholesterol, Total: 188 mg/dL (ref 100–199)
Creatinine, Ser: 0.83 mg/dL (ref 0.57–1.00)
EOS (ABSOLUTE): 0.2 10*3/uL (ref 0.0–0.4)
Eos: 3 %
Estimated CHD Risk: <0.5 times avg. (ref 0.0–1.0)
Free Thyroxine Index: 2.4 (ref 1.2–4.9)
GGT: 8 IU/L (ref 0–60)
Globulin, Total: 2.8 g/dL (ref 1.5–4.5)
HDL: 66 mg/dL (ref >39)
Hematocrit: 40.8 % (ref 34.0–46.6)
Hemoglobin: 13.5 g/dL (ref 11.1–15.9)
Immature Grans (Abs): 0 10*3/uL (ref 0.0–0.1)
Immature Granulocytes: 0 %
Iron: 56 ug/dL (ref 27–159)
LDH: 134 IU/L (ref 119–226)
LDL Chol Calc (NIH): 107 mg/dL — ABNORMAL HIGH (ref 0–99)
Lymphocytes Absolute: 1.8 10*3/uL (ref 0.7–3.1)
Lymphs: 30 %
MCH: 30.8 pg (ref 26.6–33.0)
MCHC: 33.1 g/dL (ref 31.5–35.7)
MCV: 93 fL (ref 79–97)
Monocytes Absolute: 0.4 10*3/uL (ref 0.1–0.9)
Monocytes: 7 %
Neutrophils Absolute: 3.5 10*3/uL (ref 1.4–7.0)
Neutrophils: 59 %
Platelets: 218 10*3/uL (ref 150–450)
RBC: 4.38 x10E6/uL (ref 3.77–5.28)
RDW: 12.6 % (ref 11.7–15.4)
Sodium: 140 mmol/L (ref 134–144)
T3 Uptake Ratio: 29 % (ref 24–39)
T4, Total: 8.3 ug/dL (ref 4.5–12.0)
TSH: 3.49 u[IU]/mL (ref 0.450–4.500)
Total Protein: 7.5 g/dL (ref 6.0–8.5)
Triglycerides: 80 mg/dL (ref 0–149)
Uric Acid: 3.3 mg/dL (ref 2.6–6.2)
VLDL Cholesterol Cal: 15 mg/dL (ref 5–40)
WBC: 5.9 10*3/uL (ref 3.4–10.8)
eGFR: 90 mL/min/{1.73_m2} (ref >59)

## 2022-07-03 LAB — C-REACTIVE PROTEIN: CRP: <1 mg/L (ref 0–10)

## 2022-07-03 LAB — MAGNESIUM: Magnesium: 2.2 mg/dL (ref 1.6–2.3)

## 2022-07-03 LAB — B12 AND FOLATE PANEL
Folate: >20 ng/mL (ref >3.0)
Vitamin B-12: 440 pg/mL (ref 232–1245)

## 2022-07-03 LAB — VITAMIN D 25 HYDROXY (VIT D DEFICIENCY, FRACTURES): Vit D, 25-Hydroxy: 58.2 ng/mL (ref 30.0–100.0)

## 2022-07-07 DIAGNOSIS — A630 Anogenital (venereal) warts: Secondary | ICD-10-CM | POA: Diagnosis not present

## 2022-07-08 ENCOUNTER — Other Ambulatory Visit: Payer: Self-pay

## 2022-07-08 DIAGNOSIS — Z1321 Encounter for screening for nutritional disorder: Secondary | ICD-10-CM

## 2022-07-08 NOTE — Progress Notes (Unsigned)
Name: Holly Pineda   MRN: [id number]    DOB: 02/10/1981   Date:07/09/2022       Progress Note  Subjective  Chief Complaint  Chief Complaint  Patient presents with   Establish Care   Annual Exam    HPI  Patient presents for annual CPE and establish care  Establish care: her last physical was last year. She has a history of hypothyroidism.   Hypothyroidism/goiter/thyroid nodule:  she sees an endocrinologist, Dr. Barry Brunner. Last seen on 05/06/2022. She is currently on levothyroxine 88 mcg daily. Biopsy of nodule completed 02/19/22 noted no signs of malignancy, consistent with normal thyroid tissue. Will reassess nodule in 1 year (appointment made 02/24/23). Noted L thyroid 2.5 cm nodule.  She sees endo every six weeks for level check, she is trying to get pregnant.   Vitamin d deficiency: Her last vitamin d level was in normal range. 03/04/2022 it was 56. She is currently taking vitamin d supplement .  Diet: oatmeal, fruit, eggs, spinach, romaine, vegetables, she eats a well balanced diet Exercise: 20 min daily walking  Sleep: 6-7 hours, she is taking magnesium and melatonin Last dental exam:within the last six months, maybe August Last eye exam: done a few months ago  [employer] Visit from 07/09/2022 in [facility]  AUDIT-C Score 0      Depression: Phq 9 is  negative    07/09/2022    3:30 PM  Depression screen PHQ 2/9  Decreased Interest 0  Down, Depressed, Hopeless 0  PHQ - 2 Score 0  Altered sleeping 1  Tired, decreased energy 0  Change in appetite 0  Feeling bad or failure about yourself  0  Trouble concentrating 0  Moving slowly or fidgety/restless 0  Suicidal thoughts 0  PHQ-9 Score 1  Difficult doing work/chores Not difficult at all   Hypertension: BP Readings from Last 3 Encounters:  07/09/22 110/72  05/05/22 116/70  04/30/22 110/70   Obesity: Wt Readings from Last 3 Encounters:  07/09/22 102 lb 14.4 oz (46.7 kg)  04/30/22 105  lb (47.6 kg)  07/15/21 105 lb 6.4 oz (47.8 kg)   BMI Readings from Last 3 Encounters:  07/09/22 20.78 kg/m  04/30/22 20.51 kg/m  07/15/21 20.58 kg/m     Vaccines:  HPV: up to at age 51 , ask insurance if age between 31-45  Shingrix: 46-64 yo and ask insurance if covered when patient above 78 yo Pneumonia:  educated and discussed with patient. Flu:  educated and discussed with patient.  Hep C Screening: 09/19/2020 STD testing and prevention (HIV/chl/gon/syphilis): 12/17/2021 Intimate partner violence:none Sexual History :sexually active Menstrual History/LMP/Abnormal Bleeding: [facility]: 07/05/2022 Incontinence Symptoms: none  Breast cancer:  - Last Mammogram: 10/15/2021 - BRCA gene screening: none  Osteoporosis: Discussed high calcium and vitamin D supplementation, weight bearing exercises  Cervical cancer screening: 08/19/2021, abnormal pap( positive for HPV, LSIL) Sees Dr. Blanchard Kelch GYN colposcopy performed.   Skin cancer: Discussed monitoring for atypical lesions  Colorectal cancer: not due no concerns   Lung cancer:   Low Dose CT Chest recommended if Age 54-80 years, 20 pack-year currently smoking OR have quit w/in 15years. Patient does not qualify.   ECG: 06/06/2018  Advanced Care Planning: A voluntary discussion about advance care planning including the explanation and discussion of advance directives.  Discussed health care proxy and Living will, and the patient was able to identify a health care proxy as Levander Campion, Renee Harder.  Patient does not have  a living will at present time. If patient does have living will, I have requested they bring this to the clinic to be scanned in to their chart.  Lipids: Lab Results  Component Value Date   CHOL 188 07/02/2022   CHOL 187 06/13/2021   CHOL 183 06/20/2020   Lab Results  Component Value Date   HDL 66 07/02/2022   HDL 66 06/13/2021   HDL 63 06/20/2020   Lab Results  Component Value Date   LDLCALC 107 (H) 07/02/2022    LDLCALC 106 (H) 06/13/2021   LDLCALC 105 (H) 06/20/2020   Lab Results  Component Value Date   TRIG 80 07/02/2022   TRIG 85 06/13/2021   TRIG 80 06/20/2020   Lab Results  Component Value Date   CHOLHDL 2.8 07/02/2022   CHOLHDL 2.8 06/13/2021   CHOLHDL 2.9 06/20/2020   No results found for: "LDLDIRECT"  Glucose: Glucose  Date Value Ref Range Status  07/08/2022 88 70 - 99 mg/dL Final  07/02/2022 CANCELED mg/dL     Comment:    Test not performed.  Serum was in contact with cells when received which will make the result inaccurate.  Result canceled by the ancillary.   06/13/2021 88 70 - 99 mg/dL Final    Patient Active Problem List   Diagnosis Date Noted   Goiter 02/19/2022   Thyroid nodule 02/19/2022   Hypothyroidism 01/30/2022   Non-seasonal allergic rhinitis due to pollen 07/05/2020   Insomnia 02/13/2019   Anxiety 02/13/2019   Smoker 06/06/2018   Elevated LDL cholesterol level 06/06/2018   Vitamin D deficiency 06/06/2018   Annual physical exam 06/06/2018   Low vitamin D level 11/16/2014   Low vitamin D level 11/16/2014    Past Surgical History:  Procedure Laterality Date   AUGMENTATION MAMMAPLASTY Bilateral 2017   CESAREAN SECTION     CHOLECYSTECTOMY      Family History  Problem Relation Age of Onset   Cancer Mother    Hypertension Father    Dementia Maternal Grandmother    Liver cancer Paternal Grandmother    Breast cancer Maternal Great-grandmother     Social History   Socioeconomic History   Marital status: Divorced    Spouse name: Not on file   Number of children: 2   Years of education: Not on file   Highest education level: Not on file  Occupational History   Not on file  Tobacco Use   Smoking status: Former    Packs/day: 1.00    Years: 20.00    Total pack years: 20.00    Types: Cigarettes    Quit date: 08/18/2018    Years since quitting: 3.8   Smokeless tobacco: Never  Vaping Use   Vaping Use: Never used  Substance and Sexual  Activity   Alcohol use: No   Drug use: No   Sexual activity: Yes    Birth control/protection: None, Condom  Other Topics Concern   Not on file  Social History Narrative   Not on file   Social Determinants of Health   Financial Resource Strain: Low Risk  (07/09/2022)   Overall Financial Resource Strain (CARDIA)    Difficulty of Paying Living Expenses: Not hard at all  Food Insecurity: No Food Insecurity (07/09/2022)   Hunger Vital Sign    Worried About Running Out of Food in the Last Year: Never true    Ran Out of Food in the Last Year: Never true  Transportation Needs: No Transportation Needs (07/09/2022)  PRAPARE - Hydrologist (Medical): No    Lack of Transportation (Non-Medical): No  Physical Activity: Sufficiently Active (07/09/2022)   Exercise Vital Sign    Days of Exercise per Week: 5 days    Minutes of Exercise per Session: 30 min  Stress: No Stress Concern Present (07/09/2022)   [place]    Feeling of Stress : Only a little  Social Connections: Socially Isolated (07/09/2022)   Social Connection and Isolation Panel [NHANES]    Frequency of Communication with Friends and Family: More than three times a week    Frequency of Social Gatherings with Friends and Family: More than three times a week    Attends Religious Services: Never    Marine scientist or Organizations: No    Attends Archivist Meetings: Never    Marital Status: Divorced  Human resources officer Violence: Not At Risk (07/09/2022)   Humiliation, Afraid, Rape, and Kick questionnaire    Fear of Current or Ex-Partner: No    Emotionally Abused: No    Physically Abused: No    Sexually Abused: No     Current Outpatient Medications:    levothyroxine (SYNTHROID) 88 MCG tablet, Take 88 mcg by mouth daily before breakfast., Disp: , Rfl:    Magnesium Oxide 400 MG CAPS, Take 1 tablet by mouth at bedtime., Disp: ,  Rfl:    Melatonin 5 MG CAPS, Take by mouth., Disp: , Rfl:    Multiple Vitamin (MULTIVITAMIN) capsule, Take 1 capsule by mouth daily., Disp: , Rfl:    levothyroxine (SYNTHROID) 50 MCG tablet, TAKE 1 TABLET BY MOUTH EVERY DAY (Patient not taking: Reported on 07/09/2022), Disp: 30 tablet, Rfl: 2  No Known Allergies   ROS  Constitutional: Negative for fever or weight change.  Respiratory: Negative for cough and shortness of breath.   Cardiovascular: Negative for chest pain or palpitations.  Gastrointestinal: Negative for abdominal pain, no bowel changes.  Musculoskeletal: Negative for gait problem or joint swelling.  Skin: Negative for rash.  Neurological: Negative for dizziness or headache.  No other specific complaints in a complete review of systems (except as listed in HPI above).   Objective  Vitals:   07/09/22 1527  BP: 110/72  Pulse: 98  Resp: 18  Temp: 98.6 F (37 C)  TempSrc: Oral  SpO2: 99%  Weight: 102 lb 14.4 oz (46.7 kg)  Height: _0  (1.499 m)    Body mass index is 20.78 kg/m.  Physical Exam Constitutional: Patient appears well-developed and well-nourished. No distress.  HENT: Head: Normocephalic and atraumatic. Ears: B TMs ok, no erythema or effusion; Nose: Nose normal. Mouth/Throat: Oropharynx is clear and moist. No oropharyngeal exudate.  Eyes: Conjunctivae and EOM are normal. Pupils are equal, round, and reactive to light. No scleral icterus.  Neck: Normal range of motion. Neck supple. No JVD present. No thyromegaly present.  Cardiovascular: Normal rate, regular rhythm and normal heart sounds.  No murmur heard. No BLE edema. Pulmonary/Chest: Effort normal and breath sounds normal. No respiratory distress. Abdominal: Soft. Bowel sounds are normal, no distension. There is no tenderness. no masses Breast: no lumps or masses, no nipple discharge or rashes Musculoskeletal: Normal range of motion, no joint effusions. No gross deformities Neurological: he is  alert and oriented to person, place, and time. No cranial nerve deficit. Coordination, balance, strength, speech and gait are normal.  Skin: Skin is warm and dry. No rash noted. No erythema.  Psychiatric: Patient has a normal mood and affect. behavior is normal. Judgment and thought content normal.   Recent Results (from the past 2160 hour(s))  POC Urinalysis Dipstick OB     Status: Abnormal   Collection Time: 04/22/22  4:02 PM  Result Value Ref Range   Color, UA     Clarity, UA     Glucose, UA Negative Negative   Bilirubin, UA Negative    Ketones, UA Negative    Spec Grav, UA 1.010 1.010 - 1.025   Blood, UA Negative    pH, UA 6.0 5.0 - 8.0   POC,PROTEIN,UA Trace Negative, Trace, Small (1+), Moderate (2+), Large (3+), 4+   Urobilinogen, UA 0.2 0.2 or 1.0 E.U./dL   Nitrite, UA Negative    Leukocytes, UA Trace (A) Negative   Appearance     Odor    Estradiol     Status: None   Collection Time: 04/24/22  8:02 AM  Result Value Ref Range   Estradiol 65.7 pg/mL    Comment:                     Adult Female:                       Follicular phase   26.3 -   166.0                       Ovulation phase    85.8 -   498.0                       Luteal phase       43.8 -   211.0                       Postmenopausal     <6.0 -    54.7                     Pregnancy                       1st trimester     215.0 - >4300.0 Roche ECLIA methodology   Progesterone     Status: None   Collection Time: 04/24/22  8:02 AM  Result Value Ref Range   Progesterone 5.0 ng/mL    Comment:                      Follicular phase       0.1 -   0.9                      Luteal phase           1.8 -  23.9                      Ovulation phase        0.1 -  12.0                      Pregnant                         First trimester    11.0 -  44.3  Second trimester   25.4 -  83.3                         Third trimester    58.7 - 214.0                      Postmenopausal         0.0 -   0.1    Follicle stimulating hormone     Status: None   Collection Time: 04/24/22  8:02 AM  Result Value Ref Range   FSH 13.1 mIU/mL    Comment:                     Adult Female:                       Follicular phase      3.5 -  12.5                       Ovulation phase       4.7 -  21.5                       Luteal phase          1.7 -   7.7                       Postmenopausal       25.8 - 134.8   TSH     Status: None   Collection Time: 04/30/22  8:00 AM  Result Value Ref Range   TSH 2.940 0.450 - 4.500 uIU/mL  TSH     Status: None   Collection Time: 06/18/22  8:04 AM  Result Value Ref Range   TSH 2.160 0.450 - 4.500 uIU/mL  Magnesium     Status: None   Collection Time: 07/02/22  7:54 AM  Result Value Ref Range   Magnesium 2.2 1.6 - 2.3 mg/dL  B12 and Folate Panel     Status: None   Collection Time: 07/02/22  7:54 AM  Result Value Ref Range   Vitamin B-12 440 232 - 1,245 pg/mL   Folate >20.0 >3.0 ng/mL    Comment: A serum folate concentration of less than 3.1 ng/mL is considered to represent clinical deficiency.   C-reactive protein     Status: None   Collection Time: 07/02/22  7:54 AM  Result Value Ref Range   CRP <1 0 - 10 mg/L  CMP12+LP+TP+TSH+6AC+CBC/D/Plt     Status: Abnormal   Collection Time: 07/02/22  7:54 AM  Result Value Ref Range   Glucose CANCELED mg/dL    Comment: Test not performed.  Serum was in contact with cells when received which will make the result inaccurate.  Result canceled by the ancillary.    Uric Acid 3.3 2.6 - 6.2 mg/dL    Comment:            Therapeutic target for gout patients: <6.0   BUN 14 6 - 24 mg/dL   Creatinine, Ser 0.83 0.57 - 1.00 mg/dL   eGFR 90 >59 mL/min/1.73   BUN/Creatinine Ratio 17 9 - 23   Sodium 140 134 - 144 mmol/L   Potassium CANCELED mmol/L    Comment: Test not performed.  Serum was in contact with cells when received which will make the result inaccurate.  Result canceled by  the ancillary.    Chloride 102 96 - 106  mmol/L   Calcium 9.5 8.7 - 10.2 mg/dL   Phosphorus CANCELED mg/dL    Comment: Test not performed.  Serum was in contact with cells when received which will make the result inaccurate.  Result canceled by the ancillary.    Total Protein 7.5 6.0 - 8.5 g/dL   Albumin 4.7 3.9 - 4.9 g/dL   Globulin, Total 2.8 1.5 - 4.5 g/dL   Albumin/Globulin Ratio 1.7 1.2 - 2.2   Bilirubin Total 0.3 0.0 - 1.2 mg/dL   Alkaline Phosphatase 53 44 - 121 IU/L   LDH 134 119 - 226 IU/L   AST 14 0 - 40 IU/L   ALT 14 0 - 32 IU/L   GGT 8 0 - 60 IU/L   Iron 56 27 - 159 ug/dL   Cholesterol, Total 188 100 - 199 mg/dL   Triglycerides 80 0 - 149 mg/dL   HDL 66 >39 mg/dL   VLDL Cholesterol Cal 15 5 - 40 mg/dL   LDL Chol Calc (NIH) 107 (H) 0 - 99 mg/dL   Chol/HDL Ratio 2.8 0.0 - 4.4 ratio    Comment:                                   T. Chol/HDL Ratio                                             Men  Women                               1/2 Avg.Risk  3.4    3.3                                   Avg.Risk  5.0    4.4                                2X Avg.Risk  9.6    7.1                                3X Avg.Risk 23.4   11.0    Estimated CHD Risk  < 0.5 0.0 - 1.0 times avg.    Comment: The CHD Risk is based on the T. Chol/HDL ratio. Other factors affect CHD Risk such as hypertension, smoking, diabetes, severe obesity, and family history of premature CHD.    TSH 3.490 0.450 - 4.500 uIU/mL   T4, Total 8.3 4.5 - 12.0 ug/dL   T3 Uptake Ratio 29 24 - 39 %   Free Thyroxine Index 2.4 1.2 - 4.9   WBC 5.9 3.4 - 10.8 x10E3/uL   RBC 4.38 3.77 - 5.28 x10E6/uL   Hemoglobin 13.5 11.1 - 15.9 g/dL   Hematocrit 40.8 34.0 - 46.6 %   MCV 93 79 - 97 fL   MCH 30.8 26.6 - 33.0 pg   MCHC 33.1 31.5 - 35.7 g/dL   RDW 12.6 11.7 - 15.4 %   Platelets 218 150 - 450 x10E3/uL  Neutrophils 59 Not Estab. %   Lymphs 30 Not Estab. %   Monocytes 7 Not Estab. %   Eos 3 Not Estab. %   Basos 1 Not Estab. %   Neutrophils Absolute 3.5 1.4 -  7.0 x10E3/uL   Lymphocytes Absolute 1.8 0.7 - 3.1 x10E3/uL   Monocytes Absolute 0.4 0.1 - 0.9 x10E3/uL   EOS (ABSOLUTE) 0.2 0.0 - 0.4 x10E3/uL   Basophils Absolute 0.1 0.0 - 0.2 x10E3/uL   Immature Granulocytes 0 Not Estab. %   Immature Grans (Abs) 0.0 0.0 - 0.1 x10E3/uL  VITAMIN D 25 Hydroxy (Vit-D Deficiency, Fractures)     Status: None   Collection Time: 07/02/22  7:54 AM  Result Value Ref Range   Vit D, 25-Hydroxy 58.2 30.0 - 100.0 ng/mL    Comment: Vitamin D deficiency has been defined by the Institute of Medicine and an Endocrine Society practice guideline as a level of serum 25-OH vitamin D less than 20 ng/mL (1,2). The Endocrine Society went on to further define vitamin D insufficiency as a level between 21 and 29 ng/mL (2). 1. IOM (Institute of Medicine). 2010. Dietary reference    intakes for calcium and D. [place]: The    [employer]. 2. Holick MF, Binkley [place], Bischoff-Ferrari HA, et al.    Evaluation, treatment, and prevention of vitamin D    deficiency: an Endocrine Society clinical practice    guideline. JCEM. 2011 Jul; 96(7):1911-30.   Hgb A1c w/o eAG     Status: None   Collection Time: 07/02/22  7:54 AM  Result Value Ref Range   Hgb A1c MFr Bld 5.1 4.8 - 5.6 %    Comment:          Prediabetes: 5.7 - 6.4          Diabetes: >6.4          Glycemic control for adults with diabetes: <7.0   Comprehensive metabolic panel     Status: None   Collection Time: 07/08/22  8:00 AM  Result Value Ref Range   Glucose 88 70 - 99 mg/dL   BUN 16 6 - 24 mg/dL   Creatinine, Ser 0.71 0.57 - 1.00 mg/dL   eGFR 108 >59 mL/min/1.73   BUN/Creatinine Ratio 23 9 - 23   Sodium 141 134 - 144 mmol/L   Potassium 4.6 3.5 - 5.2 mmol/L   Chloride 105 96 - 106 mmol/L   CO2 23 20 - 29 mmol/L   Calcium 9.3 8.7 - 10.2 mg/dL   Total Protein 6.9 6.0 - 8.5 g/dL   Albumin 4.7 3.9 - 4.9 g/dL   Globulin, Total 2.2 1.5 - 4.5 g/dL   Albumin/Globulin Ratio 2.1 1.2 - 2.2   Bilirubin  Total <0.2 0.0 - 1.2 mg/dL   Alkaline Phosphatase 49 44 - 121 IU/L   AST 16 0 - 40 IU/L   ALT 15 0 - 32 IU/L      Fall Risk:    07/09/2022    3:30 PM  [place] in the past year? 0  Number falls in past yr: 0  Injury with Fall? 0  Follow up Falls evaluation completed     Functional Status Survey: Is the patient deaf or have difficulty hearing?: No Does the patient have difficulty seeing, even when wearing glasses/contacts?: No Does the patient have difficulty concentrating, remembering, or making decisions?: No Does the patient have difficulty walking or climbing stairs?: No Does the patient have difficulty dressing  or bathing?: No Does the patient have difficulty doing errands alone such as visiting a doctor's office or shopping?: No   Assessment & Plan  1. Annual physical exam -up to date on pap, labs, and mammogram  2. Hypothyroidism, unspecified type Followed by endo 3. Thyroid nodule Followed by endo  4. Goiter Followed by endo  5. Vitamin D deficiency Takes supplementation  6. Encounter to establish care Cpe completed  -USPSTF grade A and B recommendations reviewed with patient; age-appropriate recommendations, preventive care, screening tests, etc discussed and encouraged; healthy living encouraged; see AVS for patient education given to patient -Discussed importance of 150 minutes of physical activity weekly, eat two servings of fish weekly, eat one serving of tree nuts ( cashews, pistachios, pecans, almonds.Marland Kitchen) every other day, eat 6 servings of fruit/vegetables daily and drink plenty of water and avoid sweet beverages.

## 2022-07-09 ENCOUNTER — Other Ambulatory Visit: Payer: Self-pay

## 2022-07-09 ENCOUNTER — Encounter: Payer: Self-pay | Admitting: Nurse Practitioner

## 2022-07-09 ENCOUNTER — Ambulatory Visit (INDEPENDENT_AMBULATORY_CARE_PROVIDER_SITE_OTHER): Payer: BC Managed Care – PPO | Admitting: Nurse Practitioner

## 2022-07-09 VITALS — BP 110/72 | HR 98 | Temp 98.6°F | Resp 18 | Ht 59.0 in | Wt 102.9 lb

## 2022-07-09 DIAGNOSIS — Z7689 Persons encountering health services in other specified circumstances: Secondary | ICD-10-CM

## 2022-07-09 DIAGNOSIS — E049 Nontoxic goiter, unspecified: Secondary | ICD-10-CM

## 2022-07-09 DIAGNOSIS — Z0000 Encounter for general adult medical examination without abnormal findings: Secondary | ICD-10-CM

## 2022-07-09 DIAGNOSIS — E039 Hypothyroidism, unspecified: Secondary | ICD-10-CM | POA: Diagnosis not present

## 2022-07-09 DIAGNOSIS — E041 Nontoxic single thyroid nodule: Secondary | ICD-10-CM

## 2022-07-09 DIAGNOSIS — E559 Vitamin D deficiency, unspecified: Secondary | ICD-10-CM

## 2022-07-09 LAB — COMPREHENSIVE METABOLIC PANEL
ALT: 15 IU/L (ref 0–32)
AST: 16 IU/L (ref 0–40)
Albumin/Globulin Ratio: 2.1 (ref 1.2–2.2)
Albumin: 4.7 g/dL (ref 3.9–4.9)
Alkaline Phosphatase: 49 IU/L (ref 44–121)
BUN/Creatinine Ratio: 23 (ref 9–23)
BUN: 16 mg/dL (ref 6–24)
Bilirubin Total: <0.2 mg/dL (ref 0.0–1.2)
CO2: 23 mmol/L (ref 20–29)
Calcium: 9.3 mg/dL (ref 8.7–10.2)
Chloride: 105 mmol/L (ref 96–106)
Creatinine, Ser: 0.71 mg/dL (ref 0.57–1.00)
Globulin, Total: 2.2 g/dL (ref 1.5–4.5)
Glucose: 88 mg/dL (ref 70–99)
Potassium: 4.6 mmol/L (ref 3.5–5.2)
Sodium: 141 mmol/L (ref 134–144)
Total Protein: 6.9 g/dL (ref 6.0–8.5)
eGFR: 108 mL/min/{1.73_m2} (ref >59)

## 2022-07-13 ENCOUNTER — Encounter: Payer: Self-pay | Admitting: Nurse Practitioner

## 2022-08-19 ENCOUNTER — Ambulatory Visit: Payer: Self-pay

## 2022-08-19 NOTE — Progress Notes (Signed)
Nutrition Consult: 08/19/2022  CC: I think that I Holly Pineda have a leaky gut.  I am trying to go gluten free.  I tested negative for gluten intolerance, but I am hypothyroid and I have a lot of gas. I need to know if all this gas is normal.  Ht: 4'11"  WT: 102 lbs. BMI: 20.77 HX: Hypothyroidism,decreased vitamin D, smoker, thyroid nodule, breast augmentation, c-section, cholecystectomy. Married with 2 children.  One child remains at home.  Activity: Daily exercise regimen for 20 minutes.  Dietary Recall: AM: takes Levothyroxin waits 1 hour and then has a cup of decaf coffee with honey. Breakfast (8:30-10:00 am): Holly Pineda have cup of Old fashioned oats cooked with water, blue berries and some cinnamon.  Drinks cup of green tea with cinnamon and fresh ginger. OR Quinoa with spices and some Tahini.  OR Fried egg cooked in coconut oil and a fruit of berries or banana or pomegrante seeds.  Snack: none Lunch: Salad of spinach, romaine lettuce, tomatoes, sunflower seeds or nuts. OR leftovers such as green beans, zucchini, and sweet potato with sesame seeds. Drinks water. Snack: None Dinner Zucchini squash, green beans, sweet potato, hamberger with cheese.  Water to drink.  Snack:None  Relates that she loves beans in all forms.  Enjoys pinto, black, lima, chili beans.   Loves broccoli, cabbage, Brussels Sprouts, sauerkraut. The beans and the cruciferous vegetables are highly gas forming.  Will determine if this is her issue. If not she Holly Pineda need further investigation by gastroenterologist for other possible digestive malfunctions.  Recommendations: Observe your incidents of gas as related to the intake of vegetables (broccoli, cabbage, especially raw vegetables). Soak your beans when cooking dry beans. When having broccoli, Brussel sprouts, cabbage, cauliflower, or sauerkraut, have only 1 of these and look for the other less gas forming vegetables. Monitor gas and vegetable/fiber intake.  If not relieved we  need to talk.  Goal:  Decrease the amount of gas I am having.    Educational Materials:  Foods and Food Groups.  Follow-up: Follow up with Holly Pineda if gas continues and does not decrease with limiting the number of gas forming foods at a meal.  Holly Zaidyn Claire, RN, RD, LDN

## 2022-08-21 ENCOUNTER — Other Ambulatory Visit: Payer: Self-pay

## 2022-08-21 DIAGNOSIS — E039 Hypothyroidism, unspecified: Secondary | ICD-10-CM

## 2022-08-22 LAB — TSH: TSH: 1.74 u[IU]/mL (ref 0.450–4.500)

## 2022-09-03 ENCOUNTER — Ambulatory Visit: Payer: BC Managed Care – PPO | Admitting: Dermatology

## 2022-09-03 ENCOUNTER — Encounter: Payer: Self-pay | Admitting: Dermatology

## 2022-09-03 VITALS — BP 117/77 | HR 102

## 2022-09-03 DIAGNOSIS — L739 Follicular disorder, unspecified: Secondary | ICD-10-CM

## 2022-09-03 DIAGNOSIS — L820 Inflamed seborrheic keratosis: Secondary | ICD-10-CM

## 2022-09-03 DIAGNOSIS — L821 Other seborrheic keratosis: Secondary | ICD-10-CM

## 2022-09-03 MED ORDER — PERMETHRIN 5 % EX CREA: 1.0000 | TOPICAL_CREAM | Freq: Once | CUTANEOUS | 0 refills | Status: AC

## 2022-09-03 NOTE — Patient Instructions (Addendum)
Start permethrin twice daily for 1 month.  Start Ovace once daily.  Demodex folliculitis   Due to recent changes in healthcare laws, you may see results of your pathology and/or laboratory studies on MyChart before the doctors have had a chance to review them. We understand that in some cases there may be results that are confusing or concerning to you. Please understand that not all results are received at the same time and often the doctors may need to interpret multiple results in order to provide you with the best plan of care or course of treatment. Therefore, we ask that you please give [place] 2 business days to thoroughly review all your results before contacting the office for clarification. Should we see a critical lab result, you will be contacted sooner.   If You Need Anything After Your Visit  If you have any questions or concerns for your doctor, please call our main line at [phone] and press option 4 to reach your doctor's medical assistant. If no one answers, please leave a voicemail as directed and we will return your call as soon as possible. Messages left after 4 pm will be answered the following business day.   You may also send [place] a message via [place]. We typically respond to MyChart messages within 1-2 business days.  For prescription refills, please ask your pharmacy to contact our office. Our fax number is [phone].  If you have an urgent issue when the clinic is closed that cannot wait until the next business day, you can page your doctor at the number below.    Please note that while we do our best to be available for urgent issues outside of office hours, we are not available 24/7.   If you have an urgent issue and are unable to reach [place], you may choose to seek medical care at your doctor's office, retail clinic, urgent care center, or emergency room.  If you have a medical emergency, please immediately call 911 or go to the emergency department.  Pager  Numbers  - Dr. Nehemiah Massed: [phone]  - Dr. Laurence Ferrari: [phone]  - Dr. Nicole Kindred: [phone]  In the event of inclement weather, please call our main line at [phone] for an update on the status of any delays or closures.  Dermatology Medication Tips: Please keep the boxes that topical medications come in in order to help keep track of the instructions about where and how to use these. Pharmacies typically print the medication instructions only on the boxes and not directly on the medication tubes.   If your medication is too expensive, please contact our office at [phone] option 4 or send [place] a message through [place].   We are unable to tell what your co-pay for medications will be in advance as this is different depending on your insurance coverage. However, we may be able to find a substitute medication at lower cost or fill out paperwork to get insurance to cover a needed medication.   If a prior authorization is required to get your medication covered by your insurance company, please allow [place] 1-2 business days to complete this process.  Drug prices often vary depending on where the prescription is filled and some pharmacies may offer cheaper prices.  The website www.goodrx.com contains coupons for medications through different pharmacies. The prices here do not account for what the cost may be with help from insurance (it may be cheaper with your insurance), but the website can give you the price if you  did not use any insurance.  - You can print the associated coupon and take it with your prescription to the pharmacy.  - You may also stop by our office during regular business hours and pick up a GoodRx coupon card.  - If you need your prescription sent electronically to a different pharmacy, notify our office through [facility] or by phone at [phone] option 4.     Si Usted Necesita Algo Despus de Su Visita  Tambin puede enviarnos un mensaje a travs de  Pharmacist, community. Por lo general respondemos a los mensajes de MyChart en el transcurso de 1 a 2 das hbiles.  Para renovar recetas, por favor pida a su farmacia que se ponga en contacto con nuestra oficina. Harland Dingwall de fax es [place] [phone].  Si tiene un asunto urgente cuando la clnica est cerrada y que no puede esperar hasta el siguiente da hbil, puede llamar/localizar a su doctor(a) al nmero que aparece a continuacin.   Por favor, tenga en cuenta que aunque hacemos todo lo posible para estar disponibles para asuntos urgentes fuera del horario de [place], no estamos disponibles las 24 horas del da, los 7 das de la [place].   Si tiene un problema urgente y no puede comunicarse con nosotros, puede optar por buscar atencin mdica  en el consultorio de su doctor(a), en una clnica privada, en un centro de atencin urgente o en una sala de emergencias.  Si tiene Engineering geologist, por favor llame inmediatamente al 911 o vaya a la sala de emergencias.  Nmeros de bper  - Dr. Nehemiah Massed: [phone]  - Dra. Moye: [phone]  - Dra. Nicole Kindred: [phone]  En caso de inclemencias del [place], por favor llame a Johnsie Kindred principal al [phone] para una actualizacin sobre el [place] de cualquier retraso o cierre.  Consejos para la medicacin en dermatologa: Por favor, guarde las cajas en las que vienen los medicamentos de uso tpico para ayudarle a seguir las instrucciones sobre dnde y cmo usarlos. Las farmacias generalmente imprimen las instrucciones del medicamento slo en las cajas y no directamente en los tubos del [place].   Si su medicamento es muy caro, por favor, pngase en contacto con Zigmund Daniel llamando al [phone] y presione la opcin 4 o envenos un mensaje a travs de Pharmacist, community.   No podemos decirle cul ser su copago por los medicamentos por adelantado ya que esto es diferente dependiendo de la cobertura de su seguro. Sin embargo, es posible que  podamos encontrar un medicamento sustituto a Electrical engineer un formulario para que el seguro cubra el medicamento que se considera necesario.   Si se requiere una autorizacin previa para que su compaa de seguros [place] su medicamento, por favor permtanos de 1 a 2 das hbiles para completar este proceso.  Los precios de los medicamentos varan con frecuencia dependiendo del Environmental consultant de dnde se surte la receta y alguna farmacias pueden ofrecer precios ms baratos.  El sitio web www.goodrx.com tiene cupones para medicamentos de Airline pilot. Los precios aqu no tienen en cuenta lo que podra costar con la ayuda del seguro (puede ser ms barato con su seguro), pero el sitio web puede darle el precio si no utiliz Research scientist (physical sciences).  - Puede imprimir el cupn correspondiente y llevarlo con su receta a la farmacia.  - Tambin puede pasar por nuestra oficina durante el horario de atencin regular y Charity fundraiser una tarjeta de cupones de GoodRx.  - Si necesita que su receta se  enve electrnicamente a una farmacia diferente, informe a nuestra oficina a travs de MyChart de [place] o por telfono llamando al [phone] y presione la opcin 4.  

## 2022-09-03 NOTE — Progress Notes (Signed)
   Follow-Up Visit   Subjective  Holly Pineda is a 42 y.o. female who presents for the following: Rash (Patient here today for rash at face that feels like sandpaper. Started during the summer and she thought it was due to dry skin from thyroid issues. She had switched sunscreen and make up but has since gone back to her regular products with no improvement in rash. Thyroid numbers were WNL. ).  Patient also would like to know what she can use for dry skin at legs. Patient is scheduled for full skin at end of the month but has a few moles she would like checked.   The following portions of the chart were reviewed this encounter and updated as appropriate:   Tobacco  Allergies  Meds  Problems  Med Hx  Surg Hx  Fam Hx      Review of Systems:  No other skin or systemic complaints except as noted in HPI or Assessment and Plan.  Objective  Well appearing patient in no apparent distress; mood and affect are within normal limits.  A focused examination was performed including face, neck, chest and back. Relevant physical exam findings are noted in the Assessment and Plan.  face Erythematous plaques with spiny follicles  right chest Erythematous stuck-on, waxy papule or plaque    Assessment & Plan  Folliculitis face  KOH showed demodex  Start permethrin 5 % cream thin layer to face twice daily for 1 month.   Reassured benign  permethrin (ELIMITE) 5 % cream - face Apply 1 Application topically once for 1 dose.  Inflamed seborrheic keratosis right chest  Symptomatic, irritating, patient would like treated.  Will plan numbing and cautery on follow up.  Melanocytic Nevi - Tan-brown and/or pink-flesh-colored symmetric macules and papules - Benign appearing on exam today - Observation - Call clinic for new or changing moles - Recommend daily use of broad spectrum spf 30+ sunscreen to sun-exposed areas.   Seborrheic Keratoses - Stuck-on, waxy, tan-brown  papules and/or plaques  - Benign-appearing - Discussed benign etiology and prognosis. - Observe - Call for any changes  Return for TBSE, as scheduled, Hx Dysplastic Nevi, Hx BCC.  Graciella Belton, RMA, am acting as scribe for Forest Gleason, MD .  Documentation: I have reviewed the above documentation for accuracy and completeness, and I agree with the above.  Forest Gleason, MD

## 2022-09-15 ENCOUNTER — Encounter: Payer: Self-pay | Admitting: Dermatology

## 2022-09-16 ENCOUNTER — Other Ambulatory Visit: Payer: Self-pay | Admitting: Obstetrics and Gynecology

## 2022-09-16 DIAGNOSIS — Z1231 Encounter for screening mammogram for malignant neoplasm of breast: Secondary | ICD-10-CM

## 2022-09-17 ENCOUNTER — Ambulatory Visit: Payer: BC Managed Care – PPO | Admitting: Family Medicine

## 2022-09-24 ENCOUNTER — Encounter: Payer: BC Managed Care – PPO | Admitting: Dermatology

## 2022-09-29 DIAGNOSIS — Z01419 Encounter for gynecological examination (general) (routine) without abnormal findings: Secondary | ICD-10-CM | POA: Diagnosis not present

## 2022-09-29 DIAGNOSIS — Z1231 Encounter for screening mammogram for malignant neoplasm of breast: Secondary | ICD-10-CM | POA: Diagnosis not present

## 2022-09-29 DIAGNOSIS — N898 Other specified noninflammatory disorders of vagina: Secondary | ICD-10-CM | POA: Diagnosis not present

## 2022-09-29 DIAGNOSIS — Z1331 Encounter for screening for depression: Secondary | ICD-10-CM | POA: Diagnosis not present

## 2022-09-30 ENCOUNTER — Ambulatory Visit: Payer: BC Managed Care – PPO | Admitting: Dermatology

## 2022-09-30 ENCOUNTER — Encounter: Payer: Self-pay | Admitting: Dermatology

## 2022-09-30 VITALS — BP 96/60 | HR 77

## 2022-09-30 DIAGNOSIS — Z85828 Personal history of other malignant neoplasm of skin: Secondary | ICD-10-CM | POA: Diagnosis not present

## 2022-09-30 DIAGNOSIS — L578 Other skin changes due to chronic exposure to nonionizing radiation: Secondary | ICD-10-CM

## 2022-09-30 DIAGNOSIS — L814 Other melanin hyperpigmentation: Secondary | ICD-10-CM | POA: Diagnosis not present

## 2022-09-30 DIAGNOSIS — L820 Inflamed seborrheic keratosis: Secondary | ICD-10-CM

## 2022-09-30 DIAGNOSIS — L821 Other seborrheic keratosis: Secondary | ICD-10-CM

## 2022-09-30 DIAGNOSIS — Z1283 Encounter for screening for malignant neoplasm of skin: Secondary | ICD-10-CM

## 2022-09-30 DIAGNOSIS — D229 Melanocytic nevi, unspecified: Secondary | ICD-10-CM

## 2022-09-30 MED ORDER — IVERMECTIN 3 MG PO TABS: 200.0000 ug/kg | ORAL_TABLET | Freq: Once | ORAL | 0 refills | Status: DC

## 2022-09-30 NOTE — Patient Instructions (Signed)
Due to recent changes in healthcare laws, you may see results of your pathology and/or laboratory studies on MyChart before the doctors have had a chance to review them. We understand that in some cases there may be results that are confusing or concerning to you. Please understand that not all results are received at the same time and often the doctors may need to interpret multiple results in order to provide you with the best plan of care or course of treatment. Therefore, we ask that you please give us 2 business days to thoroughly review all your results before contacting the office for clarification. Should we see a critical lab result, you will be contacted sooner.   If You Need Anything After Your Visit  If you have any questions or concerns for your doctor, please call our main line at [phone] and press option 4 to reach your doctor's medical assistant. If no one answers, please leave a voicemail as directed and we will return your call as soon as possible. Messages left after 4 pm will be answered the following business day.   You may also send us a message via MyChart. We typically respond to MyChart messages within 1-2 business days.  For prescription refills, please ask your pharmacy to contact our office. Our fax number is [phone].  If you have an urgent issue when the clinic is closed that cannot wait until the next business day, you can page your doctor at the number below.    Please note that while we do our best to be available for urgent issues outside of office hours, we are not available 24/7.   If you have an urgent issue and are unable to reach us, you may choose to seek medical care at your doctor's office, retail clinic, urgent care center, or emergency room.  If you have a medical emergency, please immediately call 911 or go to the emergency department.  Pager Numbers  - Dr. Kowalski: [phone]  - Dr. Moye: [phone]  - Dr. Stewart:  [phone]  In the event of inclement weather, please call our main line at [phone] for an update on the status of any delays or closures.  Dermatology Medication Tips: Please keep the boxes that topical medications come in in order to help keep track of the instructions about where and how to use these. Pharmacies typically print the medication instructions only on the boxes and not directly on the medication tubes.   If your medication is too expensive, please contact our office at [phone] option 4 or send us a message through MyChart.   We are unable to tell what your co-pay for medications will be in advance as this is different depending on your insurance coverage. However, we may be able to find a substitute medication at lower cost or fill out paperwork to get insurance to cover a needed medication.   If a prior authorization is required to get your medication covered by your insurance company, please allow us 1-2 business days to complete this process.  Drug prices often vary depending on where the prescription is filled and some pharmacies may offer cheaper prices.  The website www.goodrx.com contains coupons for medications through different pharmacies. The prices here do not account for what the cost may be with help from insurance (it may be cheaper with your insurance), but the website can give you the price if you did not use any insurance.  - You can print the associated coupon and take it with   your prescription to the pharmacy.  - You may also stop by our office during regular business hours and pick up a GoodRx coupon card.  - If you need your prescription sent electronically to a different pharmacy, notify our office through [place] MyChart or by phone at [phone] option 4.     Si Usted Necesita Algo Despus de Su Visita  Tambin puede enviarnos un mensaje a travs de MyChart. Por lo general respondemos a los mensajes de MyChart en el transcurso de 1 a 2  das hbiles.  Para renovar recetas, por favor pida a su farmacia que se ponga en contacto con nuestra oficina. Nuestro nmero de fax es el [phone].  Si tiene un asunto urgente cuando la clnica est cerrada y que no puede esperar hasta el siguiente da hbil, puede llamar/localizar a su doctor(a) al nmero que aparece a continuacin.   Por favor, tenga en cuenta que aunque hacemos todo lo posible para estar disponibles para asuntos urgentes fuera del horario de oficina, no estamos disponibles las 24 horas del da, los 7 das de la semana.   Si tiene un problema urgente y no puede comunicarse con nosotros, puede optar por buscar atencin mdica  en el consultorio de su doctor(a), en una clnica privada, en un centro de atencin urgente o en una sala de emergencias.  Si tiene una emergencia mdica, por favor llame inmediatamente al 911 o vaya a la sala de emergencias.  Nmeros de bper  - Dr. Kowalski: [phone]  - Dra. Moye: [phone]  - Dra. Stewart: [phone]  En caso de inclemencias del tiempo, por favor llame a nuestra lnea principal al [phone] para una actualizacin sobre el estado de cualquier retraso o cierre.  Consejos para la medicacin en dermatologa: Por favor, guarde las cajas en las que vienen los medicamentos de uso tpico para ayudarle a seguir las instrucciones sobre dnde y cmo usarlos. Las farmacias generalmente imprimen las instrucciones del medicamento slo en las cajas y no directamente en los tubos del medicamento.   Si su medicamento es muy caro, por favor, pngase en contacto con nuestra oficina llamando al [phone] y presione la opcin 4 o envenos un mensaje a travs de MyChart.   No podemos decirle cul ser su copago por los medicamentos por adelantado ya que esto es diferente dependiendo de la cobertura de su seguro. Sin embargo, es posible que podamos encontrar un medicamento sustituto a menor costo o llenar un formulario para que el  seguro cubra el medicamento que se considera necesario.   Si se requiere una autorizacin previa para que su compaa de seguros cubra su medicamento, por favor permtanos de 1 a 2 das hbiles para completar este proceso.  Los precios de los medicamentos varan con frecuencia dependiendo del lugar de dnde se surte la receta y alguna farmacias pueden ofrecer precios ms baratos.  El sitio web www.goodrx.com tiene cupones para medicamentos de diferentes farmacias. Los precios aqu no tienen en cuenta lo que podra costar con la ayuda del seguro (puede ser ms barato con su seguro), pero el sitio web puede darle el precio si no utiliz ningn seguro.  - Puede imprimir el cupn correspondiente y llevarlo con su receta a la farmacia.  - Tambin puede pasar por nuestra oficina durante el horario de atencin regular y recoger una tarjeta de cupones de GoodRx.  - Si necesita que su receta se enve electrnicamente a una farmacia diferente, informe a nuestra oficina a travs de MyChart de [place]   o por telfono llamando al [phone] y presione la opcin 4.  

## 2022-09-30 NOTE — Progress Notes (Addendum)
Follow-Up Visit   Subjective  Holly Pineda is a 42 y.o. female who presents for the following: Annual Exam (Hx BCC, dysplastic nevi ). The patient presents for Total-Body Skin Exam (TBSE) for skin cancer screening and mole check.  The patient has spots, moles and lesions to be evaluated, some may be new or changing and the patient has concerns that these could be cancer.   The following portions of the chart were reviewed this encounter and updated as appropriate:   Tobacco  Allergies  Meds  Problems  Med Hx  Surg Hx  Fam Hx      Review of Systems:  No other skin or systemic complaints except as noted in HPI or Assessment and Plan.  Objective  Well appearing patient in no apparent distress; mood and affect are within normal limits.  A full examination was performed including scalp, head, eyes, ears, nose, lips, neck, chest, axillae, abdomen, back, buttocks, bilateral upper extremities, bilateral lower extremities, hands, feet, fingers, toes, fingernails, and toenails. All findings within normal limits unless otherwise noted below.  Right chest, upper back, right thigh (3) Erythematous stuck-on, waxy papule or plaque    Assessment & Plan  Inflamed seborrheic keratosis (3) Right chest, upper back, right thigh  Symptomatic, irritating, patient would like treated.  Benign-appearing.  Call clinic for new or changing lesions.   Prior to procedure, discussed risks of blister formation, small wound, skin dyspigmentation, or rare scar following treatment. Recommend Vaseline ointment to treated areas while healing.    Destruction of lesion - Right chest, upper back, right thigh  Destruction method: electrodesiccation and curettage   Destruction method comment:  Electrocautery only Informed consent: discussed and consent obtained   Timeout:  patient name, date of birth, surgical site, and procedure verified Anesthesia: the lesion was anesthetized in a standard  fashion   Anesthetic:  1% lidocaine w/ epinephrine 1-100,000 buffered w/ 8.4% NaHCO3 Hemostasis achieved with:  electrodesiccation Outcome: patient tolerated procedure well with no complications   Post-procedure details: sterile dressing applied and wound care instructions given   Dressing type: petrolatum     Lentigines - Scattered tan macules - Due to sun exposure - Benign-appearing, observe - Recommend daily broad spectrum sunscreen SPF 30+ to sun-exposed areas, reapply every 2 hours as needed. - Call for any changes  Seborrheic Keratoses - Stuck-on, waxy, tan-brown papules and/or plaques  - Benign-appearing - Discussed benign etiology and prognosis. - Observe - Call for any changes  Melanocytic Nevi - Tan-brown and/or pink-flesh-colored symmetric macules and papules - Benign appearing on exam today - Observation - Call clinic for new or changing moles - Recommend daily use of broad spectrum spf 30+ sunscreen to sun-exposed areas.   Hemangiomas - Red papules - Discussed benign nature - Observe - Call for any changes  Actinic Damage - Chronic condition, secondary to cumulative UV/sun exposure - diffuse scaly erythematous macules with underlying dyspigmentation - Recommend daily broad spectrum sunscreen SPF 30+ to sun-exposed areas, reapply every 2 hours as needed.  - Staying in the shade or wearing long sleeves, sun glasses (UVA+UVB protection) and wide brim hats (4-inch brim around the entire circumference of the hat) are also recommended for sun protection.  - Call for new or changing lesions.  History of Basal Cell Carcinoma of the Skin - No evidence of recurrence today - Recommend regular full body skin exams - Recommend daily broad spectrum sunscreen SPF 30+ to sun-exposed areas, reapply every 2 hours as needed.  -  Call if any new or changing lesions are noted between office visits  History of Dysplastic Nevi - No evidence of recurrence today - Recommend  regular full body skin exams - Recommend daily broad spectrum sunscreen SPF 30+ to sun-exposed areas, reapply every 2 hours as needed.  - Call if any new or changing lesions are noted between office visits  Demodex folliculitis Exam: papules and white keratotic plugs at face Previous KOH + for demodex  Treatment Plan: Take ivermectin 3 mg tablets 3 tablets once Can continue topical permethrin twice a day   Skin cancer screening performed today.  Return in about 1 year (around 10/01/2023) for TBSE, Hx Dysplastic Nevi, Hx BCC.  Luther Redo, CMA, am acting as scribe for Forest Gleason, MD .   Documentation: I have reviewed the above documentation for accuracy and completeness, and I agree with the above.  Forest Gleason, MD

## 2022-10-07 ENCOUNTER — Encounter: Payer: Self-pay | Admitting: Adult Health

## 2022-10-07 ENCOUNTER — Ambulatory Visit (INDEPENDENT_AMBULATORY_CARE_PROVIDER_SITE_OTHER): Payer: Self-pay | Admitting: Adult Health

## 2022-10-07 VITALS — BP 110/64 | HR 107 | Temp 98.3°F

## 2022-10-07 DIAGNOSIS — Z8669 Personal history of other diseases of the nervous system and sense organs: Secondary | ICD-10-CM

## 2022-10-07 DIAGNOSIS — Z7189 Other specified counseling: Secondary | ICD-10-CM

## 2022-10-07 NOTE — Progress Notes (Signed)
[facility] [address], [place] [address]   Office Visit Note  Patient Name: Holly Pineda Date of Birth [id number]  Medical Record number [id number]  Date of Service: 10/07/2022  Chief Complaint  Patient presents with   Personal     Would like to discuss some things      HPI Pt is here for acute visit.  She continues to have issues sleeping.  She has seen psych, she has tried Seroquel and Trazadone with some help.  It has been a year since she has been off of them.  She takes melatonin and is seeing a fertility coach to maximize her chances of conceiving and to help with her thyroid.  She also reports her grandmother passed away 25 years ago from colon cancer.  She didn't know before that it was due to colon cancer, and two other family members had colon cancer in the past.  She wants to talk about screening.    Current Medication:  Outpatient Encounter Medications as of 10/07/2022  Medication Sig   doxycycline (VIBRA-TABS) 100 MG tablet Take 100 mg by mouth 2 (two) times daily.   levothyroxine (SYNTHROID) 88 MCG tablet Take 88 mcg by mouth daily before breakfast.   Melatonin 5 MG CAPS Take by mouth.   Multiple Vitamin (MULTIVITAMIN) capsule Take 1 capsule by mouth daily.   Magnesium Oxide 400 MG CAPS Take 1 tablet by mouth at bedtime. (Patient not taking: Reported on 10/07/2022)   No facility-administered encounter medications on file as of 10/07/2022.      Medical History: Past Medical History:  Diagnosis Date   Allergy    Annual physical exam 06/06/2018   Anxiety 02/13/2019   Atypical mole 06/05/2015   right lower back/mild, right lateral abdomen/mild   Atypical mole 08/20/2015   right upper back paraspinal/excision   Atypical mole 08/27/2015   right superior buttock/excision   Atypical mole 02/24/2016   right inframammary/mild   Atypical mole 10/21/2017   left lower back/moderate   Basal cell carcinoma 09/04/2013   left sternum mid chest    Goiter 02/19/2022     Vital Signs: BP 110/64   Pulse (!) 107   Temp 98.3 F (36.8 C) (Tympanic)   SpO2 97%    Review of Systems  Constitutional:  Negative for chills, fatigue and fever.  Neurological:  Negative for dizziness, light-headedness and numbness.  Psychiatric/Behavioral:  Positive for sleep disturbance. Negative for agitation. The patient is not nervous/anxious.     Physical Exam Vitals and nursing note reviewed.  Constitutional:      Appearance: Normal appearance.  HENT:     Head: Normocephalic.  Neurological:     Mental Status: She is alert and oriented to person, place, and time.  Psychiatric:        Mood and Affect: Mood normal.        Behavior: Behavior normal.        Thought Content: Thought content normal.        Judgment: Judgment normal.    Assessment/Plan: 1. History of sleep disturbance Encouraged patient to take Melatonin 2-3 hours before sleeping, and practice good sleep hygiene; no screen 2 hours before bed, and continue with no caffeine. Exercise regularly.   2. Counseling and coordination of care Labs ordered patient will get drawn at [facility] site with other labs from [facility].  Results will be available in mychart.  - CBC w/Diff; Future - Lipid Panel With LDL/HDL Ratio; Future  General Counseling: kennetha ludwig understanding of the findings of todays visit and agrees with plan of treatment. I have discussed any further diagnostic evaluation that may be needed or ordered today. We also reviewed her medications today. she has been encouraged to call the office with any questions or concerns that should arise related to todays visit.   Orders Placed This Encounter  Procedures   CBC w/Diff   Lipid Panel With LDL/HDL Ratio    No orders of the defined types were placed in this encounter.   Time spent:20 Minutes    Kendell Bane AGNP-C Nurse Practitioner

## 2022-10-08 ENCOUNTER — Other Ambulatory Visit: Payer: Self-pay | Admitting: Adult Health

## 2022-10-08 ENCOUNTER — Encounter: Payer: Self-pay | Admitting: Adult Health

## 2022-10-08 ENCOUNTER — Encounter: Payer: Self-pay | Admitting: Dermatology

## 2022-10-08 ENCOUNTER — Other Ambulatory Visit: Payer: Self-pay

## 2022-10-08 DIAGNOSIS — N979 Female infertility, unspecified: Secondary | ICD-10-CM | POA: Diagnosis not present

## 2022-10-09 ENCOUNTER — Other Ambulatory Visit: Payer: Self-pay | Admitting: Adult Health

## 2022-10-09 DIAGNOSIS — Z029 Encounter for administrative examinations, unspecified: Secondary | ICD-10-CM

## 2022-10-09 LAB — LIPID PANEL WITH LDL/HDL RATIO
Cholesterol, Total: 210 mg/dL — ABNORMAL HIGH (ref 100–199)
HDL: 74 mg/dL (ref >39)
LDL Chol Calc (NIH): 128 mg/dL — ABNORMAL HIGH (ref 0–99)
LDL/HDL Ratio: 1.7 ratio (ref 0.0–3.2)
Triglycerides: 47 mg/dL (ref 0–149)
VLDL Cholesterol Cal: 8 mg/dL (ref 5–40)

## 2022-10-09 LAB — CBC WITH DIFFERENTIAL/PLATELET
Basophils Absolute: 0 10*3/uL (ref 0.0–0.2)
Basos: 1 %
EOS (ABSOLUTE): 0.1 10*3/uL (ref 0.0–0.4)
Eos: 4 %
Hematocrit: 34.5 % (ref 34.0–46.6)
Hemoglobin: 11.9 g/dL (ref 11.1–15.9)
Immature Grans (Abs): 0 10*3/uL (ref 0.0–0.1)
Immature Granulocytes: 0 %
Lymphocytes Absolute: 1.5 10*3/uL (ref 0.7–3.1)
Lymphs: 40 %
MCH: 31.8 pg (ref 26.6–33.0)
MCHC: 34.5 g/dL (ref 31.5–35.7)
MCV: 92 fL (ref 79–97)
Monocytes Absolute: 0.3 10*3/uL (ref 0.1–0.9)
Monocytes: 8 %
Neutrophils Absolute: 1.7 10*3/uL (ref 1.4–7.0)
Neutrophils: 47 %
Platelets: 199 10*3/uL (ref 150–450)
RBC: 3.74 x10E6/uL — ABNORMAL LOW (ref 3.77–5.28)
RDW: 11.6 % — ABNORMAL LOW (ref 11.7–15.4)
WBC: 3.7 10*3/uL (ref 3.4–10.8)

## 2022-10-09 NOTE — Progress Notes (Signed)
Ordered more labs per patient request.

## 2022-10-14 ENCOUNTER — Other Ambulatory Visit: Payer: Self-pay

## 2022-10-14 DIAGNOSIS — Z029 Encounter for administrative examinations, unspecified: Secondary | ICD-10-CM

## 2022-10-16 LAB — COMPREHENSIVE METABOLIC PANEL
ALT: 13 IU/L (ref 0–32)
AST: 15 IU/L (ref 0–40)
Albumin/Globulin Ratio: 2.1 (ref 1.2–2.2)
Albumin: 4.5 g/dL (ref 3.9–4.9)
Alkaline Phosphatase: 55 IU/L (ref 44–121)
BUN/Creatinine Ratio: 23 (ref 9–23)
BUN: 18 mg/dL (ref 6–24)
Bilirubin Total: 0.2 mg/dL (ref 0.0–1.2)
CO2: 22 mmol/L (ref 20–29)
Calcium: 9.7 mg/dL (ref 8.7–10.2)
Chloride: 106 mmol/L (ref 96–106)
Creatinine, Ser: 0.8 mg/dL (ref 0.57–1.00)
Globulin, Total: 2.1 g/dL (ref 1.5–4.5)
Glucose: 88 mg/dL (ref 70–99)
Potassium: 4.4 mmol/L (ref 3.5–5.2)
Sodium: 142 mmol/L (ref 134–144)
Total Protein: 6.6 g/dL (ref 6.0–8.5)
eGFR: 94 mL/min/{1.73_m2} (ref >59)

## 2022-10-16 LAB — IRON AND TIBC
Iron Saturation: 28 % (ref 15–55)
Iron: 90 ug/dL (ref 27–159)
Total Iron Binding Capacity: 324 ug/dL (ref 250–450)
UIBC: 234 ug/dL (ref 131–425)

## 2022-10-16 LAB — FERRITIN: Ferritin: 24 ng/mL (ref 15–150)

## 2022-10-20 ENCOUNTER — Ambulatory Visit
Admission: RE | Admit: 2022-10-20 | Discharge: 2022-10-20 | Disposition: A | Discharge status: Home / Self Care | Payer: BC Managed Care – PPO | Source: Ambulatory Visit | Attending: Obstetrics and Gynecology | Admitting: Obstetrics and Gynecology

## 2022-10-20 DIAGNOSIS — Z1231 Encounter for screening mammogram for malignant neoplasm of breast: Secondary | ICD-10-CM | POA: Insufficient documentation

## 2022-10-21 NOTE — Telephone Encounter (Signed)
Recommend repeat evaluation in clinic if she is concerned this may not be cleared up. I really can't tell what is going on without seeing her skin unfortunately. Thank you!

## 2022-10-22 ENCOUNTER — Other Ambulatory Visit: Payer: Self-pay

## 2022-10-22 ENCOUNTER — Ambulatory Visit: Payer: BC Managed Care – PPO | Admitting: Dermatology

## 2022-10-22 MED ORDER — IVERMECTIN 3 MG PO TABS: 200.0000 ug/kg | ORAL_TABLET | Freq: Once | ORAL | 0 refills | Status: AC

## 2022-10-22 NOTE — Telephone Encounter (Signed)
Ok to send 1 refill ivermectin. Thank you!

## 2022-11-02 DIAGNOSIS — Z3169 Encounter for other general counseling and advice on procreation: Secondary | ICD-10-CM | POA: Diagnosis not present

## 2022-11-02 DIAGNOSIS — R87810 Cervical high risk human papillomavirus (HPV) DNA test positive: Secondary | ICD-10-CM | POA: Diagnosis not present

## 2022-11-02 DIAGNOSIS — B977 Papillomavirus as the cause of diseases classified elsewhere: Secondary | ICD-10-CM | POA: Diagnosis not present

## 2022-11-02 DIAGNOSIS — N72 Inflammatory disease of cervix uteri: Secondary | ICD-10-CM | POA: Diagnosis not present

## 2022-11-09 ENCOUNTER — Ambulatory Visit (INDEPENDENT_AMBULATORY_CARE_PROVIDER_SITE_OTHER): Payer: Self-pay | Admitting: Physician Assistant

## 2022-11-09 VITALS — BP 108/70 | HR 100 | Temp 98.4°F | Resp 16

## 2022-11-09 DIAGNOSIS — S30860A Insect bite (nonvenomous) of lower back and pelvis, initial encounter: Secondary | ICD-10-CM

## 2022-11-09 DIAGNOSIS — W57XXXA Bitten or stung by nonvenomous insect and other nonvenomous arthropods, initial encounter: Secondary | ICD-10-CM

## 2022-11-09 NOTE — Progress Notes (Signed)
[facility] [address] [place], [place] [address]   Office Visit Note  Patient Name: Holly Pineda Date of Birth [id number]  Medical Record number [id number]  Date of Service: 11/09/2022  Chief Complaint  Patient presents with   Insect Bite     42 y/o F presents to the clinic for c/o an insect bite on the lowe back x 1 day. Noticed slight painful while applying soap in the shower. Denies any purulent drainage on the underwear. Yesterday she cleaned it with hydrogen peroxide and applied Neosporin ointment. Admits to feeling and looking better today.       Current Medication:  Outpatient Encounter Medications as of 11/09/2022  Medication Sig   levothyroxine (SYNTHROID) 88 MCG tablet Take 88 mcg by mouth daily before breakfast.   Magnesium Oxide 400 MG CAPS Take 1 tablet by mouth at bedtime. (Patient not taking: Reported on 10/07/2022)   Melatonin 5 MG CAPS Take by mouth.   Multiple Vitamin (MULTIVITAMIN) capsule Take 1 capsule by mouth daily.   No facility-administered encounter medications on file as of 11/09/2022.      Medical History: Past Medical History:  Diagnosis Date   Allergy    Annual physical exam 06/06/2018   Anxiety 02/13/2019   Atypical mole 06/05/2015   right lower back/mild, right lateral abdomen/mild   Atypical mole 08/20/2015   right upper back paraspinal/excision   Atypical mole 08/27/2015   right superior buttock/excision   Atypical mole 02/24/2016   right inframammary/mild   Atypical mole 10/21/2017   left lower back/moderate   Basal cell carcinoma 09/04/2013   left sternum mid chest   Goiter 02/19/2022     Vital Signs: BP 108/70 (BP Location: Left Arm, Patient Position: Sitting, Cuff Size: Normal)   Pulse 100   Temp 98.4 F (36.9 C) (Tympanic)   Resp 16   LMP 10/06/2022   SpO2 99%    Review of Systems  Constitutional: Negative.   Genitourinary: Negative.   Skin:  Positive for color change and rash. Negative for  pallor and wound.    Physical Exam Exam conducted with a chaperone present.  Constitutional:      Appearance: Normal appearance.  HENT:     Head: Normocephalic and atraumatic.     Right Ear: External ear normal.     Left Ear: External ear normal.  Eyes:     Extraocular Movements: Extraocular movements intact.  Skin:    General: Skin is warm and dry.     Comments: Today's skin examination was chaperoned by Bonne Dolores, CMA. Two small flat puncture wounds with localized erythematous border. No induration. No swelling. NO purulent drainage. No red streaks. Not warm to touch.   Neurological:     Mental Status: She is alert and oriented to person, place, and time.  Psychiatric:        Mood and Affect: Mood normal.        Behavior: Behavior normal.       Assessment/Plan:  1. Insect bite of lower back, initial encounter  Continue to apply warm moist compresses and apply otc antibiotic ointment as instructed. Apply otc hydrocortisone cream for onset of itching.  Continue to watch for worsening symptoms including red streaks, swelling, tenderness, induration, chills, fever etc. Then RTC for re-evaluation. Pt verbalized understanding and in agreement.    General Counseling: christell calderone understanding of the findings of todays visit and agrees with plan of treatment. I have discussed any further diagnostic evaluation that may  be needed or ordered today. We also reviewed her medications today. she has been encouraged to call the office with any questions or concerns that should arise related to todays visit.    Time spent:20 Minutes    Gilberto Better, [place] Physician Assistant

## 2022-11-10 ENCOUNTER — Other Ambulatory Visit: Payer: Self-pay

## 2022-11-10 DIAGNOSIS — Z3169 Encounter for other general counseling and advice on procreation: Secondary | ICD-10-CM

## 2022-11-14 LAB — FOLLICLE STIMULATING HORMONE: FSH: 17 m[IU]/mL

## 2022-11-14 LAB — ESTRADIOL: Estradiol: 27.2 pg/mL

## 2022-11-14 LAB — ANTI MULLERIAN HORMONE: ANTI-MULLERIAN HORMONE (AMH): 0.334 ng/mL

## 2022-11-17 ENCOUNTER — Encounter: Payer: Self-pay | Admitting: Dermatology

## 2022-11-17 NOTE — Telephone Encounter (Signed)
If she has felt fine with the pills, we can refill the pills to take once a week (3 tablets of ivermectin once per week) for the next 4 weeks and schedule her for recheck at that time. Thank you!

## 2022-11-19 ENCOUNTER — Other Ambulatory Visit: Payer: Self-pay

## 2022-11-19 MED ORDER — IVERMECTIN 3 MG PO TABS: 200.0000 ug/kg | ORAL_TABLET | ORAL | 0 refills | Status: DC

## 2022-11-19 NOTE — Telephone Encounter (Signed)
Yes. It is safe to take once a week. Please send in. Thank you!

## 2022-11-26 ENCOUNTER — Other Ambulatory Visit (INDEPENDENT_AMBULATORY_CARE_PROVIDER_SITE_OTHER): Payer: Self-pay | Admitting: Adult Health

## 2022-11-26 DIAGNOSIS — Z3169 Encounter for other general counseling and advice on procreation: Secondary | ICD-10-CM

## 2022-11-26 NOTE — Progress Notes (Signed)
Labs drawn per patients orders.    Johnna Acosta DNP, NP-C Nurse Practitioner [facility]

## 2022-11-28 LAB — ESTRADIOL: Estradiol: 132 pg/mL

## 2022-11-28 LAB — PROGESTERONE: Progesterone: 13.1 ng/mL

## 2022-12-04 ENCOUNTER — Other Ambulatory Visit (INDEPENDENT_AMBULATORY_CARE_PROVIDER_SITE_OTHER): Payer: Self-pay

## 2022-12-04 ENCOUNTER — Encounter: Payer: Self-pay | Admitting: Adult Health

## 2022-12-04 DIAGNOSIS — Z029 Encounter for administrative examinations, unspecified: Secondary | ICD-10-CM

## 2022-12-04 NOTE — Progress Notes (Signed)
Patient here for blood draw.  Recheck thyroid.  Labs drawn, spun, and left for [facility] pick up.  Johnna Acosta DNP, NP-C Nurse Practitioner [facility]

## 2022-12-09 ENCOUNTER — Other Ambulatory Visit: Payer: Self-pay

## 2022-12-09 DIAGNOSIS — Z029 Encounter for administrative examinations, unspecified: Secondary | ICD-10-CM

## 2022-12-10 ENCOUNTER — Other Ambulatory Visit: Payer: Self-pay

## 2022-12-10 DIAGNOSIS — Z029 Encounter for administrative examinations, unspecified: Secondary | ICD-10-CM

## 2022-12-10 NOTE — Progress Notes (Signed)
Labs drawn and labeled.

## 2022-12-12 LAB — TSH: TSH: 3 u[IU]/mL (ref 0.450–4.500)

## 2022-12-12 LAB — THYROID PEROXIDASE ANTIBODY: Thyroperoxidase Ab SerPl-aCnc: 230 IU/mL — ABNORMAL HIGH (ref 0–34)

## 2022-12-22 NOTE — Telephone Encounter (Signed)
Okay for them to swap appointments.   Also, if Holly Pineda does not want to do the tretinoin, the Cln acne wash in the shower (if he isn't already using it) is the easiest option we have to target inflamed bumps and blocked pores with one wash. Thank you!

## 2022-12-24 ENCOUNTER — Ambulatory Visit (INDEPENDENT_AMBULATORY_CARE_PROVIDER_SITE_OTHER): Payer: Self-pay | Admitting: Adult Health

## 2022-12-24 ENCOUNTER — Encounter: Payer: Self-pay | Admitting: Adult Health

## 2022-12-24 ENCOUNTER — Encounter: Payer: Self-pay | Admitting: Dermatology

## 2022-12-24 ENCOUNTER — Ambulatory Visit: Payer: BC Managed Care – PPO | Admitting: Dermatology

## 2022-12-24 VITALS — BP 105/68 | HR 78

## 2022-12-24 VITALS — BP 104/65 | HR 74 | Temp 97.8°F | Resp 14

## 2022-12-24 DIAGNOSIS — L821 Other seborrheic keratosis: Secondary | ICD-10-CM

## 2022-12-24 DIAGNOSIS — L820 Inflamed seborrheic keratosis: Secondary | ICD-10-CM

## 2022-12-24 DIAGNOSIS — B880 Other acariasis: Secondary | ICD-10-CM

## 2022-12-24 DIAGNOSIS — Z8669 Personal history of other diseases of the nervous system and sense organs: Secondary | ICD-10-CM

## 2022-12-24 DIAGNOSIS — S0081XA Abrasion of other part of head, initial encounter: Secondary | ICD-10-CM | POA: Diagnosis not present

## 2022-12-24 NOTE — Progress Notes (Signed)
   Follow Up Visit   Subjective  Holly Pineda is a 42 y.o. female who presents for the following: 3 month follow up. Facial rash. Hx demodex folliculitis. Has treated with oral Ivermectin 4 rounds and topical Permethrin as directed. Thinks is better but still feels like is not totally resolved.     The following portions of the chart were reviewed this encounter and updated as appropriate: medications, allergies, medical history  Review of Systems:  No other skin or systemic complaints except as noted in HPI or Assessment and Plan.  Objective  Well appearing patient in no apparent distress; mood and affect are within normal limits.  A focused examination was performed of the following areas: face  Relevant exam findings are noted in the Assessment and Plan.  Right Thigh - Anterior x1 Erythematous keratotic or waxy stuck-on papule or plaque.    Assessment & Plan   Inflamed seborrheic keratosis Right Thigh - Anterior x1  Symptomatic, irritating, patient would like treated.  Benign-appearing.  Call clinic for new or changing lesions.   Prior to procedure, discussed risks of blister formation, small wound, skin dyspigmentation, or rare scar following treatment. Recommend Vaseline ointment to treated areas while healing.   Destruction of lesion - Right Thigh - Anterior x1  Destruction method: cryotherapy   Informed consent: discussed and consent obtained   Lesion destroyed using liquid nitrogen: Yes   Region frozen until ice ball extended beyond lesion: Yes   Outcome: patient tolerated procedure well with no complications   Post-procedure details: wound care instructions given   Additional details:  Prior to procedure, discussed risks of blister formation, small wound, skin dyspigmentation, or rare scar following cryotherapy. Recommend Vaseline ointment to treated areas while healing.   SEBORRHEIC KERATOSIS - Stuck-on, waxy, tan-brown papules and/or plaques R-4  finger. - Benign-appearing - Discussed benign etiology and prognosis. - Observe - Call for any changes  EXCORIATION Exam: Excoriation at right cheek  Treatment Plan: Recommend vaseline. Call if not resolving.   Demodex folliculitis  Exam: papules and white keratotic plugs at face Previous KOH + for demodex  Treatment Plan: KOH performed today + for Demodex  Start sodium sulfacetamide 8%/sulfur 4$ wash dilay Start ivermectin cream daily Start metronidazole 0.75% cream twice a day  Return for TBSE As Scheduled.  I, Lawson Radar, CMA, am acting as scribe for Darden Dates, MD.   Documentation: I have reviewed the above documentation for accuracy and completeness, and I agree with the above.  Darden Dates, MD

## 2022-12-24 NOTE — Progress Notes (Signed)
[facility] [address] [place], [place] [address]   Office Visit Note  Patient Name: Holly Pineda Date of Birth [id number]  Medical Record number [id number]  Date of Service: 12/24/2022  Chief Complaint  Patient presents with   Follow-up     HPI Pt is here for acute visit.  She reports for the last month or so she has had trouble sleeping.  She describes falling asleep easily.  However, she is waking up at 4-5 o'clock in the morning, and is unable to go back to sleep.  She generally is in the bed at 1030pm, and she stops using her phone about 8pm.  She does play calming music, but no tv.  She takes melatonin and magnesium.  She has increased her magnesium to 750mg  two nights ago, and that seems to be working some.  She reports she has plans to do a sleep study, but does not have a date at this time.    Current Medication:  Outpatient Encounter Medications as of 12/24/2022  Medication Sig   ivermectin (STROMECTOL) 3 MG TABS tablet Take 3 tablets (9,000 mcg total) by mouth once a week.   levothyroxine (SYNTHROID) 88 MCG tablet Take 88 mcg by mouth daily before breakfast.   Magnesium Oxide 400 MG CAPS Take 1 tablet by mouth at bedtime.   Melatonin 5 MG CAPS Take by mouth.   Multiple Vitamin (MULTIVITAMIN) capsule Take 1 capsule by mouth daily.   No facility-administered encounter medications on file as of 12/24/2022.      Medical History: Past Medical History:  Diagnosis Date   Allergy    Annual physical exam 06/06/2018   Anxiety 02/13/2019   Atypical mole 06/05/2015   right lower back/mild, right lateral abdomen/mild   Atypical mole 08/20/2015   right upper back paraspinal/excision   Atypical mole 08/27/2015   right superior buttock/excision   Atypical mole 02/24/2016   right inframammary/mild   Atypical mole 10/21/2017   left lower back/moderate   Basal cell carcinoma 09/04/2013   left sternum mid chest   Goiter 02/19/2022     Vital Signs: BP  104/65   Pulse 74   Temp 97.8 F (36.6 C) (Tympanic)   Resp 14   SpO2 100%    Review of Systems  Constitutional:  Negative for chills, fatigue and fever.  HENT:  Negative for sore throat.   Eyes:  Negative for pain and itching.  Neurological:  Negative for dizziness.  Psychiatric/Behavioral:  Positive for sleep disturbance.     Physical Exam Vitals and nursing note reviewed.  Constitutional:      Appearance: Normal appearance.  HENT:     Head: Normocephalic.  Neurological:     Mental Status: She is alert.    Assessment/Plan: 1. History of sleep disturbance Discuss with PCP, follow through with sleep study as discussed. Let us know if new or worse symptoms develop.      General Counseling: tymira villalona understanding of the findings of todays visit and agrees with plan of treatment. I have discussed any further diagnostic evaluation that may be needed or ordered today. We also reviewed her medications today. she has been encouraged to call the office with any questions or concerns that should arise related to todays visit.   No orders of the defined types were placed in this encounter.   No orders of the defined types were placed in this encounter.   Time spent:25 Minutes    Johnna Acosta AGNP-C Nurse  Practitioner

## 2022-12-24 NOTE — Patient Instructions (Addendum)
Holly Reusing, DO Dermatologist [place], [place] Address: [address], [place], [place] [address] Phone: [phone]  Consider Restylane Contour mid face for follow up. Has worked well for her when used for training.  Cryotherapy Aftercare  Wash gently with soap and water everyday.   Apply Vaseline and Band-Aid daily until healed.    Recommend daily broad spectrum sunscreen SPF 30+ to sun-exposed areas, reapply every 2 hours as needed. Call for new or changing lesions.  Staying in the shade or wearing long sleeves, sun glasses (UVA+UVB protection) and wide brim hats (4-inch brim around the entire circumference of the hat) are also recommended for sun protection.     Due to recent changes in healthcare laws, you may see results of your pathology and/or laboratory studies on MyChart before the doctors have had a chance to review them. We understand that in some cases there may be results that are confusing or concerning to you. Please understand that not all results are received at the same time and often the doctors may need to interpret multiple results in order to provide you with the best plan of care or course of treatment. Therefore, we ask that you please give [place] 2 business days to thoroughly review all your results before contacting the office for clarification. Should we see a critical lab result, you will be contacted sooner.   If You Need Anything After Your Visit  If you have any questions or concerns for your doctor, please call our main line at [phone] and press option 4 to reach your doctor's medical assistant. If no one answers, please leave a voicemail as directed and we will return your call as soon as possible. Messages left after 4 pm will be answered the following business day.   You may also send [place] a message via MyChart. We typically respond to MyChart messages within 1-2 business days.  For prescription refills, please ask your pharmacy  to contact our office. Our fax number is [phone].  If you have an urgent issue when the clinic is closed that cannot wait until the next business day, you can page your doctor at the number below.    Please note that while we do our best to be available for urgent issues outside of office hours, we are not available 24/7.   If you have an urgent issue and are unable to reach [place], you may choose to seek medical care at your doctor's office, retail clinic, urgent care center, or emergency room.  If you have a medical emergency, please immediately call 911 or go to the emergency department.  Pager Numbers  - Dr. Gwen Pounds: [phone]  - Dr. Neale Burly: [phone]  - Dr. Roseanne Reno: [phone]  In the event of inclement weather, please call our main line at [phone] for an update on the status of any delays or closures.  Dermatology Medication Tips: Please keep the boxes that topical medications come in in order to help keep track of the instructions about where and how to use these. Pharmacies typically print the medication instructions only on the boxes and not directly on the medication tubes.   If your medication is too expensive, please contact our office at [phone] option 4 or send [place] a message through MyChart.   We are unable to tell what your co-pay for medications will be in advance as this is different depending on your insurance coverage. However, we may be able to find a substitute medication at lower cost or  fill out paperwork to get insurance to cover a needed medication.   If a prior authorization is required to get your medication covered by your insurance company, please allow [place] 1-2 business days to complete this process.  Drug prices often vary depending on where the prescription is filled and some pharmacies may offer cheaper prices.  The website www.goodrx.com contains coupons for medications through different pharmacies. The prices here do not account for  what the cost may be with help from insurance (it may be cheaper with your insurance), but the website can give you the price if you did not use any insurance.  - You can print the associated coupon and take it with your prescription to the pharmacy.  - You may also stop by our office during regular business hours and pick up a GoodRx coupon card.  - If you need your prescription sent electronically to a different pharmacy, notify our office through [facility] or by phone at [phone] option 4.     Si Usted Necesita Algo Despus de Su Visita  Tambin puede enviarnos un mensaje a travs de Clinical cytogeneticist. Por lo general respondemos a los mensajes de MyChart en el transcurso de 1 a 2 das hbiles.  Para renovar recetas, por favor pida a su farmacia que se ponga en contacto con nuestra oficina. Annie Sable de fax es [place] [phone].  Si tiene un asunto urgente cuando la clnica est cerrada y que no puede esperar hasta el siguiente da hbil, puede llamar/localizar a su doctor(a) al nmero que aparece a continuacin.   Por favor, tenga en cuenta que aunque hacemos todo lo posible para estar disponibles para asuntos urgentes fuera del horario de [place], no estamos disponibles las 24 horas del da, los 7 [address]  [address] de la [place].   Si tiene un problema urgente y no puede comunicarse con nosotros, puede optar por buscar atencin mdica  en el consultorio de su doctor(a), en una clnica privada, en un centro de atencin urgente o en una sala de emergencias.  Si tiene Engineer, drilling, por favor llame inmediatamente al 911 o vaya a la sala de emergencias.  Nmeros de bper  - Dr. Gwen Pounds: [phone]  - Dra. Moye: [phone]  - Dra. Roseanne Reno: [phone]  En caso de inclemencias del [place], por favor llame a Lacy Duverney principal al [phone] para una actualizacin sobre el [place] de cualquier retraso o cierre.  Consejos para la medicacin en dermatologa: Por favor, guarde  las cajas en las que vienen los medicamentos de uso tpico para ayudarle a seguir las instrucciones sobre dnde y cmo usarlos. Las farmacias generalmente imprimen las instrucciones del medicamento slo en las cajas y no directamente en los tubos del [place].   Si su medicamento es muy caro, por favor, pngase en contacto con Rolm Gala llamando al [phone] y presione la opcin 4 o envenos un mensaje a travs de Clinical cytogeneticist.   No podemos decirle cul ser su copago por los medicamentos por adelantado ya que esto es diferente dependiendo de la cobertura de su seguro. Sin embargo, es posible que podamos encontrar un medicamento sustituto a Audiological scientist un formulario para que el seguro cubra el medicamento que se considera necesario.   Si se requiere una autorizacin previa para que su compaa de seguros [place] su medicamento, por favor permtanos de 1 a 2 das hbiles para completar [address]  Los precios de los medicamentos varan con frecuencia dependiendo del Environmental consultant de dnde se surte la receta  y [place] farmacias pueden ofrecer precios ms baratos.  El sitio web www.goodrx.com tiene cupones para medicamentos de Health and safety inspector. Los precios aqu no tienen en cuenta lo que podra costar con la ayuda del seguro (puede ser ms barato con su seguro), pero el sitio web puede darle el precio si no utiliz Tourist information centre manager.  - Puede imprimir el cupn correspondiente y llevarlo con su receta a la farmacia.  - Tambin puede pasar por nuestra oficina durante el horario de atencin regular y Education officer, museum una tarjeta de cupones de GoodRx.  - Si necesita que su receta se enve electrnicamente a una farmacia diferente, informe a nuestra oficina a travs de MyChart de [place] o por telfono llamando al [phone] y presione la opcin 4.

## 2022-12-25 ENCOUNTER — Other Ambulatory Visit: Payer: Self-pay | Admitting: Nurse Practitioner

## 2022-12-25 ENCOUNTER — Encounter: Payer: Self-pay | Admitting: Nurse Practitioner

## 2022-12-25 DIAGNOSIS — G4709 Other insomnia: Secondary | ICD-10-CM

## 2022-12-25 MED ORDER — HYDROXYZINE PAMOATE 25 MG PO CAPS: 25.0000 mg | ORAL_CAPSULE | Freq: Every evening | ORAL | 0 refills | Status: DC | PRN

## 2022-12-30 ENCOUNTER — Other Ambulatory Visit: Payer: Self-pay

## 2022-12-30 ENCOUNTER — Encounter: Payer: Self-pay | Admitting: Nurse Practitioner

## 2022-12-30 ENCOUNTER — Ambulatory Visit: Payer: BC Managed Care – PPO | Admitting: Dermatology

## 2022-12-30 ENCOUNTER — Telehealth: Payer: Self-pay | Admitting: Dermatology

## 2022-12-30 ENCOUNTER — Other Ambulatory Visit: Payer: Self-pay | Admitting: Dermatology

## 2022-12-30 ENCOUNTER — Ambulatory Visit: Payer: BC Managed Care – PPO | Admitting: Nurse Practitioner

## 2022-12-30 VITALS — BP 110/72 | HR 97 | Temp 98.2°F | Resp 18 | Ht 59.0 in | Wt 107.6 lb

## 2022-12-30 DIAGNOSIS — G4709 Other insomnia: Secondary | ICD-10-CM | POA: Diagnosis not present

## 2022-12-30 MED ORDER — METRONIDAZOLE 0.75 % EX CREA: TOPICAL_CREAM | Freq: Two times a day (BID) | CUTANEOUS | 1 refills | Status: DC

## 2022-12-30 MED ORDER — TRAZODONE HCL 50 MG PO TABS: 25.0000 mg | ORAL_TABLET | Freq: Every evening | ORAL | 0 refills | Status: DC | PRN

## 2022-12-30 MED ORDER — IVERMECTIN 1 % EX CREA: 1.0000 | TOPICAL_CREAM | Freq: Every day | CUTANEOUS | 1 refills | Status: DC

## 2022-12-30 MED ORDER — SULFACETAMIDE SODIUM-SULFUR 8-4 % EX SUSP: CUTANEOUS | 1 refills | Status: DC

## 2022-12-30 NOTE — Assessment & Plan Note (Signed)
Start trazodone 25-50 mg daily as needed for sleep

## 2022-12-30 NOTE — Telephone Encounter (Signed)
Prescriptions sent to [facility] pharmacy

## 2022-12-30 NOTE — Progress Notes (Signed)
BP 110/72   Pulse 97   Temp 98.2 F (36.8 C) (Oral)   Resp 18   Ht 4\' 11"  (1.499 m)   Wt 107 lb 9.6 oz (48.8 kg)   LMP 12/06/2022   SpO2 99%   BMI 21.73 kg/m    Subjective:    Patient ID: Holly Pineda, female    DOB: June 30, 1981, 42 y.o.   MRN: [id number]  HPI: Holly Pineda is a 42 y.o. female  Chief Complaint  Patient presents with   Insomnia   Insomnia: she has tried hydroxyzine but was still unable to get any rest.  She says it kept her up instead of helping her sleep.  Patient reports she has a hard time staying asleep.  She says it has been going on for about a month and a half. Will trial trazodone 25-50 mg at bedtime.       12/30/2022    3:26 PM 12/30/2022    3:18 PM 07/09/2022    3:30 PM  Depression screen PHQ 2/9  Decreased Interest 0 0 0  Down, Depressed, Hopeless 0 0 0  PHQ - 2 Score 0 0 0  Altered sleeping 3  1  Tired, decreased energy 2  0  Change in appetite 0  0  Feeling bad or failure about yourself  0  0  Trouble concentrating 0  0  Moving slowly or fidgety/restless 0  0  Suicidal thoughts 0  0  PHQ-9 Score 5  1  Difficult doing work/chores Not difficult at all  Not difficult at all       12/30/2022    3:27 PM 07/09/2022    3:31 PM  GAD 7 : Generalized Anxiety Score  Nervous, Anxious, on Edge 1 0  Control/stop worrying 1 0  Worry too much - different things 2 0  Trouble relaxing 2 0  Restless 1 0  Easily annoyed or irritable 2 0  Afraid - awful might happen 0 0  Total GAD 7 Score 9 0  Anxiety Difficulty Somewhat difficult Not difficult at all      Relevant past medical, surgical, family and social history reviewed and updated as indicated. Interim medical history since our last visit reviewed. Allergies and medications reviewed and updated.  Review of Systems  Constitutional: Negative for fever or weight change.  Respiratory: Negative for cough and shortness of breath.   Cardiovascular: Negative for chest pain or  palpitations.  Gastrointestinal: Negative for abdominal pain, no bowel changes.  Musculoskeletal: Negative for gait problem or joint swelling.  Skin: Negative for rash.  Neurological: Negative for dizziness or headache.  No other specific complaints in a complete review of systems (except as listed in HPI above).      Objective:    BP 110/72   Pulse 97   Temp 98.2 F (36.8 C) (Oral)   Resp 18   Ht 4\' 11"  (1.499 m)   Wt 107 lb 9.6 oz (48.8 kg)   LMP 12/06/2022   SpO2 99%   BMI 21.73 kg/m   Wt Readings from Last 3 Encounters:  12/30/22 107 lb 9.6 oz (48.8 kg)  07/09/22 102 lb 14.4 oz (46.7 kg)  04/30/22 105 lb (47.6 kg)    Physical Exam  Constitutional: Patient appears well-developed and well-nourished. No distress.  HEENT: head atraumatic, normocephalic, pupils equal and reactive to light, neck supple Cardiovascular: Normal rate, regular rhythm and normal heart sounds.  No murmur heard. No BLE edema. Pulmonary/Chest: Effort normal and  breath sounds normal. No respiratory distress. Abdominal: Soft.  There is no tenderness. Psychiatric: Patient has a normal mood and affect. behavior is normal. Judgment and thought content normal.  Results for orders placed or performed in visit on 12/10/22  Thyroid peroxidase antibody  Result Value Ref Range   Thyroperoxidase Ab SerPl-aCnc 230 (H) 0 - 34 IU/mL  TSH  Result Value Ref Range   TSH 3.000 0.450 - 4.500 uIU/mL      Assessment & Plan:   Problem List Items Addressed This Visit       Other   Insomnia - Primary    Start trazodone 25-50 mg daily as needed for sleep      Relevant Medications   traZODone (DESYREL) 50 MG tablet     Follow up plan: Return in about 6 months (around 07/02/2023) for follow up.

## 2023-01-05 ENCOUNTER — Other Ambulatory Visit: Payer: Self-pay | Admitting: Nurse Practitioner

## 2023-01-05 DIAGNOSIS — G4709 Other insomnia: Secondary | ICD-10-CM

## 2023-01-05 MED ORDER — AZELASTINE HCL 0.1 % NA SOLN: 2.0000 | Freq: Two times a day (BID) | NASAL | 12 refills | Status: DC

## 2023-01-21 ENCOUNTER — Other Ambulatory Visit: Payer: Self-pay

## 2023-01-21 DIAGNOSIS — E039 Hypothyroidism, unspecified: Secondary | ICD-10-CM

## 2023-01-21 DIAGNOSIS — Z029 Encounter for administrative examinations, unspecified: Secondary | ICD-10-CM

## 2023-01-26 ENCOUNTER — Other Ambulatory Visit: Payer: Self-pay | Admitting: Nurse Practitioner

## 2023-01-26 ENCOUNTER — Encounter: Payer: Self-pay | Admitting: Adult Health

## 2023-01-26 DIAGNOSIS — G4709 Other insomnia: Secondary | ICD-10-CM

## 2023-01-27 ENCOUNTER — Other Ambulatory Visit: Payer: Self-pay | Admitting: Nurse Practitioner

## 2023-01-27 DIAGNOSIS — G4709 Other insomnia: Secondary | ICD-10-CM

## 2023-01-27 DIAGNOSIS — J301 Allergic rhinitis due to pollen: Secondary | ICD-10-CM | POA: Diagnosis not present

## 2023-01-27 DIAGNOSIS — G479 Sleep disorder, unspecified: Secondary | ICD-10-CM | POA: Diagnosis not present

## 2023-01-27 MED ORDER — TRAZODONE HCL 100 MG PO TABS: 100.0000 mg | ORAL_TABLET | Freq: Every evening | ORAL | 1 refills | Status: DC | PRN

## 2023-01-27 NOTE — Telephone Encounter (Signed)
Requested Prescriptions  Pending Prescriptions Disp Refills   traZODone (DESYREL) 50 MG tablet [Pharmacy Med Name: TRAZODONE 50 MG TABLET] 30 tablet 0    Sig: TAKE 0.5-1 TABLETS BY MOUTH AT BEDTIME AS NEEDED FOR SLEEP.     Psychiatry: Antidepressants - Serotonin Modulator Passed - 01/26/2023  7:16 PM      Passed - Valid encounter within last 6 months    Recent Outpatient Visits           4 weeks ago Other insomnia   [facility] Berniece Salines, FNP   6 months ago Annual physical exam   [facility] Berniece Salines, FNP       Future Appointments             In 5 months Zane Herald, Rudolpho Sevin, FNP [facility], [facility]

## 2023-01-29 DIAGNOSIS — J309 Allergic rhinitis, unspecified: Secondary | ICD-10-CM | POA: Diagnosis not present

## 2023-01-29 LAB — TSH: TSH: 1.95 u[IU]/mL (ref 0.450–4.500)

## 2023-01-29 LAB — T4F+T3FREE
Free T-3: 2.6 pg/mL
Free Thyroxine: 1.59 ng/dL
Thyroxine (T4): 9.3 ug/dL
Triiodothyronine (T-3), Serum: 83 ng/dL

## 2023-01-29 LAB — THYROID PEROXIDASE ANTIBODY: Thyroperoxidase Ab SerPl-aCnc: 227 IU/mL — ABNORMAL HIGH (ref 0–34)

## 2023-02-17 DIAGNOSIS — E039 Hypothyroidism, unspecified: Secondary | ICD-10-CM | POA: Diagnosis not present

## 2023-02-17 DIAGNOSIS — E063 Autoimmune thyroiditis: Secondary | ICD-10-CM | POA: Diagnosis not present

## 2023-02-17 DIAGNOSIS — E559 Vitamin D deficiency, unspecified: Secondary | ICD-10-CM | POA: Diagnosis not present

## 2023-02-17 DIAGNOSIS — E041 Nontoxic single thyroid nodule: Secondary | ICD-10-CM | POA: Diagnosis not present

## 2023-02-19 DIAGNOSIS — E063 Autoimmune thyroiditis: Secondary | ICD-10-CM | POA: Insufficient documentation

## 2023-03-05 ENCOUNTER — Encounter: Payer: Self-pay | Admitting: Adult Health

## 2023-03-05 ENCOUNTER — Other Ambulatory Visit: Payer: Self-pay

## 2023-03-05 ENCOUNTER — Ambulatory Visit (INDEPENDENT_AMBULATORY_CARE_PROVIDER_SITE_OTHER): Payer: Self-pay | Admitting: Adult Health

## 2023-03-05 VITALS — BP 110/80 | HR 80 | Ht 59.0 in | Wt 104.0 lb

## 2023-03-05 DIAGNOSIS — Z114 Encounter for screening for human immunodeficiency virus [HIV]: Secondary | ICD-10-CM

## 2023-03-05 DIAGNOSIS — E039 Hypothyroidism, unspecified: Secondary | ICD-10-CM

## 2023-03-05 DIAGNOSIS — H938X2 Other specified disorders of left ear: Secondary | ICD-10-CM

## 2023-03-05 NOTE — Progress Notes (Signed)
[facility] [address] [place], [place] [address]   Office Visit Note  Patient Name: Holly Pineda Date of Birth [id number]  Medical Record number [id number]  Date of Service: 03/05/2023  Chief Complaint  Patient presents with   Acute Visit    Patient c/o an intermittent aching in her L ear and headache. Denies pain or any drainage from her ear. She states she went to the beach last week and is unsure whether or not she may have gotten water in it.      HPI Pt is here for ear pain today.  She reports after returning from the beach she noticed sinus pressure and ear pain/pressure in the left.  She denies any fever or chills.  Reports the symptoms are intermittent. She is also requesting her yearly HIV testing.   Current Medication:  Outpatient Encounter Medications as of 03/05/2023  Medication Sig   azelastine (ASTELIN) 0.1 % nasal spray Place 2 sprays into both nostrils 2 (two) times daily. Use in each nostril as directed   levothyroxine (SYNTHROID) 88 MCG tablet Take 88 mcg by mouth daily before breakfast.   Magnesium Oxide 400 MG CAPS Take 1 tablet by mouth at bedtime.   Melatonin 5 MG CAPS Take by mouth at bedtime.   Multiple Vitamin (MULTIVITAMIN) capsule Take 1 capsule by mouth daily.   Sulfacetamide Sodium-Sulfur 8-4 % SUSP Use to wash face once a day (Patient taking differently: as needed. Use to wash face once a day)   traZODone (DESYREL) 100 MG tablet Take 1 tablet (100 mg total) by mouth at bedtime as needed for sleep.   [DISCONTINUED] Ivermectin (SOOLANTRA) 1 % CREA Apply 1 Application topically daily.   [DISCONTINUED] ivermectin (STROMECTOL) 3 MG TABS tablet Take 3 tablets (9,000 mcg total) by mouth once a week.   No facility-administered encounter medications on file as of 03/05/2023.      Medical History: Past Medical History:  Diagnosis Date   Allergy    Annual physical exam 06/06/2018   Anxiety 02/13/2019   Atypical mole 06/05/2015   right  lower back/mild, right lateral abdomen/mild   Atypical mole 08/20/2015   right upper back paraspinal/excision   Atypical mole 08/27/2015   right superior buttock/excision   Atypical mole 02/24/2016   right inframammary/mild   Atypical mole 10/21/2017   left lower back/moderate   Basal cell carcinoma 09/04/2013   left sternum mid chest   Goiter 02/19/2022     Vital Signs: BP 110/80 (BP Location: Left Arm, Patient Position: Sitting, Cuff Size: Normal)   Pulse 80   Ht 4\' 11"  (1.499 m)   Wt 104 lb (47.2 kg)   SpO2 98%   BMI 21.01 kg/m    Review of Systems  Constitutional:  Negative for chills, fatigue and fever.  HENT:  Positive for ear pain and sinus pressure.   Eyes:  Negative for pain.    Physical Exam Vitals reviewed.  Constitutional:      Appearance: Normal appearance.  HENT:     Head: Normocephalic.     Right Ear: Tympanic membrane and ear canal normal.     Left Ear: Tympanic membrane and ear canal normal.  Neurological:     Mental Status: She is alert.    Assessment/Plan: 1. Pressure sensation in left ear Discussed taking allergy medication and trying sudafed for congestion. Follow up via MyChart messenger if symptoms fail to improve or may return to clinic as needed for worsening symptoms.  2. Encounter for screening for HIV - HIV Antibody (routine testing w rflx)        General Counseling: Holly Pineda verbalizes understanding of the findings of todays visit and agrees with plan of treatment. I have discussed any further diagnostic evaluation that may be needed or ordered today. We also reviewed her medications today. she has been encouraged to call the office with any questions or concerns that should arise related to todays visit.   Orders Placed This Encounter  Procedures   HIV Antibody (routine testing w rflx)    No orders of the defined types were placed in this encounter.   Time spent:15 Minutes    Johnna Acosta AGNP-C Nurse  Practitioner

## 2023-03-08 ENCOUNTER — Encounter: Payer: Self-pay | Admitting: Nurse Practitioner

## 2023-03-08 ENCOUNTER — Other Ambulatory Visit: Payer: Self-pay | Admitting: Nurse Practitioner

## 2023-03-08 DIAGNOSIS — G4709 Other insomnia: Secondary | ICD-10-CM

## 2023-03-08 MED ORDER — GABAPENTIN 100 MG PO CAPS: 100.0000 mg | ORAL_CAPSULE | Freq: Every day | ORAL | 0 refills | Status: DC

## 2023-03-31 DIAGNOSIS — E063 Autoimmune thyroiditis: Secondary | ICD-10-CM | POA: Diagnosis not present

## 2023-03-31 DIAGNOSIS — E559 Vitamin D deficiency, unspecified: Secondary | ICD-10-CM | POA: Diagnosis not present

## 2023-03-31 DIAGNOSIS — E041 Nontoxic single thyroid nodule: Secondary | ICD-10-CM | POA: Diagnosis not present

## 2023-03-31 DIAGNOSIS — R896 Abnormal cytological findings in specimens from other organs, systems and tissues: Secondary | ICD-10-CM | POA: Diagnosis not present

## 2023-03-31 DIAGNOSIS — E039 Hypothyroidism, unspecified: Secondary | ICD-10-CM | POA: Diagnosis not present

## 2023-03-31 DIAGNOSIS — D440 Neoplasm of uncertain behavior of thyroid gland: Secondary | ICD-10-CM | POA: Diagnosis not present

## 2023-04-23 DIAGNOSIS — E039 Hypothyroidism, unspecified: Secondary | ICD-10-CM | POA: Diagnosis not present

## 2023-04-23 DIAGNOSIS — E063 Autoimmune thyroiditis: Secondary | ICD-10-CM | POA: Diagnosis not present

## 2023-04-23 DIAGNOSIS — R4589 Other symptoms and signs involving emotional state: Secondary | ICD-10-CM | POA: Diagnosis not present

## 2023-04-23 DIAGNOSIS — E041 Nontoxic single thyroid nodule: Secondary | ICD-10-CM | POA: Diagnosis not present

## 2023-04-28 ENCOUNTER — Other Ambulatory Visit: Payer: Self-pay

## 2023-04-28 DIAGNOSIS — E039 Hypothyroidism, unspecified: Secondary | ICD-10-CM

## 2023-04-30 ENCOUNTER — Other Ambulatory Visit: Payer: Self-pay

## 2023-04-30 DIAGNOSIS — E039 Hypothyroidism, unspecified: Secondary | ICD-10-CM

## 2023-05-02 LAB — TSH: TSH: 2.87 u[IU]/mL (ref 0.450–4.500)

## 2023-05-04 DIAGNOSIS — N898 Other specified noninflammatory disorders of vagina: Secondary | ICD-10-CM | POA: Diagnosis not present

## 2023-05-04 DIAGNOSIS — R87810 Cervical high risk human papillomavirus (HPV) DNA test positive: Secondary | ICD-10-CM | POA: Diagnosis not present

## 2023-05-04 DIAGNOSIS — R198 Other specified symptoms and signs involving the digestive system and abdomen: Secondary | ICD-10-CM | POA: Diagnosis not present

## 2023-05-04 LAB — HM PAP SMEAR: HM Pap smear: ABNORMAL

## 2023-05-11 DIAGNOSIS — E039 Hypothyroidism, unspecified: Secondary | ICD-10-CM | POA: Diagnosis not present

## 2023-05-11 DIAGNOSIS — E063 Autoimmune thyroiditis: Secondary | ICD-10-CM | POA: Diagnosis not present

## 2023-05-11 DIAGNOSIS — E041 Nontoxic single thyroid nodule: Secondary | ICD-10-CM | POA: Diagnosis not present

## 2023-05-11 DIAGNOSIS — R4589 Other symptoms and signs involving emotional state: Secondary | ICD-10-CM | POA: Diagnosis not present

## 2023-05-25 DIAGNOSIS — E041 Nontoxic single thyroid nodule: Secondary | ICD-10-CM | POA: Diagnosis not present

## 2023-05-25 DIAGNOSIS — E063 Autoimmune thyroiditis: Secondary | ICD-10-CM | POA: Diagnosis not present

## 2023-05-25 DIAGNOSIS — Z79890 Hormone replacement therapy (postmenopausal): Secondary | ICD-10-CM | POA: Diagnosis not present

## 2023-05-25 DIAGNOSIS — E0789 Other specified disorders of thyroid: Secondary | ICD-10-CM | POA: Diagnosis not present

## 2023-05-25 DIAGNOSIS — Z87891 Personal history of nicotine dependence: Secondary | ICD-10-CM | POA: Diagnosis not present

## 2023-06-07 ENCOUNTER — Ambulatory Visit: Payer: BC Managed Care – PPO | Admitting: Dermatology

## 2023-06-07 ENCOUNTER — Encounter: Payer: Self-pay | Admitting: Dermatology

## 2023-06-07 DIAGNOSIS — L853 Xerosis cutis: Secondary | ICD-10-CM | POA: Diagnosis not present

## 2023-06-07 DIAGNOSIS — B079 Viral wart, unspecified: Secondary | ICD-10-CM | POA: Diagnosis not present

## 2023-06-07 DIAGNOSIS — L858 Other specified epidermal thickening: Secondary | ICD-10-CM | POA: Diagnosis not present

## 2023-06-07 DIAGNOSIS — L738 Other specified follicular disorders: Secondary | ICD-10-CM

## 2023-06-07 DIAGNOSIS — L700 Acne vulgaris: Secondary | ICD-10-CM | POA: Diagnosis not present

## 2023-06-07 MED ORDER — TRETINOIN 0.05 % EX CREA: TOPICAL_CREAM | Freq: Every day | CUTANEOUS | 5 refills | Status: DC

## 2023-06-07 NOTE — Patient Instructions (Signed)

## 2023-06-07 NOTE — Progress Notes (Signed)
Follow-Up Visit   Subjective  Holly Pineda is a 42 y.o. female who presents for the following: Hx of rash on the legs which she thinks was from "dry brushing" so she stopped doing that and rash improved. Hx of demodex folliculitis of the face for which patient used sulfa wash, Ivermectin cream and Metronidazole 0.75% cream for a few months, as well as oral Ivermectin. Pt thinks that demodex folliculitis has resolved, but would like her face checked today. Pt c/o possible wart on the hand and would like lesion treated today if that's what it is.  The patient has spots, moles and lesions to be evaluated, some may be new or changing and the patient may have concern these could be cancer.   The following portions of the chart were reviewed this encounter and updated as appropriate: medications, allergies, medical history  Review of Systems:  No other skin or systemic complaints except as noted in HPI or Assessment and Plan.  Objective  Well appearing patient in no apparent distress; mood and affect are within normal limits.   A focused examination was performed of the following areas: the face and legs   Relevant exam findings are noted in the Assessment and Plan.  L ulnar palm 0.3 cm firm flesh flat papule.    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 No cure. - Observe. - If desired, patient can use an emollient (moisturizer) containing ammonium lactate (AmLactin), urea or salicylic acid once a day to smooth the area  Recommend starting moisturizer with exfoliant (Urea, Salicylic acid, or Lactic acid) one to two times daily to help smooth rough and bumpy skin.  OTC options include Cetaphil Rough and Bumpy lotion (Urea), Eucerin Roughness Relief lotion or spot treatment cream (Urea), CeraVe SA lotion/cream for Rough and Bumpy skin (Sal Acid), Gold Bond Rough and Bumpy cream (Sal Acid), and AmLactin  12% lotion/cream (Lactic Acid).  If applying in morning, also apply sunscreen to sun-exposed areas, since these exfoliating moisturizers can increase sensitivity to sun.   Hx of Demodex folliculitis previously tx with Ivermectin cream, Metronidazole 0.75% cream, sulfa wash, and oral Ivermectin.    Exam: Mild xerosis of the face not consistent with demodex folliculitis.    Treatment Plan: Resolved, no tx needed at this time   Viral warts, unspecified type L ulnar palm  Viral Wart (HPV) Counseling  Discussed viral / HPV (Human Papilloma Virus) etiology and risk of spread /infectivity to other areas of body as well as to other people.  Multiple treatments and methods may be required to clear warts and it is possible treatment may not be successful.  Treatment risks include discoloration; scarring and there is still potential for wart recurrence.  Destruction of lesion - L ulnar palm  Destruction method: cryotherapy   Informed consent: discussed and consent obtained   Lesion destroyed using liquid nitrogen: Yes   Region frozen until ice ball extended beyond lesion: Yes   Outcome: patient tolerated procedure well with no complications   Post-procedure details: wound care instructions given   Additional details:  Prior to procedure, discussed risks of blister formation, small wound, skin dyspigmentation, or rare scar following cryotherapy. Recommend Vaseline ointment to treated areas while healing.   Xerosis cutis  Acne vulgaris  Sebaceous hyperplasia  Keratosis pilaris   Xerosis - Diffuse xerotic patches of the face  - Recommend gentle, hydrating skin care - Gentle skin care  handout given - Recommend moisturizer with salicylic acid or AHA as tolerated  For gentle facial skin exfoliation, recommend CeraVe SA Cream or Lotion with salicylic acid or CeraVe Skin Renewing Nightly Exfoliating Treatment with 5% AHA (alpha hydroxy acids).  CeraVe also makes a gentle exfoliating Renewing SA  Cleanser.  There are also higher OTC strength AHAs, such as Loreal Revitalift 10% pure glycolic acid serum for increased exfoliation.  These products contains hydroxy acids that increase your skin's sensitivity to the sun and particularly the possibility of sunburn. Use a sunscreen, wear protective clothing, and limit sun exposure while using these products.    ACNE VULGARIS Exam: Open comedones, resolving inflammatory papule cheek  Chronic condition with duration or expected duration over one year. Currently well-controlled.  Treatment Plan: Restart Tretinoin 0.05% cream at bedtime as tolerated. Increased risk of irritation when used with AHA products. Topical retinoid medications like tretinoin/Retin-A, adapalene/Differin, tazarotene/Fabior, and Epiduo/Epiduo Forte can cause dryness and irritation when first started. Only apply a pea-sized amount to the entire affected area. Avoid applying it around the eyes, edges of mouth and creases at the nose. If you experience irritation, use a good moisturizer first and/or apply the medicine less often. If you are doing well with the medicine, you can increase how often you use it until you are applying every night. Be careful with sun protection while using this medication as it can make you sensitive to the sun. This medicine should not be used by pregnant women.   Sebaceous Hyperplasia - Small yellow papules with a central dell L cheek - Benign-appearing - Observe. Call for changes.  Return for appointment as scheduled.  Maylene Roes, CMA, am acting as scribe for Willeen Niece, MD .  Documentation: I have reviewed the above documentation for accuracy and completeness, and I agree with the above.  Willeen Niece, MD

## 2023-06-14 DIAGNOSIS — N951 Menopausal and female climacteric states: Secondary | ICD-10-CM | POA: Diagnosis not present

## 2023-06-14 DIAGNOSIS — R87810 Cervical high risk human papillomavirus (HPV) DNA test positive: Secondary | ICD-10-CM | POA: Diagnosis not present

## 2023-06-14 DIAGNOSIS — N898 Other specified noninflammatory disorders of vagina: Secondary | ICD-10-CM | POA: Diagnosis not present

## 2023-06-29 ENCOUNTER — Other Ambulatory Visit: Payer: Self-pay

## 2023-06-29 DIAGNOSIS — E039 Hypothyroidism, unspecified: Secondary | ICD-10-CM

## 2023-06-29 DIAGNOSIS — Z79899 Other long term (current) drug therapy: Secondary | ICD-10-CM

## 2023-06-29 DIAGNOSIS — Z0000 Encounter for general adult medical examination without abnormal findings: Secondary | ICD-10-CM

## 2023-07-05 ENCOUNTER — Other Ambulatory Visit: Payer: Self-pay

## 2023-07-05 ENCOUNTER — Encounter: Payer: Self-pay | Admitting: Physician Assistant

## 2023-07-05 DIAGNOSIS — Z0000 Encounter for general adult medical examination without abnormal findings: Secondary | ICD-10-CM

## 2023-07-05 DIAGNOSIS — E039 Hypothyroidism, unspecified: Secondary | ICD-10-CM

## 2023-07-05 DIAGNOSIS — Z79899 Other long term (current) drug therapy: Secondary | ICD-10-CM

## 2023-07-07 LAB — LIPID PANEL
Chol/HDL Ratio: 2.7 {ratio} (ref 0.0–4.4)
Cholesterol, Total: 194 mg/dL (ref 100–199)
HDL: 72 mg/dL (ref >39)
LDL Chol Calc (NIH): 110 mg/dL — ABNORMAL HIGH (ref 0–99)
Triglycerides: 63 mg/dL (ref 0–149)
VLDL Cholesterol Cal: 12 mg/dL (ref 5–40)

## 2023-07-07 LAB — VITAMIN D 25 HYDROXY (VIT D DEFICIENCY, FRACTURES): Vit D, 25-Hydroxy: 45.4 ng/mL (ref 30.0–100.0)

## 2023-07-07 LAB — COMPREHENSIVE METABOLIC PANEL
ALT: 12 [IU]/L (ref 0–32)
AST: 21 [IU]/L (ref 0–40)
Albumin: 4.5 g/dL (ref 3.9–4.9)
Alkaline Phosphatase: 68 [IU]/L (ref 44–121)
BUN/Creatinine Ratio: 20 (ref 9–23)
BUN: 15 mg/dL (ref 6–24)
Bilirubin Total: <0.2 mg/dL (ref 0.0–1.2)
CO2: 23 mmol/L (ref 20–29)
Calcium: 9.7 mg/dL (ref 8.7–10.2)
Chloride: 102 mmol/L (ref 96–106)
Creatinine, Ser: 0.75 mg/dL (ref 0.57–1.00)
Globulin, Total: 2.4 g/dL (ref 1.5–4.5)
Glucose: 90 mg/dL (ref 70–99)
Potassium: 4.3 mmol/L (ref 3.5–5.2)
Sodium: 140 mmol/L (ref 134–144)
Total Protein: 6.9 g/dL (ref 6.0–8.5)
eGFR: 101 mL/min/{1.73_m2} (ref >59)

## 2023-07-07 LAB — CBC WITH DIFFERENTIAL/PLATELET
Basophils Absolute: 0.1 10*3/uL (ref 0.0–0.2)
Basos: 1 %
EOS (ABSOLUTE): 0.3 10*3/uL (ref 0.0–0.4)
Eos: 4 %
Hematocrit: 39.1 % (ref 34.0–46.6)
Hemoglobin: 13 g/dL (ref 11.1–15.9)
Immature Grans (Abs): 0 10*3/uL (ref 0.0–0.1)
Immature Granulocytes: 0 %
Lymphocytes Absolute: 2.2 10*3/uL (ref 0.7–3.1)
Lymphs: 37 %
MCH: 31.8 pg (ref 26.6–33.0)
MCHC: 33.2 g/dL (ref 31.5–35.7)
MCV: 96 fL (ref 79–97)
Monocytes Absolute: 0.5 10*3/uL (ref 0.1–0.9)
Monocytes: 8 %
Neutrophils Absolute: 2.9 10*3/uL (ref 1.4–7.0)
Neutrophils: 50 %
Platelets: 212 10*3/uL (ref 150–450)
RBC: 4.09 x10E6/uL (ref 3.77–5.28)
RDW: 11.6 % — ABNORMAL LOW (ref 11.7–15.4)
WBC: 5.9 10*3/uL (ref 3.4–10.8)

## 2023-07-07 LAB — B12 AND FOLATE PANEL
Folate: 15.1 ng/mL (ref >3.0)
Vitamin B-12: 698 pg/mL (ref 232–1245)

## 2023-07-07 LAB — HEMOGLOBIN A1C
Est. average glucose Bld gHb Est-mCnc: 103 mg/dL
Hgb A1c MFr Bld: 5.2 % (ref 4.8–5.6)

## 2023-07-13 ENCOUNTER — Encounter: Payer: Self-pay | Admitting: Nurse Practitioner

## 2023-07-14 ENCOUNTER — Ambulatory Visit (INDEPENDENT_AMBULATORY_CARE_PROVIDER_SITE_OTHER): Payer: Self-pay | Admitting: Nurse Practitioner

## 2023-07-14 ENCOUNTER — Encounter: Payer: Self-pay | Admitting: Nurse Practitioner

## 2023-07-14 VITALS — BP 112/78 | HR 90 | Temp 98.1°F | Resp 12 | Ht 58.75 in | Wt 104.3 lb

## 2023-07-14 DIAGNOSIS — Z23 Encounter for immunization: Secondary | ICD-10-CM

## 2023-07-14 DIAGNOSIS — Z0000 Encounter for general adult medical examination without abnormal findings: Secondary | ICD-10-CM

## 2023-07-14 NOTE — Progress Notes (Signed)
Name: Holly Pineda   MRN: [id number]    DOB: 1981-05-12   Date:07/14/2023       Progress Note  Subjective  Chief Complaint  Chief Complaint  Patient presents with   Annual Exam    HPI  Patient presents for annual CPE.  Diet: well balanced Exercise: walking every day, works two jobs Sleep: 6-7 hours Last dental exam:last month Last eye exam: this year  The 10-year ASCVD risk score (Arnett DK, et al., 2019) is: 0.4%   Values used to calculate the score:     Age: 42 years     Sex: Female     Is Non-Hispanic African American: No     Diabetic: No     Tobacco smoker: No     Systolic Blood Pressure: 112 mmHg     Is BP treated: No     HDL Cholesterol: 72 mg/dL     Total Cholesterol: 194 mg/dL  Health Maintenance  Topic Date Due   COVID-19 Vaccine (3 - Pfizer risk series) 07/30/2023*   Flu Shot  11/01/2023*   Pneumococcal Vaccination (1 of 2 - PCV) 07/13/2024*   Mammogram  10/20/2023   Pap with HPV screening  05/03/2028   DTaP/Tdap/Td vaccine (2 - Td or Tdap) 06/06/2028   Hepatitis C Screening  Completed   HIV Screening  Completed   HPV Vaccine  Aged Out  *Topic was postponed. The date shown is not the original due date.    Flowsheet Row Office Visit from 07/14/2023 in [facility]  AUDIT-C Score 1      Depression: Phq 9 is  negative    07/14/2023    2:32 PM 12/30/2022    3:26 PM 12/30/2022    3:18 PM 07/09/2022    3:30 PM  Depression screen PHQ 2/9  Decreased Interest 0 0 0 0  Down, Depressed, Hopeless 0 0 0 0  PHQ - 2 Score 0 0 0 0  Altered sleeping 0 3  1  Tired, decreased energy 3 2  0  Change in appetite 0 0  0  Feeling bad or failure about yourself  0 0  0  Trouble concentrating 0 0  0  Moving slowly or fidgety/restless 0 0  0  Suicidal thoughts 0 0  0  PHQ-9 Score 3 5  1   Difficult doing work/chores Not difficult at all Not difficult at all  Not difficult at all   Hypertension: BP Readings from Last 3 Encounters:   07/14/23 112/78  03/05/23 110/80  12/30/22 110/72   Obesity: Wt Readings from Last 3 Encounters:  07/14/23 104 lb 4.8 oz (47.3 kg)  03/05/23 104 lb (47.2 kg)  12/30/22 107 lb 9.6 oz (48.8 kg)   BMI Readings from Last 3 Encounters:  07/14/23 21.25 kg/m  03/05/23 21.01 kg/m  12/30/22 21.73 kg/m     Vaccines:  HPV: up to at age 72 , ask insurance if age between 68-45  Shingrix: 5-64 yo and ask insurance if covered when patient above 51 yo Pneumonia:  educated and discussed with patient. Flu:  educated and discussed with patient.  Hep C Screening: completed STD testing and prevention (HIV/chl/gon/syphilis): completed Intimate partner violence:none Sexual History : sexually active Menstrual History/LMP/Abnormal Bleeding: LMP: 07/05/2023 Incontinence Symptoms: none  Breast cancer:  - Last Mammogram: 10/20/2022 - BRCA gene screening: none  Osteoporosis: Discussed high calcium and vitamin D supplementation, weight bearing exercises  Cervical cancer screening: 05/04/2023  Skin cancer: Discussed monitoring for atypical  lesions  Colorectal cancer: does not qualify Lung cancer:   Low Dose CT Chest recommended if Age 87-80 years, 20 pack-year currently smoking OR have quit w/in 15years. Patient does not qualify.   ECG: 06/06/2018  Advanced Care Planning: A voluntary discussion about advance care planning including the explanation and discussion of advance directives.  Discussed health care proxy and Living will, and the patient was able to identify a health care proxy as Alycia Rossetti, [facility].  Patient does not have a living will at present time. If patient does have living will, I have requested they bring this to the clinic to be scanned in to their chart.  Lipids: Lab Results  Component Value Date   CHOL 194 07/05/2023   CHOL 210 (H) 10/08/2022   CHOL 188 07/02/2022   Lab Results  Component Value Date   HDL 72 07/05/2023   HDL 74 10/08/2022   HDL 66 07/02/2022    Lab Results  Component Value Date   LDLCALC 110 (H) 07/05/2023   LDLCALC 128 (H) 10/08/2022   LDLCALC 107 (H) 07/02/2022   Lab Results  Component Value Date   TRIG 63 07/05/2023   TRIG 47 10/08/2022   TRIG 80 07/02/2022   Lab Results  Component Value Date   CHOLHDL 2.7 07/05/2023   CHOLHDL 2.8 07/02/2022   CHOLHDL 2.8 06/13/2021   No results found for: "LDLDIRECT"  Glucose: Glucose  Date Value Ref Range Status  07/05/2023 90 70 - 99 mg/dL Final  16/05/9603 88 70 - 99 mg/dL Final  54/04/8118 88 70 - 99 mg/dL Final    Patient Active Problem List   Diagnosis Date Noted   Hashimoto's thyroiditis 02/19/2023   Goiter 02/19/2022   Thyroid nodule 02/19/2022   Thyroid mass of unclear etiology 02/19/2022   Thyroid nodule 02/19/2022   Hypothyroidism 01/30/2022   Hypothyroidism 01/30/2022   Non-seasonal allergic rhinitis due to pollen 07/05/2020   Insomnia 02/13/2019   Anxiety 02/13/2019   Smoker 06/06/2018   Elevated LDL cholesterol level 06/06/2018   Vitamin D deficiency 06/06/2018   Annual physical exam 06/06/2018   Low vitamin D level 11/16/2014   Low vitamin D level 11/16/2014    Past Surgical History:  Procedure Laterality Date   AUGMENTATION MAMMAPLASTY Bilateral 2017   CESAREAN SECTION     CHOLECYSTECTOMY      Family History  Problem Relation Age of Onset   Cancer Mother    Hypertension Father    Dementia Maternal Grandmother    Liver cancer Paternal Grandmother    Breast cancer Maternal Great-grandmother     Social History   Socioeconomic History   Marital status: Divorced    Spouse name: Not on file   Number of children: 2   Years of education: Not on file   Highest education level: Not on file  Occupational History   Not on file  Tobacco Use   Smoking status: Former    Current packs/day: 0.00    Average packs/day: 1 pack/day for 20.0 years (20.0 ttl pk-yrs)    Types: Cigarettes    Start date: 08/18/1998    Quit date: 08/18/2018     Years since quitting: 4.9   Smokeless tobacco: Never  Vaping Use   Vaping status: Never Used  Substance and Sexual Activity   Alcohol use: No   Drug use: No   Sexual activity: Yes    Birth control/protection: None, Condom  Other Topics Concern   Not on file  Social History Narrative  Not on file   Social Determinants of Health   Financial Resource Strain: Medium Risk (07/14/2023)   Overall Financial Resource Strain (CARDIA)    Difficulty of Paying Living Expenses: Somewhat hard  Food Insecurity: No Food Insecurity (07/14/2023)   Hunger Vital Sign    Worried About Running Out of Food in the Last Year: Never true    Ran Out of Food in the Last Year: Never true  Transportation Needs: No Transportation Needs (07/14/2023)   PRAPARE - Administrator, Civil Service (Medical): No    Lack of Transportation (Non-Medical): No  Physical Activity: Sufficiently Active (07/14/2023)   Exercise Vital Sign    Days of Exercise per Week: 5 days    Minutes of Exercise per Session: 30 min  Stress: Stress Concern Present (07/14/2023)   [employer] of Occupational Health - Occupational Stress Questionnaire    Feeling of Stress : To some extent  Social Connections: Moderately Isolated (07/14/2023)   Social Connection and Isolation Panel [NHANES]    Frequency of Communication with Friends and Family: More than three times a week    Frequency of Social Gatherings with Friends and Family: Once a week    Attends Religious Services: More than 4 times per year    Active Member of [employer] or Organizations: No    Attends Banker Meetings: Never    Marital Status: Divorced  Catering manager Violence: Not At Risk (07/14/2023)   Humiliation, Afraid, Rape, and Kick questionnaire    Fear of Current or Ex-Partner: No    Emotionally Abused: No    Physically Abused: No    Sexually Abused: No     Current Outpatient Medications:    ascorbic acid (VITAMIN C) 250 MG CHEW, Chew by  mouth., Disp: , Rfl:    azelastine (ASTELIN) 0.1 % nasal spray, Place 2 sprays into both nostrils 2 (two) times daily. Use in each nostril as directed, Disp: 30 mL, Rfl: 12   ergocalciferol, VITAMIN D2, (DRISDOL) 200 MCG/ML drops, Take by mouth., Disp: , Rfl:    fluticasone (FLONASE) 50 MCG/ACT nasal spray, , Disp: , Rfl:    levothyroxine (SYNTHROID) 88 MCG tablet, Take 88 mcg by mouth daily before breakfast., Disp: , Rfl:    Magnesium Glycinate 120 MG CAPS, Take by mouth., Disp: , Rfl:    Multiple Vitamin (MULTIVITAMIN) capsule, Take 1 capsule by mouth daily., Disp: , Rfl:    OMEGA-3-ACID ETHYL ESTERS & D3 PO, Take by mouth., Disp: , Rfl:    traZODone (DESYREL) 100 MG tablet, Take 1 tablet (100 mg total) by mouth at bedtime as needed for sleep., Disp: 90 tablet, Rfl: 1  No Known Allergies   ROS  Constitutional: Negative for fever or weight change.  Respiratory: Negative for cough and shortness of breath.   Cardiovascular: Negative for chest pain or palpitations.  Gastrointestinal: Negative for abdominal pain, no bowel changes.  Musculoskeletal: Negative for gait problem or joint swelling.  Skin: Negative for rash.  Neurological: Negative for dizziness or headache.  No other specific complaints in a complete review of systems (except as listed in HPI above).   Objective  Vitals:   07/14/23 1422  BP: 112/78  Pulse: 90  Resp: 12  Temp: 98.1 F (36.7 C)  TempSrc: Oral  SpO2: 100%  Weight: 104 lb 4.8 oz (47.3 kg)  Height: 4' 10.75" (1.492 m)    Body mass index is 21.25 kg/m.  Physical Exam Constitutional: Patient appears well-developed and well-nourished. No  distress.  HENT: Head: Normocephalic and atraumatic. Ears: B TMs ok, no erythema or effusion; Nose: Nose normal. Mouth/Throat: Oropharynx is clear and moist. No oropharyngeal exudate.  Eyes: Conjunctivae and EOM are normal. Pupils are equal, round, and reactive to light. No scleral icterus.  Neck: Normal range of motion.  Neck supple. No JVD present. No thyromegaly present.  Cardiovascular: Normal rate, regular rhythm and normal heart sounds.  No murmur heard. No BLE edema. Pulmonary/Chest: Effort normal and breath sounds normal. No respiratory distress. Abdominal: Soft. Bowel sounds are normal, no distension. There is no tenderness. no masses Breast: no lumps or masses, no nipple discharge or rashes Musculoskeletal: Normal range of motion, no joint effusions. No gross deformities Neurological: he is alert and oriented to person, place, and time. No cranial nerve deficit. Coordination, balance, strength, speech and gait are normal.  Skin: Skin is warm and dry. No rash noted. No erythema.  Psychiatric: Patient has a normal mood and affect. behavior is normal. Judgment and thought content normal.   Recent Results (from the past 2160 hour(s))  TSH     Status: None   Collection Time: 04/30/23  8:25 AM  Result Value Ref Range   TSH 2.870 0.450 - 4.500 uIU/mL  HM PAP SMEAR     Status: None   Collection Time: 05/04/23 12:00 AM  Result Value Ref Range   HM Pap smear Abnormal     Comment: [facility] Everywhere  Comp Met (CMET)     Status: None   Collection Time: 07/05/23  7:34 AM  Result Value Ref Range   Glucose 90 70 - 99 mg/dL   BUN 15 6 - 24 mg/dL   Creatinine, Ser 2.70 0.57 - 1.00 mg/dL   eGFR 623 >76 EG/BTD/1.76   BUN/Creatinine Ratio 20 9 - 23   Sodium 140 134 - 144 mmol/L   Potassium 4.3 3.5 - 5.2 mmol/L   Chloride 102 96 - 106 mmol/L   CO2 23 20 - 29 mmol/L   Calcium 9.7 8.7 - 10.2 mg/dL   Total Protein 6.9 6.0 - 8.5 g/dL   Albumin 4.5 3.9 - 4.9 g/dL   Globulin, Total 2.4 1.5 - 4.5 g/dL   Bilirubin Total <1.6 0.0 - 1.2 mg/dL   Alkaline Phosphatase 68 44 - 121 IU/L   AST 21 0 - 40 IU/L   ALT 12 0 - 32 IU/L  Lipid panel     Status: Abnormal   Collection Time: 07/05/23  7:34 AM  Result Value Ref Range   Cholesterol, Total 194 100 - 199 mg/dL   Triglycerides 63 0 - 149 mg/dL   HDL 72 >07 mg/dL    VLDL Cholesterol Cal 12 5 - 40 mg/dL   LDL Chol Calc (NIH) 371 (H) 0 - 99 mg/dL   Chol/HDL Ratio 2.7 0.0 - 4.4 ratio    Comment:                                   T. Chol/HDL Ratio                                             Men  Women  1/2 Avg.Risk  3.4    3.3                                   Avg.Risk  5.0    4.4                                2X Avg.Risk  9.6    7.1                                3X Avg.Risk 23.4   11.0   VITAMIN D 25 Hydroxy (Vit-D Deficiency, Fractures)     Status: None   Collection Time: 07/05/23  7:34 AM  Result Value Ref Range   Vit D, 25-Hydroxy 45.4 30.0 - 100.0 ng/mL    Comment: Vitamin D deficiency has been defined by the Institute of Medicine and an Endocrine Society practice guideline as a level of serum 25-OH vitamin D less than 20 ng/mL (1,2). The Endocrine Society went on to further define vitamin D insufficiency as a level between 21 and 29 ng/mL (2). 1. IOM (Institute of Medicine). 2010. Dietary reference    intakes for calcium and D. [place]: The    [employer]. 2. Holick MF, Binkley Kirby, Bischoff-Ferrari HA, et al.    Evaluation, treatment, and prevention of vitamin D    deficiency: an Endocrine Society clinical practice    guideline. JCEM. 2011 Jul; 96(7):1911-30.   Hemoglobin A1c     Status: None   Collection Time: 07/05/23  7:34 AM  Result Value Ref Range   Hgb A1c MFr Bld 5.2 4.8 - 5.6 %    Comment:          Prediabetes: 5.7 - 6.4          Diabetes: >6.4          Glycemic control for adults with diabetes: <7.0    Est. average glucose Bld gHb Est-mCnc 103 mg/dL  V95 and Folate Panel     Status: None   Collection Time: 07/05/23  7:34 AM  Result Value Ref Range   Vitamin B-12 698 232 - 1,245 pg/mL   Folate 15.1 >3.0 ng/mL    Comment: A serum folate concentration of less than 3.1 ng/mL is considered to represent clinical deficiency.   CBC with Differential/Platelet     Status: Abnormal    Collection Time: 07/05/23  7:34 AM  Result Value Ref Range   WBC 5.9 3.4 - 10.8 x10E3/uL   RBC 4.09 3.77 - 5.28 x10E6/uL   Hemoglobin 13.0 11.1 - 15.9 g/dL   Hematocrit 63.8 75.6 - 46.6 %   MCV 96 79 - 97 fL   MCH 31.8 26.6 - 33.0 pg   MCHC 33.2 31.5 - 35.7 g/dL   RDW 43.3 (L) 29.5 - 18.8 %   Platelets 212 150 - 450 x10E3/uL   Neutrophils 50 Not Estab. %   Lymphs 37 Not Estab. %   Monocytes 8 Not Estab. %   Eos 4 Not Estab. %   Basos 1 Not Estab. %   Neutrophils Absolute 2.9 1.4 - 7.0 x10E3/uL   Lymphocytes Absolute 2.2 0.7 - 3.1 x10E3/uL   Monocytes Absolute 0.5 0.1 - 0.9 x10E3/uL   EOS (ABSOLUTE) 0.3 0.0 - 0.4 x10E3/uL   Basophils Absolute 0.1 0.0 -  0.2 x10E3/uL   Immature Granulocytes 0 Not Estab. %   Immature Grans (Abs) 0.0 0.0 - 0.1 x10E3/uL     Fall Risk:    12/30/2022    3:18 PM 07/09/2022    3:30 PM  Fall Risk   Falls in the past year? 0 0  Number falls in past yr: 0 0  Injury with Fall? 0 0  Follow up  Falls evaluation completed     Functional Status Survey: Is the patient deaf or have difficulty hearing?: No Does the patient have difficulty seeing, even when wearing glasses/contacts?: No Does the patient have difficulty concentrating, remembering, or making decisions?: No Does the patient have difficulty walking or climbing stairs?: No Does the patient have difficulty dressing or bathing?: No Does the patient have difficulty doing errands alone such as visiting a doctor's office or shopping?: No   Assessment & Plan   1. Annual physical exam Recently had labs done, went over labs with patient Up to date on pap and mammogram Scheduled with GI to discuss colonoscopy due to new family history of colon cancer    -USPSTF grade A and B recommendations reviewed with patient; age-appropriate recommendations, preventive care, screening tests, etc discussed and encouraged; healthy living encouraged; see AVS for patient education given to patient -Discussed  importance of 150 minutes of physical activity weekly, eat two servings of fish weekly, eat one serving of tree nuts ( cashews, pistachios, pecans, almonds.Marland Kitchen) every other day, eat 6 servings of fruit/vegetables daily and drink plenty of water and avoid sweet beverages.   -Reviewed Health Maintenance: yes

## 2023-07-15 ENCOUNTER — Encounter: Payer: Self-pay | Admitting: Nurse Practitioner

## 2023-07-22 ENCOUNTER — Other Ambulatory Visit: Payer: Self-pay | Admitting: Nurse Practitioner

## 2023-07-22 DIAGNOSIS — G4709 Other insomnia: Secondary | ICD-10-CM

## 2023-07-22 NOTE — Telephone Encounter (Signed)
Requested Prescriptions  Pending Prescriptions Disp Refills   traZODone (DESYREL) 100 MG tablet [Pharmacy Med Name: TRAZODONE 100 MG TABLET] 90 tablet 1    Sig: TAKE 1 TABLET BY MOUTH AT BEDTIME AS NEEDED FOR SLEEP.     Psychiatry: Antidepressants - Serotonin Modulator Passed - 07/22/2023  3:26 PM      Passed - Valid encounter within last 6 months    Recent Outpatient Visits           1 week ago Annual physical exam   [facility] Berniece Salines, FNP   6 months ago Other insomnia   [facility] Berniece Salines, FNP   1 year ago Annual physical exam   [facility] Berniece Salines, FNP       Future Appointments             In 12 months Zane Herald, Rudolpho Sevin, FNP [facility], [facility]

## 2023-07-23 DIAGNOSIS — D34 Benign neoplasm of thyroid gland: Secondary | ICD-10-CM | POA: Diagnosis not present

## 2023-07-23 DIAGNOSIS — E039 Hypothyroidism, unspecified: Secondary | ICD-10-CM | POA: Diagnosis not present

## 2023-07-23 DIAGNOSIS — Z8639 Personal history of other endocrine, nutritional and metabolic disease: Secondary | ICD-10-CM | POA: Diagnosis not present

## 2023-07-23 DIAGNOSIS — E063 Autoimmune thyroiditis: Secondary | ICD-10-CM | POA: Diagnosis not present

## 2023-07-23 DIAGNOSIS — Z888 Allergy status to other drugs, medicaments and biological substances status: Secondary | ICD-10-CM | POA: Diagnosis not present

## 2023-07-23 DIAGNOSIS — E041 Nontoxic single thyroid nodule: Secondary | ICD-10-CM | POA: Diagnosis not present

## 2023-07-24 ENCOUNTER — Other Ambulatory Visit: Payer: Self-pay

## 2023-07-24 ENCOUNTER — Emergency Department
Admission: EM | Admit: 2023-07-24 | Discharge: 2023-07-24 | Disposition: A | Discharge status: Home / Self Care | Payer: BC Managed Care – PPO | Attending: Emergency Medicine | Admitting: Emergency Medicine

## 2023-07-24 DIAGNOSIS — Z4801 Encounter for change or removal of surgical wound dressing: Secondary | ICD-10-CM | POA: Diagnosis not present

## 2023-07-24 DIAGNOSIS — Z5189 Encounter for other specified aftercare: Secondary | ICD-10-CM

## 2023-07-24 NOTE — ED Triage Notes (Signed)
Pt presents to ER with c/o post op problem.  Pt reports she had left thyroidectomy yesterday at [facility], and states today, the bandage on her throat feels very tight.  Pt states she was told that her bandage is supposed to stay on for the next week, but that it is feeling very uncomfortable.  Pt denies diff breathing at this time.  Pt is otherwise A&O x4 and in NAD at this time.

## 2023-07-24 NOTE — Discharge Instructions (Signed)
Leave the dressing intact. Follow up with your surgery team this week. You can take benadryl and apply ice to the area for 15 minutes at a time.   Please return to the ED if you have any difficulty swallowing. I would recommend going to [facility] as this is where the surgery was completed.

## 2023-07-24 NOTE — ED Notes (Addendum)
Pts post-op dressing changed by Crystal RN per VO from PA [employer].

## 2023-07-24 NOTE — ED Provider Notes (Signed)
[facility] Provider Note    Event Date/Time   First MD Initiated Contact with Patient 07/24/23 2015     (approximate)   History   Post-op Problem   HPI  Holly Pineda is a 42 y.o. female who presents for evaluation after a thyroidectomy that she had done at [facility] yesterday. She feels like the dressing over the the wound is really tight. She called the surgery nurse who recommended she come to the ED for evaluation. Patient reports her pain is under control she is just here because the dressing is uncomfortable. She states the surgeon advised her to leave the dressing intact until Friday.      Physical Exam   Triage Vital Signs: ED Triage Vitals  Encounter Vitals Group     BP 07/24/23 1955 133/79     Systolic BP Percentile --      Diastolic BP Percentile --      Pulse Rate 07/24/23 1955 95     Resp 07/24/23 1955 18     Temp 07/24/23 1955 99.1 F (37.3 C)     Temp Source 07/24/23 1955 Oral     SpO2 07/24/23 1955 100 %     Weight 07/24/23 1955 102 lb (46.3 kg)     Height 07/24/23 1955 4\' 11"  (1.499 m)     Head Circumference --      Peak Flow --      Pain Score 07/24/23 1954 4     Pain Loc --      Pain Education --      Exclude from Growth Chart --     Most recent vital signs: Vitals:   07/24/23 1955  BP: 133/79  Pulse: 95  Resp: 18  Temp: 99.1 F (37.3 C)  SpO2: 100%   General: Awake, no distress.  CV:  Good peripheral perfusion.  Resp:  Normal effort.  Abd:  No distention.  Other:  Non-adherent gauze with tegaderm over top on patients anterior neck, slight redness surrounding but no swelling.    ED Results / Procedures / Treatments   Labs (all labs ordered are listed, but only abnormal results are displayed) Labs Reviewed - No data to display   PROCEDURES:  Critical Care performed: No  Procedures   MEDICATIONS ORDERED IN ED: Medications - No data to display   IMPRESSION / MDM / ASSESSMENT AND PLAN / ED  COURSE  I reviewed the triage vital signs and the nursing notes.                             42 year old female presents for evaluation of surgical site from thyroidectomy. VSS and patient NAD on exam.   Differential diagnosis includes, but is not limited to, wound infection, post surgical complications.  Patient's presentation is most consistent with acute, uncomplicated illness.  From what I can see the wound does not appear infected. There is no significant swelling. She does not have any difficulty breathing concerning for airway compromise.  I offered to change the patient's dressing but did advise that the wound underneath is sterile as it was dressed in surgery and changing the dressing would potentially introduce infection. Patient elected to hold off on changing the dressing.   There was a little bit of redness around the dressing, so patient may be having an allergic reaction to the tegaderm. She can take benadryl as needed. I also advised her to apply  ice to the area to help with any potential pain and swelling. I believe this is the normal course of wound healing. Patient's pain is under control.She was given return precautions. I advised her to follow up with her surgeon later this week.  She voiced understanding, all questions were answered and she was stable at discharge.     FINAL CLINICAL IMPRESSION(S) / ED DIAGNOSES   Final diagnoses:  Visit for wound check     Rx / DC Orders   ED Discharge Orders     None        Note:  This document was prepared using Dragon voice recognition software and may include unintentional dictation errors.   Cameron Ali, PA-C 07/24/23 2034    Chesley Noon, MD 07/24/23 2218

## 2023-08-03 DIAGNOSIS — E041 Nontoxic single thyroid nodule: Secondary | ICD-10-CM | POA: Diagnosis not present

## 2023-08-03 DIAGNOSIS — R499 Unspecified voice and resonance disorder: Secondary | ICD-10-CM | POA: Diagnosis not present

## 2023-09-02 DIAGNOSIS — Z83719 Family history of colon polyps, unspecified: Secondary | ICD-10-CM | POA: Diagnosis not present

## 2023-09-02 DIAGNOSIS — R143 Flatulence: Secondary | ICD-10-CM | POA: Diagnosis not present

## 2023-09-02 DIAGNOSIS — R194 Change in bowel habit: Secondary | ICD-10-CM | POA: Diagnosis not present

## 2023-09-06 ENCOUNTER — Other Ambulatory Visit: Payer: Self-pay | Admitting: Obstetrics and Gynecology

## 2023-09-06 DIAGNOSIS — Z1231 Encounter for screening mammogram for malignant neoplasm of breast: Secondary | ICD-10-CM

## 2023-09-28 ENCOUNTER — Encounter: Payer: BC Managed Care – PPO | Admitting: Dermatology

## 2023-09-29 ENCOUNTER — Encounter: Payer: BC Managed Care – PPO | Admitting: Dermatology

## 2023-09-30 DIAGNOSIS — R109 Unspecified abdominal pain: Secondary | ICD-10-CM | POA: Diagnosis not present

## 2023-09-30 DIAGNOSIS — E041 Nontoxic single thyroid nodule: Secondary | ICD-10-CM | POA: Diagnosis not present

## 2023-10-05 DIAGNOSIS — R87810 Cervical high risk human papillomavirus (HPV) DNA test positive: Secondary | ICD-10-CM | POA: Diagnosis not present

## 2023-10-05 DIAGNOSIS — Z01419 Encounter for gynecological examination (general) (routine) without abnormal findings: Secondary | ICD-10-CM | POA: Diagnosis not present

## 2023-10-08 ENCOUNTER — Ambulatory Visit: Payer: Self-pay

## 2023-10-08 DIAGNOSIS — K640 First degree hemorrhoids: Secondary | ICD-10-CM | POA: Diagnosis not present

## 2023-10-08 DIAGNOSIS — Z1211 Encounter for screening for malignant neoplasm of colon: Secondary | ICD-10-CM | POA: Diagnosis not present

## 2023-10-08 DIAGNOSIS — Z83719 Family history of colon polyps, unspecified: Secondary | ICD-10-CM | POA: Diagnosis not present

## 2023-10-08 DIAGNOSIS — R194 Change in bowel habit: Secondary | ICD-10-CM | POA: Diagnosis not present

## 2023-10-08 DIAGNOSIS — K5900 Constipation, unspecified: Secondary | ICD-10-CM | POA: Diagnosis not present

## 2023-10-12 ENCOUNTER — Ambulatory Visit: Payer: BC Managed Care – PPO | Admitting: Dermatology

## 2023-10-12 DIAGNOSIS — D1801 Hemangioma of skin and subcutaneous tissue: Secondary | ICD-10-CM

## 2023-10-12 DIAGNOSIS — D224 Melanocytic nevi of scalp and neck: Secondary | ICD-10-CM

## 2023-10-12 DIAGNOSIS — L820 Inflamed seborrheic keratosis: Secondary | ICD-10-CM

## 2023-10-12 DIAGNOSIS — L578 Other skin changes due to chronic exposure to nonionizing radiation: Secondary | ICD-10-CM | POA: Diagnosis not present

## 2023-10-12 DIAGNOSIS — Z85828 Personal history of other malignant neoplasm of skin: Secondary | ICD-10-CM

## 2023-10-12 DIAGNOSIS — L988 Other specified disorders of the skin and subcutaneous tissue: Secondary | ICD-10-CM

## 2023-10-12 DIAGNOSIS — D229 Melanocytic nevi, unspecified: Secondary | ICD-10-CM

## 2023-10-12 DIAGNOSIS — L219 Seborrheic dermatitis, unspecified: Secondary | ICD-10-CM

## 2023-10-12 DIAGNOSIS — W908XXA Exposure to other nonionizing radiation, initial encounter: Secondary | ICD-10-CM | POA: Diagnosis not present

## 2023-10-12 DIAGNOSIS — L814 Other melanin hyperpigmentation: Secondary | ICD-10-CM | POA: Diagnosis not present

## 2023-10-12 DIAGNOSIS — D2262 Melanocytic nevi of left upper limb, including shoulder: Secondary | ICD-10-CM

## 2023-10-12 DIAGNOSIS — Z1283 Encounter for screening for malignant neoplasm of skin: Secondary | ICD-10-CM | POA: Diagnosis not present

## 2023-10-12 DIAGNOSIS — L821 Other seborrheic keratosis: Secondary | ICD-10-CM

## 2023-10-12 DIAGNOSIS — Z86018 Personal history of other benign neoplasm: Secondary | ICD-10-CM

## 2023-10-12 MED ORDER — KETOCONAZOLE 2 % EX CREA: TOPICAL_CREAM | CUTANEOUS | 3 refills | Status: AC

## 2023-10-12 NOTE — Patient Instructions (Addendum)
 For hair: Can try Vital Proteins (Collagen Peptides) to add more protein to diet to promote hair growth. May also take Viviscal hair growth vitamin supplements.      Cosmetic Dermatologists:  Normand Sloop, M.D. [address], [address] [place], [place] [address] [phone]  [email or web address]   [facility] G. Kathyrn Sheriff, MD, FAAD [id number] [place] [address] [place], [place] [address]   Cryotherapy Aftercare  Wash gently with soap and water everyday.   Apply Vaseline and Band-Aid daily until healed.      Melanoma ABCDEs  Melanoma is the most dangerous type of skin cancer, and is the leading cause of death from skin disease.  You are more likely to develop melanoma if you: Have light-colored skin, light-colored eyes, or red or blond hair Spend a lot of time in the sun Tan regularly, either outdoors or in a tanning bed Have had blistering sunburns, especially during childhood Have a close family member who has had a melanoma Have atypical moles or large birthmarks  Early detection of melanoma is key since treatment is typically straightforward and cure rates are extremely high if we catch it early.   The first sign of melanoma is often a change in a mole or a new dark spot.  The ABCDE system is a way of remembering the signs of melanoma.  A for asymmetry:  The two halves do not match. B for border:  The edges of the growth are irregular. C for color:  A mixture of colors are present instead of an even brown color. D for diameter:  Melanomas are usually (but not always) greater than 6mm - the size of a pencil eraser. E for evolution:  The spot keeps changing in size, shape, and color.  Please check your skin once per month between visits. You can use a small mirror in front and a large mirror behind you to keep an eye on the back side or your body.   If you see any new or changing lesions before your next follow-up, please call  to schedule a visit.  Please continue daily skin protection including broad spectrum sunscreen SPF 30+ to sun-exposed areas, reapplying every 2 hours as needed when you're outdoors.   Staying in the shade or wearing long sleeves, sun glasses (UVA+UVB protection) and wide brim hats (4-inch brim around the entire circumference of the hat) are also recommended for sun protection.       Due to recent changes in healthcare laws, you may see results of your pathology and/or laboratory studies on MyChart before the doctors have had a chance to review them. We understand that in some cases there may be results that are confusing or concerning to you. Please understand that not all results are received at the same time and often the doctors may need to interpret multiple results in order to provide you with the best plan of care or course of treatment. Therefore, we ask that you please give [place] 2 business days to thoroughly review all your results before contacting the office for clarification. Should we see a critical lab result, you will be contacted sooner.   If You Need Anything After Your Visit  If you have any questions or concerns for your doctor, please call our main line at [phone] and press option 4 to reach your doctor's medical assistant. If no one answers, please leave a voicemail as directed and we will return your call as soon  as possible. Messages left after 4 pm will be answered the following business day.   You may also send [place] a message via MyChart. We typically respond to MyChart messages within 1-2 business days.  For prescription refills, please ask your pharmacy to contact our office. Our fax number is [phone].  If you have an urgent issue when the clinic is closed that cannot wait until the next business day, you can page your doctor at the number below.    Please note that while we do our best to be available for urgent issues outside of office hours, we are not  available 24/7.   If you have an urgent issue and are unable to reach [place], you may choose to seek medical care at your doctor's office, retail clinic, urgent care center, or emergency room.  If you have a medical emergency, please immediately call 911 or go to the emergency department.  Pager Numbers  - Dr. Gwen Pounds: [phone]  - Dr. Roseanne Reno: [phone]  - Dr. Katrinka Blazing: [phone]   In the event of inclement weather, please call our main line at [phone] for an update on the status of any delays or closures.  Dermatology Medication Tips: Please keep the boxes that topical medications come in in order to help keep track of the instructions about where and how to use these. Pharmacies typically print the medication instructions only on the boxes and not directly on the medication tubes.   If your medication is too expensive, please contact our office at [phone] option 4 or send [place] a message through MyChart.   We are unable to tell what your co-pay for medications will be in advance as this is different depending on your insurance coverage. However, we may be able to find a substitute medication at lower cost or fill out paperwork to get insurance to cover a needed medication.   If a prior authorization is required to get your medication covered by your insurance company, please allow [place] 1-2 business days to complete this process.  Drug prices often vary depending on where the prescription is filled and some pharmacies may offer cheaper prices.  The website www.goodrx.com contains coupons for medications through different pharmacies. The prices here do not account for what the cost may be with help from insurance (it may be cheaper with your insurance), but the website can give you the price if you did not use any insurance.  - You can print the associated coupon and take it with your prescription to the pharmacy.  - You may also stop by our office during regular business hours  and pick up a GoodRx coupon card.  - If you need your prescription sent electronically to a different pharmacy, notify our office through [facility] or by phone at [phone] option 4.     Si Usted Necesita Algo Despus de Su Visita  Tambin puede enviarnos un mensaje a travs de Clinical cytogeneticist. Por lo general respondemos a los mensajes de MyChart en el transcurso de 1 a 2 das hbiles.  Para renovar recetas, por favor pida a su farmacia que se ponga en contacto con nuestra oficina. Annie Sable de fax es [place] [phone].  Si tiene un asunto urgente cuando la clnica est cerrada y que no puede esperar hasta el siguiente da hbil, puede llamar/localizar a su doctor(a) al nmero que aparece a continuacin.   Por favor, tenga en cuenta que aunque hacemos todo lo posible para estar disponibles para asuntos urgentes fuera del horario  de oficina, no estamos disponibles las 24 horas del da, los 7 [address]  [address] de la [place].   Si tiene un problema urgente y no puede comunicarse con nosotros, puede optar por buscar atencin mdica  en el consultorio de su doctor(a), en una clnica privada, en un centro de atencin urgente o en una sala de emergencias.  Si tiene Engineer, drilling, por favor llame inmediatamente al 911 o vaya a la sala de emergencias.  Nmeros de bper  - Dr. Gwen Pounds: [phone]  - Dra. Roseanne Reno: [phone]  - Dr. Katrinka Blazing: [phone]   En caso de inclemencias del tiempo, por favor llame a Lacy Duverney principal al [phone] para una actualizacin sobre el [place] de cualquier retraso o cierre.  Consejos para la medicacin en dermatologa: Por favor, guarde las cajas en las que vienen los medicamentos de uso tpico para ayudarle a seguir las instrucciones sobre dnde y cmo usarlos. Las farmacias generalmente imprimen las instrucciones del medicamento slo en las cajas y no directamente en los tubos del [place].   Si su medicamento es muy caro, por favor, pngase  en contacto con Rolm Gala llamando al [phone] y presione la opcin 4 o envenos un mensaje a travs de Clinical cytogeneticist.   No podemos decirle cul ser su copago por los medicamentos por adelantado ya que esto es diferente dependiendo de la cobertura de su seguro. Sin embargo, es posible que podamos encontrar un medicamento sustituto a Audiological scientist un formulario para que el seguro cubra el medicamento que se considera necesario.   Si se requiere una autorizacin previa para que su compaa de seguros [place] su medicamento, por favor permtanos de 1 a 2 das hbiles para completar [address]  Los precios de los medicamentos varan con frecuencia dependiendo del Environmental consultant de dnde se surte la receta y alguna farmacias pueden ofrecer precios ms baratos.  El sitio web www.goodrx.com tiene cupones para medicamentos de Health and safety inspector. Los precios aqu no tienen en cuenta lo que podra costar con la ayuda del seguro (puede ser ms barato con su seguro), pero el sitio web puede darle el precio si no utiliz Tourist information centre manager.  - Puede imprimir el cupn correspondiente y llevarlo con su receta a la farmacia.  - Tambin puede pasar por nuestra oficina durante el horario de atencin regular y Education officer, museum una tarjeta de cupones de GoodRx.  - Si necesita que su receta se enve electrnicamente a una farmacia diferente, informe a nuestra oficina a travs de MyChart de [place] o por telfono llamando al [phone] y presione la opcin 4.

## 2023-10-12 NOTE — Progress Notes (Unsigned)
 Follow-Up Visit   Subjective  Holly Pineda is a 43 y.o. female who presents for the following: Skin Cancer Screening and Full Body Skin Exam. Pt wanting dry patches at face looked at. She has a couple irritating growths on her neck and shoulder.  Patient with hx BCC and dysplastic nevi.  The patient presents for Total-Body Skin Exam (TBSE) for skin cancer screening and mole check. The patient has spots, moles and lesions to be evaluated, some may be new or changing and the patient may have concern these could be cancer.   The following portions of the chart were reviewed this encounter and updated as appropriate: medications, allergies, medical history  Review of Systems:  No other skin or systemic complaints except as noted in HPI or Assessment and Plan.  Objective  Well appearing patient in no apparent distress; mood and affect are within normal limits.  A full examination was performed including scalp, head, eyes, ears, nose, lips, neck, chest, axillae, abdomen, back, buttocks, bilateral upper extremities, bilateral lower extremities, hands, feet, fingers, toes, fingernails, and toenails. All findings within normal limits unless otherwise noted below.   Relevant physical exam findings are noted in the Assessment and Plan.  L posterior shoulder x1, R lower neck x1 (2) Stuck on waxy paps with erythema  Assessment & Plan   SKIN CANCER SCREENING PERFORMED TODAY.  ACTINIC DAMAGE - Chronic condition, secondary to cumulative UV/sun exposure - diffuse scaly erythematous macules with underlying dyspigmentation - Recommend daily broad spectrum sunscreen SPF 30+ to sun-exposed areas, reapply every 2 hours as needed. Sample of Elta MD daily moisturizer tinted SPF 40 and Clear SPF 46 given.  - Staying in the shade or wearing long sleeves, sun glasses (UVA+UVB protection) and wide brim hats (4-inch brim around the entire circumference of the hat) are also recommended for sun  protection.  - Call for new or changing lesions.  LENTIGINES, SEBORRHEIC KERATOSES, HEMANGIOMAS - Benign normal skin lesions - Benign-appearing - Call for any changes  SEBORRHEIC KERATOSIS - Stuck-on, waxy, tan-brown papules - Benign-appearing - Discussed benign etiology and prognosis. - Observe - Call for any changes  HEMANGIOMA Exam: red papule(s) Discussed benign nature. Recommend observation. Call for changes.  MELANOCYTIC NEVI - Tan-brown and/or pink-flesh-colored symmetric macules and papules - Benign appearing on exam today - Observation - Call clinic for new or changing moles - Recommend daily use of broad spectrum spf 30+ sunscreen to sun-exposed areas.  -Irritated nevi at L anterior neck and L posterior shoulder that patient would like to come back for shave removal.   History of Basal Cell Carcinoma of the Skin L sternum mid chest 2015 - No evidence of recurrence today - Recommend regular full body skin exams - Recommend daily broad spectrum sunscreen SPF 30+ to sun-exposed areas, reapply every 2 hours as needed.  - Call if any new or changing lesions are noted between office visits   History of Dysplastic Nevi, multiple - No evidence of recurrence today - Recommend regular full body skin exams - Recommend daily broad spectrum sunscreen SPF 30+ to sun-exposed areas, reapply every 2 hours as needed.  - Call if any new or changing lesions are noted between office visits   SEBORRHEIC DERMATITIS Exam: Mild scaling at glabella.  Chronic and persistent condition with duration or expected duration over one year. Condition is symptomatic/ bothersome to patient. Not currently at goal.   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  Seborrheic Dermatitis is a chronic persistent rash characterized by pinkness and scaling most commonly of the mid face but also can occur on the scalp  (dandruff), ears; mid chest, mid back and groin.  It tends to be exacerbated by stress and cooler weather.  People who have neurologic disease may experience new onset or exacerbation of existing seborrheic dermatitis.  The condition is not curable but treatable and can be controlled.  Treatment Plan: Use Ketoconazole 2% cream, apply once to twice a day as needed  FACIAL ELASTOSIS Exam: Rhytides and volume loss face, infraocular area  Treatment Plan: Pt interested in under eye filler, recommend she see Dr. Kathyrn Sheriff or Dr. Sedalia Muta for treatment.  Recommend daily broad spectrum sunscreen SPF 30+ to sun-exposed areas, reapply every 2 hours as needed. Call for new or changing lesions.  Staying in the shade or wearing long sleeves, sun glasses (UVA+UVB protection) and wide brim hats (4-inch brim around the entire circumference of the hat) are also recommended for sun protection.   INFLAMED SEBORRHEIC KERATOSIS (2) L posterior shoulder x1, R lower neck x1 (2) Symptomatic, irritating, patient would like treated. Destruction of lesion - L posterior shoulder x1, R lower neck x1 (2)  Destruction method: cryotherapy   Informed consent: discussed and consent obtained   Lesion destroyed using liquid nitrogen: Yes   Region frozen until ice ball extended beyond lesion: Yes   Outcome: patient tolerated procedure well with no complications   Post-procedure details: wound care instructions given   Additional details:  Prior to procedure, discussed risks of blister formation, small wound, skin dyspigmentation, or rare scar following cryotherapy. Recommend Vaseline ointment to treated areas while healing.  Return for w/ Dr. Roseanne Reno for shave removal of irritated nevi at neck & shoulder, hair thinning.  I, Soundra Pilon, CMA, am acting as scribe for Willeen Niece, MD   Documentation: I have reviewed the above documentation for accuracy and completeness, and I agree with the above.  Willeen Niece, MD

## 2023-10-21 ENCOUNTER — Ambulatory Visit
Admission: RE | Admit: 2023-10-21 | Discharge: 2023-10-21 | Disposition: A | Discharge status: Home / Self Care | Payer: BC Managed Care – PPO | Source: Ambulatory Visit | Attending: Obstetrics and Gynecology | Admitting: Obstetrics and Gynecology

## 2023-10-21 DIAGNOSIS — Z1231 Encounter for screening mammogram for malignant neoplasm of breast: Secondary | ICD-10-CM | POA: Insufficient documentation

## 2023-10-26 ENCOUNTER — Ambulatory Visit: Payer: Self-pay | Admitting: Oncology

## 2023-11-03 ENCOUNTER — Ambulatory Visit: Payer: Self-pay | Admitting: Adult Health

## 2023-11-09 ENCOUNTER — Ambulatory Visit: Payer: Self-pay | Admitting: Oncology

## 2023-11-20 IMAGING — MG DIGITAL SCREENING BREAST BILAT IMPLANT W/ TOMO W/ CAD
9 of 16 series · 9 of 40 positions shown · non-contrast
Comparison: Previous exam(s).

CLINICAL DATA: Screening.

EXAM:
DIGITAL SCREENING BILATERAL MAMMOGRAM WITH IMPLANTS, CAD AND
TOMOSYNTHESIS
TECHNIQUE: Bilateral screening digital craniocaudal and mediolateral oblique
mammograms were obtained. Bilateral screening digital breast
tomosynthesis was performed. The images were evaluated with
computer-aided detection. Standard and/or implant displaced views
were performed.

[R MLO]
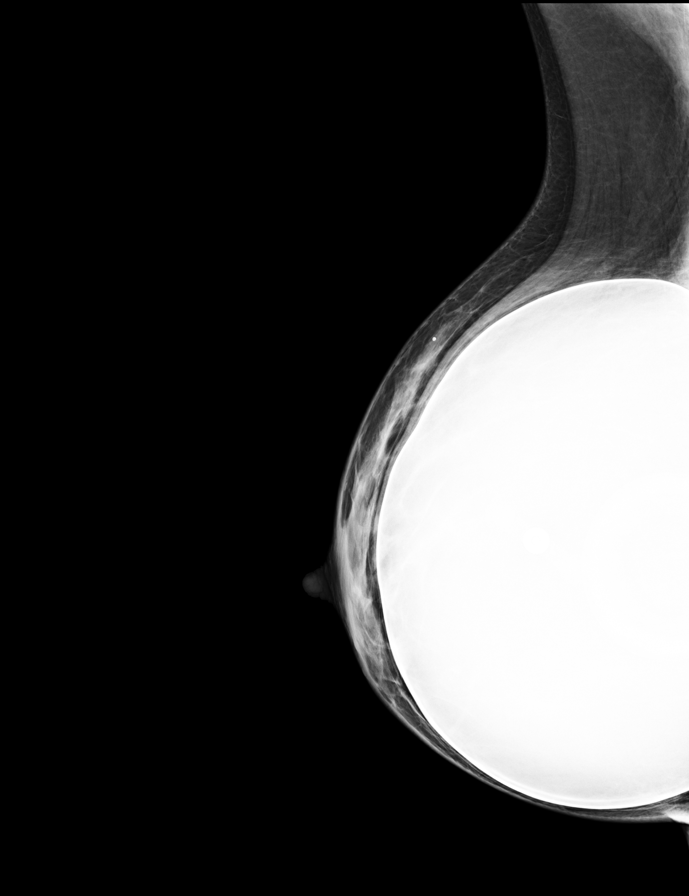

[L MLO]
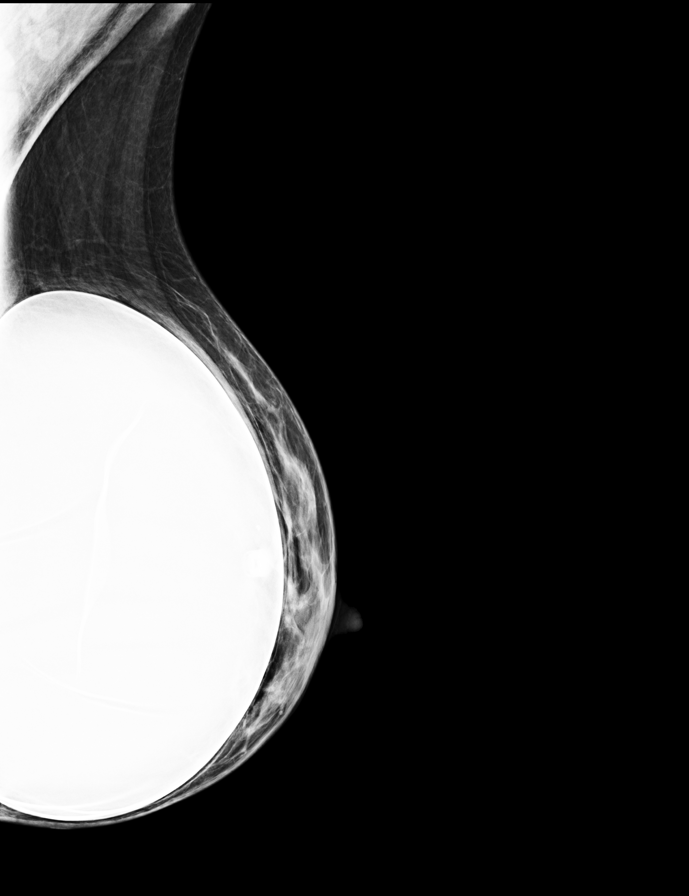

[L CC]
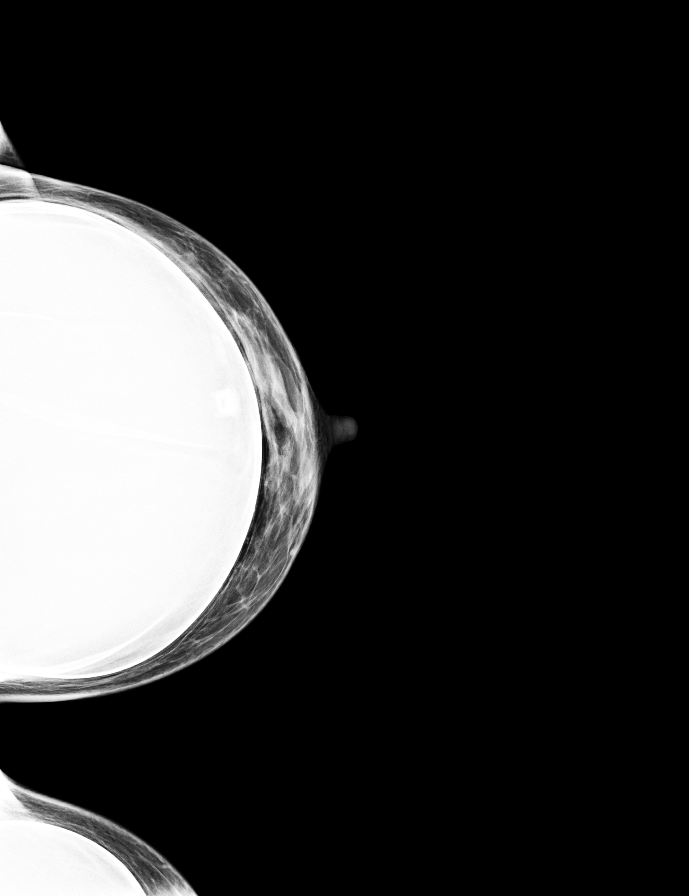

[R CC]
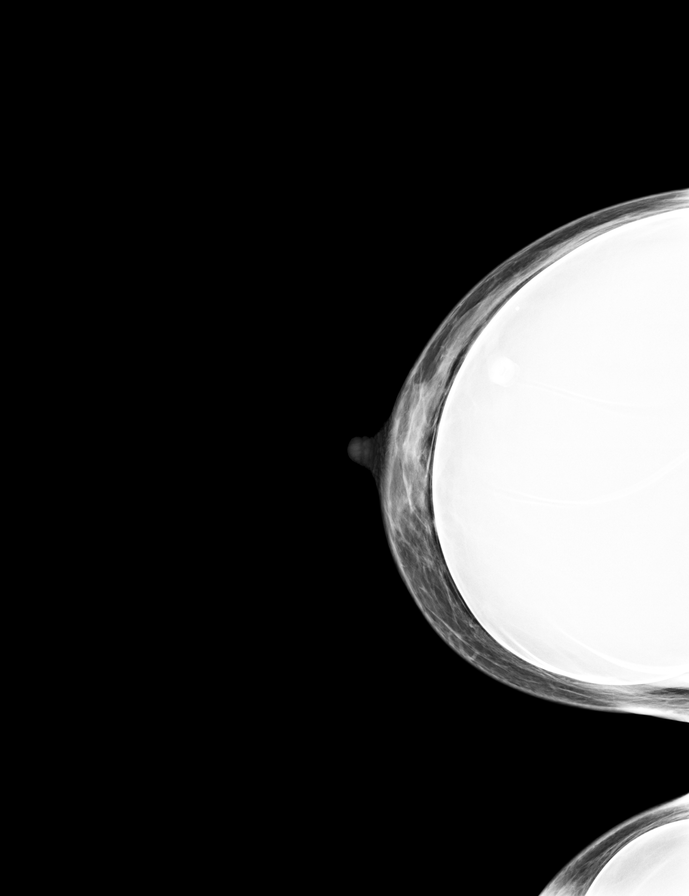

[L MLO synth-2D (1 of 2)]
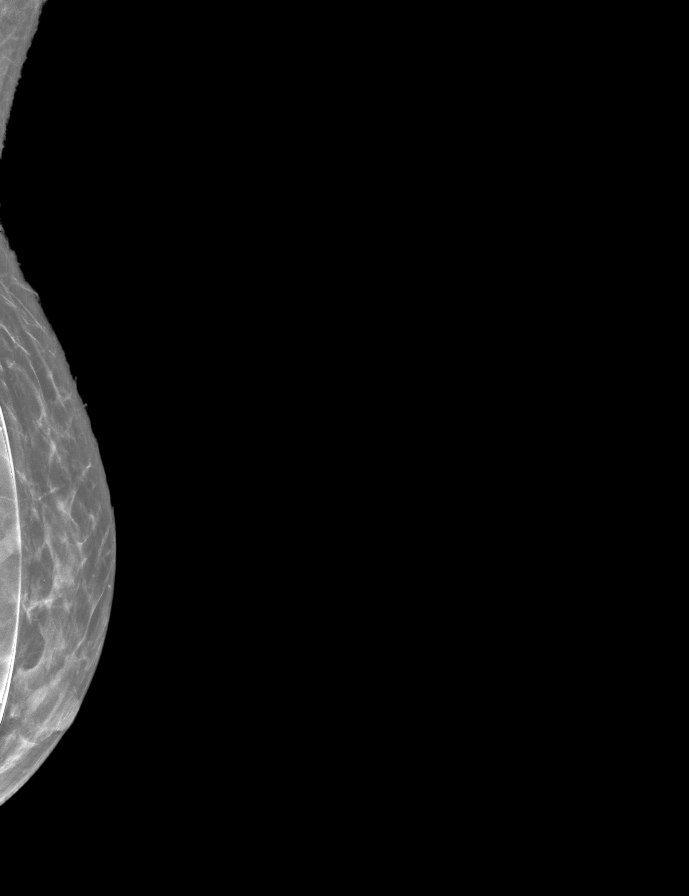

[L MLO synth-2D (2 of 2)]
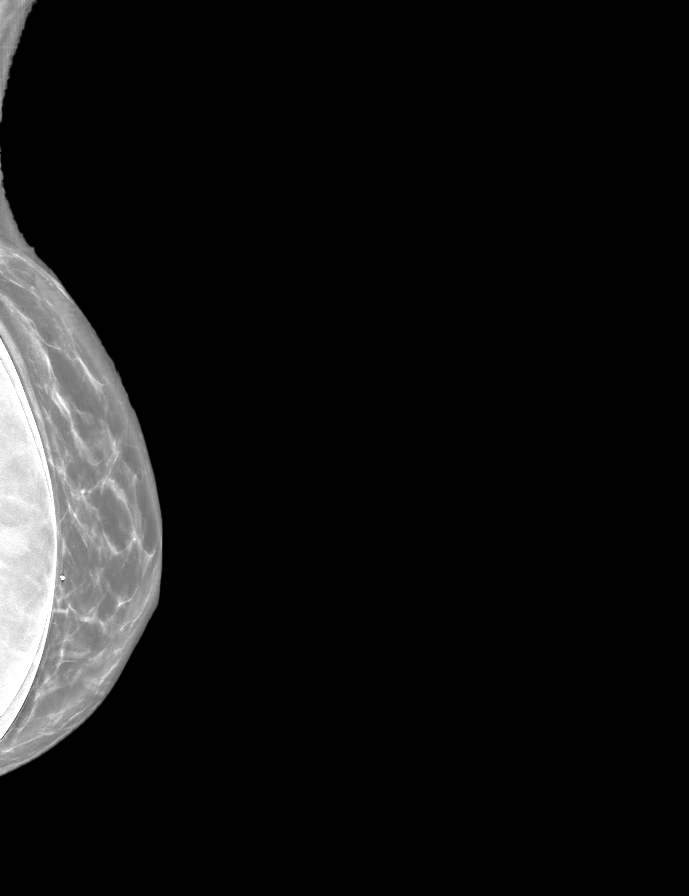

[L CC synth-2D]
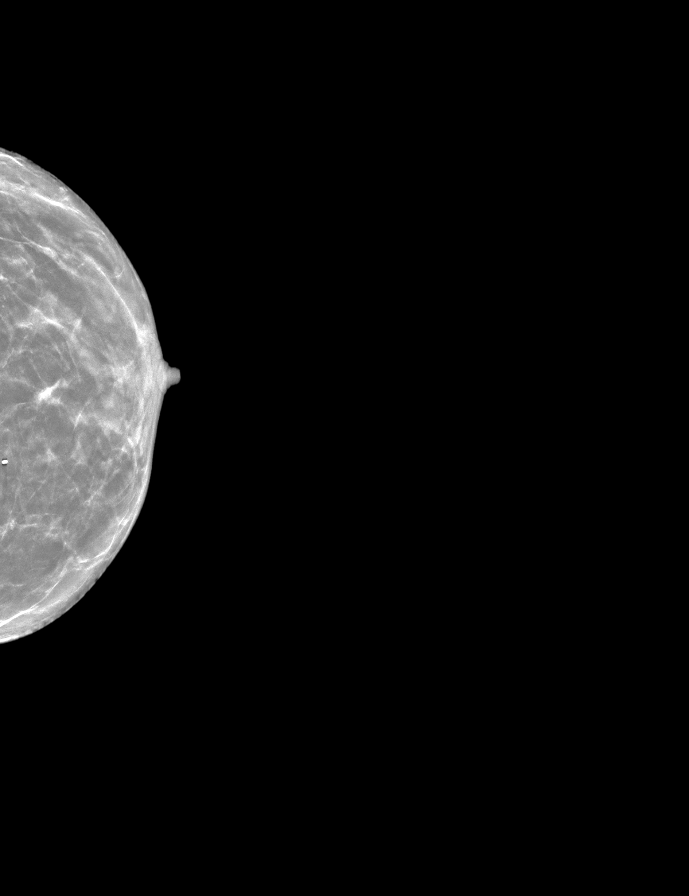

[R CC synth-2D]
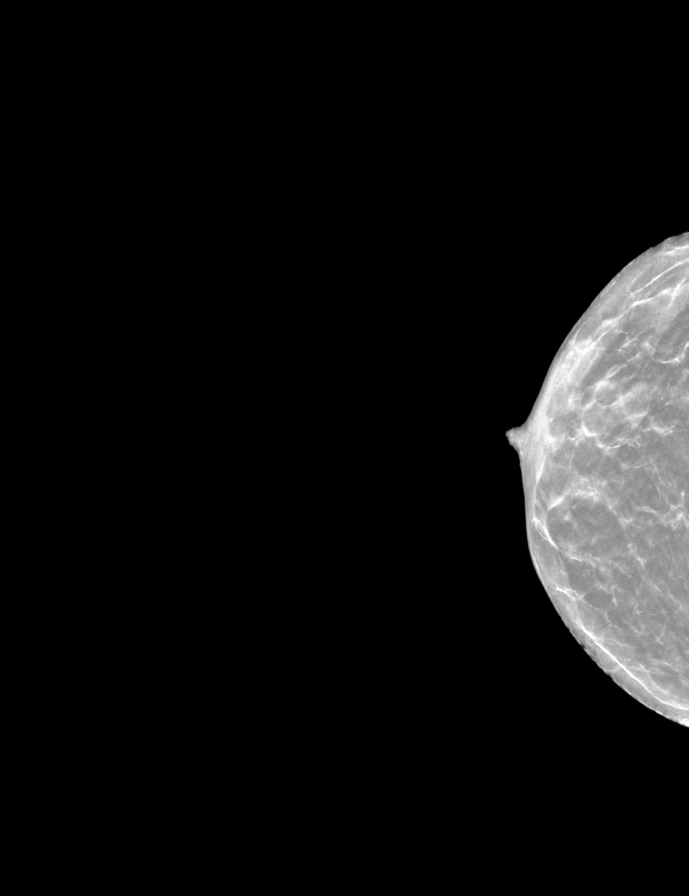

[R MLO synth-2D]
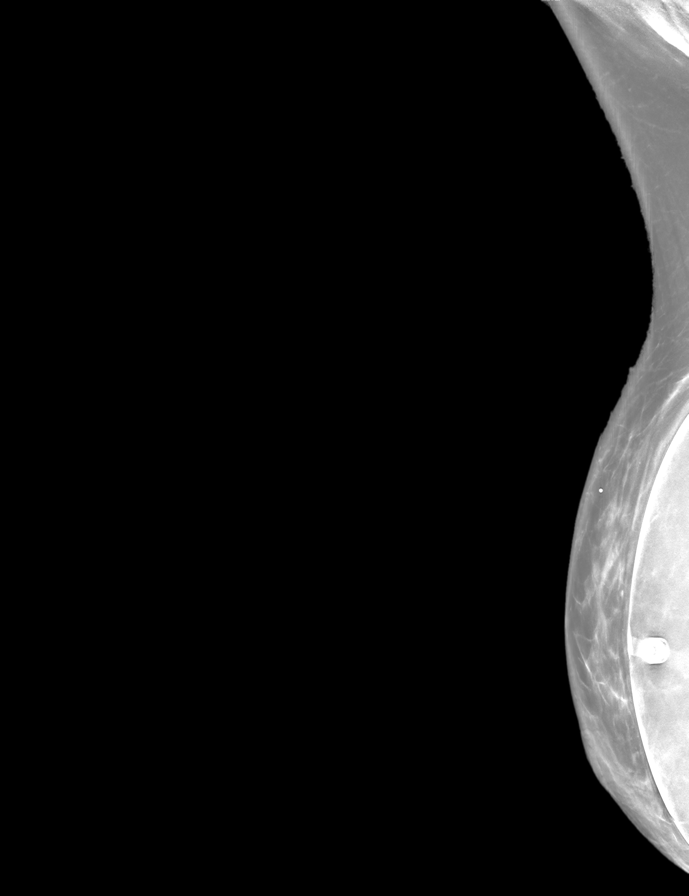

[9 of 40 positions shown; findings below may reference images not displayed]

ACR Breast Density Category c: The breast tissue is heterogeneously
dense, which may obscure small masses.
FINDINGS: The patient has retropectoral implants. There are no findings
suspicious for malignancy.
IMPRESSION: No mammographic evidence of malignancy. A result letter of this
screening mammogram will be mailed directly to the patient.

RECOMMENDATION:
Screening mammogram in one year. (Code:LT-E-7TH)

BI-RADS CATEGORY  1:  Negative.

## 2023-11-23 ENCOUNTER — Other Ambulatory Visit: Payer: Self-pay

## 2023-11-23 DIAGNOSIS — E039 Hypothyroidism, unspecified: Secondary | ICD-10-CM

## 2023-11-25 LAB — T4, FREE: Free T4: 1.74 ng/dL (ref 0.82–1.77)

## 2023-11-25 LAB — TSH: TSH: 3.43 u[IU]/mL (ref 0.450–4.500)

## 2023-11-26 DIAGNOSIS — R4589 Other symptoms and signs involving emotional state: Secondary | ICD-10-CM | POA: Diagnosis not present

## 2023-11-26 DIAGNOSIS — E041 Nontoxic single thyroid nodule: Secondary | ICD-10-CM | POA: Diagnosis not present

## 2023-11-26 DIAGNOSIS — E039 Hypothyroidism, unspecified: Secondary | ICD-10-CM | POA: Diagnosis not present

## 2023-11-26 DIAGNOSIS — E063 Autoimmune thyroiditis: Secondary | ICD-10-CM | POA: Diagnosis not present

## 2023-12-21 ENCOUNTER — Encounter: Payer: Self-pay | Admitting: Oncology

## 2023-12-21 ENCOUNTER — Other Ambulatory Visit: Payer: Self-pay

## 2023-12-21 ENCOUNTER — Ambulatory Visit (INDEPENDENT_AMBULATORY_CARE_PROVIDER_SITE_OTHER): Payer: Self-pay | Admitting: Oncology

## 2023-12-21 VITALS — BP 115/70 | HR 76 | Temp 96.9°F | Ht 59.0 in | Wt 103.0 lb

## 2023-12-21 DIAGNOSIS — N898 Other specified noninflammatory disorders of vagina: Secondary | ICD-10-CM

## 2023-12-21 DIAGNOSIS — E039 Hypothyroidism, unspecified: Secondary | ICD-10-CM

## 2023-12-21 DIAGNOSIS — J029 Acute pharyngitis, unspecified: Secondary | ICD-10-CM

## 2023-12-21 NOTE — Progress Notes (Signed)
 [facility]  [address] [place], [place] [address] Phone: [phone] Fax: [phone]   Office Visit Note  Patient Name: Holly Pineda  Date of [id number]  Med Rec number [id number]  Date of Service: 12/21/2023  Chlorhexidine  Chief Complaint  Patient presents with   Sore Throat    Patient states she had a sore throat yesterday but it feels better today.     HPI Patient is an 43 y.o. here for throat pain. States symptoms started last night.  Reports symptoms felt better this morning but wanted to get it checked out.  Thought she may have felt some lymph nodes around her neck.  She has used azelastine  nasal spray in the past but has not used it recently.  She does not consistently take allergy medication.  No sick contacts.  No fevers.  Reports she also has a new sexual partner and would like STI testing.  She will complete her third HPV vaccine this next Saturday.  Reports she uses protection every time.  Has not been tested in a while.  Reports when she wiped yesterday she noticed a slight discoloration with brown looking vaginal discharge.  She has not seen it since.  No other symptoms.  Current Medication:  Outpatient Encounter Medications as of 12/21/2023  Medication Sig   azelastine  (ASTELIN ) 0.1 % nasal spray Place 2 sprays into both nostrils 2 (two) times daily. Use in each nostril as directed   fluticasone (FLONASE) 50 MCG/ACT nasal spray    ketoconazole  (NIZORAL ) 2 % cream Apply once to twice a day as needed to aa.   levothyroxine  (SYNTHROID ) 112 MCG tablet Take 1 tablet by mouth daily.   Magnesium Glycinate 120 MG CAPS Take by mouth.   Multiple Vitamin (MULTIVITAMIN) capsule Take 1 capsule by mouth daily.   OMEGA-3-ACID ETHYL ESTERS & D3 PO Take by mouth.   traZODone  (DESYREL ) 100 MG tablet TAKE 1 TABLET BY MOUTH AT BEDTIME AS NEEDED FOR SLEEP.   [DISCONTINUED] ascorbic acid (VITAMIN C) 250 MG CHEW Chew by mouth.   [DISCONTINUED]  ergocalciferol , VITAMIN D2, (DRISDOL) 200 MCG/ML drops Take by mouth.   [DISCONTINUED] levothyroxine  (SYNTHROID ) 88 MCG tablet Take 100 mcg by mouth daily before breakfast.   No facility-administered encounter medications on file as of 12/21/2023.      Medical History: Past Medical History:  Diagnosis Date   Allergy    Annual physical exam 06/06/2018   Anxiety 02/13/2019   Atypical mole 06/05/2015   right lower back/mild, right lateral abdomen/mild   Atypical mole 08/20/2015   right upper back paraspinal/excision   Atypical mole 08/27/2015   right superior buttock/excision   Atypical mole 02/24/2016   right inframammary/mild   Atypical mole 10/21/2017   left lower back/moderate   Basal cell carcinoma 09/04/2013   left sternum mid chest   Goiter 02/19/2022     Vital Signs: BP 115/70   Pulse 76   Temp (!) 96.9 F (36.1 C)   Ht 4\' 11"  (1.499 m)   Wt 103 lb (46.7 kg)   SpO2 98%   BMI 20.80 kg/m   ROS: As per HPI.  All other pertinent ROS negative.     Review of Systems  Constitutional:  Negative for fatigue and fever.  HENT:  Positive for congestion, postnasal drip and sore throat. Negative for ear pain, sinus pressure, sinus pain and trouble swallowing.   Respiratory:  Negative for cough.   Genitourinary:  Positive for vaginal discharge. Negative for  decreased urine volume, menstrual problem, vaginal bleeding and vaginal pain.  Musculoskeletal:  Negative for myalgias.  Neurological:  Negative for dizziness and headaches.    Physical Exam Constitutional:      Appearance: She is well-developed.  HENT:     Right Ear: A middle ear effusion is present.     Left Ear: A middle ear effusion is present.     Nose: Congestion and rhinorrhea present.     Right Turbinates: Not swollen.     Left Turbinates: Not swollen.     Right Sinus: No maxillary sinus tenderness or frontal sinus tenderness.     Left Sinus: No maxillary sinus tenderness or frontal sinus tenderness.      Mouth/Throat:     Pharynx: Postnasal drip present. No posterior oropharyngeal erythema.     Tonsils: 0 on the right. 0 on the left.  Cardiovascular:     Rate and Rhythm: Normal rate and regular rhythm.  Pulmonary:     Effort: Pulmonary effort is normal.     Breath sounds: Normal breath sounds.  Lymphadenopathy:     Head:     Right side of head: Submandibular adenopathy present.     Left side of head: Submandibular adenopathy present.     Cervical: Cervical adenopathy present.     Right cervical: Superficial cervical adenopathy present.     Left cervical: Superficial cervical adenopathy present.  Neurological:     Mental Status: She is alert.     No results found for this or any previous visit (from the past 24 hours).  Assessment/Plan: 1. Vaginal discharge (Primary) STI testing today. 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 Will send results via MyChart when available. We discussed HIV and syphilis testing in the future but she would like to wait given she has a new sexual partner.  - Chlamydia/Gonococcus/Trichomonas, NAA  2. Sore throat Likely secondary to postnasal drip.  Mild submandibular lymphadenopathy although nontender on exam.  Likely something viral or environmental allergies.  We discussed trying OTC antihistamine such as Allegra  or Claritin along with azelastine  nasal spray.  Keep me updated on your symptoms.  General Counseling: natoya viscomi understanding of the findings of todays visit and agrees with plan of treatment. I have discussed any further diagnostic evaluation that may be needed or ordered today. We also reviewed her medications today. she has been encouraged to call the office with any questions or concerns that should arise related to todays visit.   Orders Placed This Encounter  Procedures   Chlamydia/Gonococcus/Trichomonas, NAA    No orders of the defined types  were placed in this encounter.   I spent 20 minutes dedicated to the care of this patient (face-to-face and non-face-to-face) on the date of the encounter to include what is described in the assessment and plan.   Charlton Cooler, NP 12/21/2023 12:35 PM

## 2023-12-23 ENCOUNTER — Ambulatory Visit: Payer: Self-pay | Admitting: Oncology

## 2023-12-23 LAB — CHLAMYDIA/GONOCOCCUS/TRICHOMONAS, NAA
Chlamydia by NAA: NEGATIVE
Gonococcus by NAA: NEGATIVE
Trich vag by NAA: NEGATIVE

## 2023-12-28 ENCOUNTER — Ambulatory Visit (INDEPENDENT_AMBULATORY_CARE_PROVIDER_SITE_OTHER): Payer: Self-pay | Admitting: Oncology

## 2023-12-28 DIAGNOSIS — E039 Hypothyroidism, unspecified: Secondary | ICD-10-CM

## 2023-12-29 ENCOUNTER — Encounter: Payer: Self-pay | Admitting: Oncology

## 2023-12-30 ENCOUNTER — Ambulatory Visit: Payer: Self-pay | Admitting: Adult Health

## 2023-12-30 ENCOUNTER — Encounter: Payer: Self-pay | Admitting: Adult Health

## 2023-12-30 VITALS — BP 100/70 | HR 97 | Temp 99.2°F

## 2023-12-30 DIAGNOSIS — B3731 Acute candidiasis of vulva and vagina: Secondary | ICD-10-CM

## 2023-12-30 LAB — T4, FREE: Free T4: 1.63 ng/dL (ref 0.82–1.77)

## 2023-12-30 LAB — TSH: TSH: 2.07 u[IU]/mL (ref 0.450–4.500)

## 2023-12-30 MED ORDER — FLUCONAZOLE 150 MG PO TABS: ORAL_TABLET | ORAL | 0 refills | Status: DC

## 2023-12-30 NOTE — Progress Notes (Signed)
 Lab only

## 2023-12-30 NOTE — Progress Notes (Signed)
 [facility] [address] [place], [place] [address]   Office Visit Note  Patient Name: Holly Pineda Date of Birth [id number]  Medical Record number [id number]  Date of Service: 12/30/2023  Chief Complaint  Patient presents with   Vaginitis     HPI Pt is here for a sick visit. She reports she thinks she has a yeast infection.  She recently had STI testing, which was all negative. She describes irritation, cottage cheese like discharge.     Current Medication:  Outpatient Encounter Medications as of 12/30/2023  Medication Sig   azelastine  (ASTELIN ) 0.1 % nasal spray Place 2 sprays into both nostrils 2 (two) times daily. Use in each nostril as directed   fluticasone (FLONASE) 50 MCG/ACT nasal spray    ketoconazole  (NIZORAL ) 2 % cream Apply once to twice a day as needed to aa.   levothyroxine  (SYNTHROID ) 112 MCG tablet Take 1 tablet by mouth daily.   Magnesium Glycinate 120 MG CAPS Take by mouth.   Multiple Vitamin (MULTIVITAMIN) capsule Take 1 capsule by mouth daily.   OMEGA-3-ACID ETHYL ESTERS & D3 PO Take by mouth.   traZODone  (DESYREL ) 100 MG tablet TAKE 1 TABLET BY MOUTH AT BEDTIME AS NEEDED FOR SLEEP.   No facility-administered encounter medications on file as of 12/30/2023.      Medical History: Past Medical History:  Diagnosis Date   Allergy    Annual physical exam 06/06/2018   Anxiety 02/13/2019   Atypical mole 06/05/2015   right lower back/mild, right lateral abdomen/mild   Atypical mole 08/20/2015   right upper back paraspinal/excision   Atypical mole 08/27/2015   right superior buttock/excision   Atypical mole 02/24/2016   right inframammary/mild   Atypical mole 10/21/2017   left lower back/moderate   Basal cell carcinoma 09/04/2013   left sternum mid chest   Goiter 02/19/2022     Vital Signs: BP 100/70   Pulse 97   Temp 99.2 F (37.3 C)   SpO2 99%    Review of Systems  Constitutional:  Negative for chills, fatigue and  fever.  Genitourinary:  Positive for vaginal discharge. Negative for difficulty urinating, dysuria, genital sores, hematuria and vaginal bleeding.    Physical Exam Vitals reviewed.  Constitutional:      Appearance: Normal appearance.  HENT:     Head: Normocephalic.     Right Ear: Tympanic membrane normal.     Left Ear: Tympanic membrane normal.  Neurological:     Mental Status: She is alert.    Assessment/Plan: 1. Vaginal candida (Primary) Take Diflucan  as discussed.  - fluconazole  (DIFLUCAN ) 150 MG tablet; Take one tablet by mouth today.  May repeat in 5 days if symptoms persist  Dispense: 2 tablet; Refill: 0     General Counseling: Holly Pineda verbalizes understanding of the findings of todays visit and agrees with plan of treatment. I have discussed any further diagnostic evaluation that may be needed or ordered today. We also reviewed her medications today. she has been encouraged to call the office with any questions or concerns that should arise related to todays visit.   No orders of the defined types were placed in this encounter.   No orders of the defined types were placed in this encounter.   Time spent:15 Minutes    Sheria Dills AGNP-C Nurse Practitioner

## 2024-01-04 ENCOUNTER — Ambulatory Visit: Admitting: Dermatology

## 2024-01-04 DIAGNOSIS — D225 Melanocytic nevi of trunk: Secondary | ICD-10-CM

## 2024-01-04 DIAGNOSIS — L820 Inflamed seborrheic keratosis: Secondary | ICD-10-CM | POA: Diagnosis not present

## 2024-01-04 DIAGNOSIS — L219 Seborrheic dermatitis, unspecified: Secondary | ICD-10-CM | POA: Diagnosis not present

## 2024-01-04 DIAGNOSIS — D224 Melanocytic nevi of scalp and neck: Secondary | ICD-10-CM | POA: Diagnosis not present

## 2024-01-04 DIAGNOSIS — D2262 Melanocytic nevi of left upper limb, including shoulder: Secondary | ICD-10-CM | POA: Diagnosis not present

## 2024-01-04 DIAGNOSIS — D229 Melanocytic nevi, unspecified: Secondary | ICD-10-CM | POA: Diagnosis not present

## 2024-01-04 DIAGNOSIS — D485 Neoplasm of uncertain behavior of skin: Secondary | ICD-10-CM

## 2024-01-04 DIAGNOSIS — D492 Neoplasm of unspecified behavior of bone, soft tissue, and skin: Secondary | ICD-10-CM

## 2024-01-04 NOTE — Progress Notes (Signed)
 Follow-Up Visit   Subjective  Holly Pineda is a 43 y.o. female who presents for the following: Irritated nevi to be removed at left posterior shoulder and back at bra line and L neck. She also has a tag at right axilla she would like to discuss removal, bothersome.  She is also concerned about scaly rash behind ears.  The patient has spots, moles and lesions to be evaluated, some may be new or changing.     The following portions of the chart were reviewed this encounter and updated as appropriate: medications, allergies, medical history  Review of Systems:  No other skin or systemic complaints except as noted in HPI or Assessment and Plan.  Objective  Well appearing patient in no apparent distress; mood and affect are within normal limits.  A focused examination was performed of the following areas: Face, back, axilla  Relevant physical exam findings are noted in the Assessment and Plan.  L posterior shoulder 0.5 pink tan papule  R spinal mid back 0.6 cm pink flesh papule  L lateral neck 0.4 cm flesh papule  Right Axilla Erythematous stuck-on, waxy papule or plaque  Assessment & Plan   NEOPLASM OF UNCERTAIN BEHAVIOR OF SKIN (3) L posterior shoulder Epidermal / dermal shaving  Lesion diameter (cm):  0.5 Informed consent: discussed and consent obtained   Patient was prepped and draped in usual sterile fashion: Area prepped with alcohol. Anesthesia: the lesion was anesthetized in a standard fashion   Anesthetic:  1% lidocaine w/ epinephrine 1-100,000 buffered w/ 8.4% NaHCO3 Instrument used: flexible razor blade   Hemostasis achieved with: pressure, aluminum chloride and electrodesiccation   Outcome: patient tolerated procedure well   Post-procedure details: wound care instructions given   Post-procedure details comment:  Ointment and small bandage applied Specimen 1 - Surgical pathology Differential Diagnosis: Irritated Nevus vs other Check Margins:  No R spinal mid back Epidermal / dermal shaving  Lesion diameter (cm):  0.6 Informed consent: discussed and consent obtained   Patient was prepped and draped in usual sterile fashion: Area prepped with alcohol. Anesthesia: the lesion was anesthetized in a standard fashion   Anesthetic:  1% lidocaine w/ epinephrine 1-100,000 buffered w/ 8.4% NaHCO3 Instrument used: flexible razor blade   Hemostasis achieved with: pressure, aluminum chloride and electrodesiccation   Outcome: patient tolerated procedure well   Post-procedure details: wound care instructions given   Post-procedure details comment:  Ointment and small bandage applied Specimen 2 - Surgical pathology Differential Diagnosis: Irritated Nevus vs other Check Margins: No L lateral neck Epidermal / dermal shaving  Lesion diameter (cm):  0.4 Informed consent: discussed and consent obtained   Patient was prepped and draped in usual sterile fashion: Area prepped with alcohol. Anesthesia: the lesion was anesthetized in a standard fashion   Anesthetic:  1% lidocaine w/ epinephrine 1-100,000 buffered w/ 8.4% NaHCO3 Instrument used: flexible razor blade   Hemostasis achieved with: pressure, aluminum chloride and electrodesiccation   Outcome: patient tolerated procedure well   Post-procedure details: wound care instructions given   Post-procedure details comment:  Ointment and small bandage applied Specimen 3 - Surgical pathology Differential Diagnosis: Irritated Nevus vs other Check Margins: No Discussed resulting small scar with shave removal, and possible recurrence of lesion.  Recommend vaseline ointment to area daily and cover until healed.  Recommend photoprotection/sunscreen to area to prevent discoloration of scar.  Once healed, may apply OTC Serica scar gel bid to thickened scars.  INFLAMED SEBORRHEIC KERATOSIS Right Axilla Symptomatic, irritating,  patient would like treated. Destruction of lesion - Right  Axilla  Destruction method: cryotherapy   Informed consent: discussed and consent obtained   Lesion destroyed using liquid nitrogen: Yes   Region frozen until ice ball extended beyond lesion: Yes   Outcome: patient tolerated procedure well with no complications   Post-procedure details: wound care instructions given   Additional details:  Prior to procedure, discussed risks of blister formation, small wound, skin dyspigmentation, or rare scar following cryotherapy. Recommend Vaseline ointment to treated areas while healing.   MELANOCYTIC NEVI Exam: Tan-brown and/or pink-flesh-colored symmetric macules and papules  Treatment Plan: Benign appearing on exam today. Recommend observation. Call clinic for new or changing moles. Recommend daily use of broad spectrum spf 30+ sunscreen to sun-exposed areas.     SEBORRHEIC DERMATITIS Exam: mild erythema and scale post auricular crease, glabella, frontal hairline, alar crease  Chronic and persistent condition with duration or expected duration over one year. Condition is symptomatic/ bothersome to patient. Not currently at goal.   Seborrheic Dermatitis is a chronic persistent rash characterized by pinkness and scaling most commonly of the mid face but also can occur on the scalp (dandruff), ears; mid chest, mid back and groin.  It tends to be exacerbated by stress and cooler weather.  People who have neurologic disease may experience new onset or exacerbation of existing seborrheic dermatitis.  The condition is not curable but treatable and can be controlled.  Treatment Plan: Increase Ketoconazole  2% cream once to twice daily.    Return in about 9 months (around 10/03/2024) for TBSE.  IBernardine Bridegroom, CMA, am acting as scribe for Artemio Larry, MD .   Documentation: I have reviewed the above documentation for accuracy and completeness, and I agree with the above.  Artemio Larry, MD

## 2024-01-04 NOTE — Patient Instructions (Addendum)
 Discussed resulting small scar with shave removal, and possible recurrence of lesion.  Recommend vaseline ointment to area daily and cover until healed.  Recommend photoprotection/sunscreen to area to prevent discoloration of scar.  Once healed, may apply OTC Serica scar gel bid to thickened scars.    Wound Care Instructions  Cleanse wound gently with soap and water once a day then pat dry with clean gauze. Apply a thin coat of Petrolatum (petroleum jelly, "Vaseline") over the wound (unless you have an allergy to this). We recommend that you use a new, sterile tube of Vaseline. Do not pick or remove scabs. Do not remove the yellow or white "healing tissue" from the base of the wound.  Cover the wound with fresh, clean, nonstick gauze and secure with paper tape. You may use Band-Aids in place of gauze and tape if the wound is small enough, but would recommend trimming much of the tape off as there is often too much. Sometimes Band-Aids can irritate the skin.  You should call the office for your biopsy report after 1 week if you have not already been contacted.  If you experience any problems, such as abnormal amounts of bleeding, swelling, significant bruising, significant pain, or evidence of infection, please call the office immediately.  FOR ADULT SURGERY PATIENTS: If you need something for pain relief you may take 1 extra strength Tylenol (acetaminophen) AND 2 Ibuprofen (200mg  each) together every 4 hours as needed for pain. (do not take these if you are allergic to them or if you have a reason you should not take them.) Typically, you may only need pain medication for 1 to 3 days.     Due to recent changes in healthcare laws, you may see results of your pathology and/or laboratory studies on MyChart before the doctors have had a chance to review them. We understand that in some cases there may be results that are confusing or concerning to you. Please understand that not all results are received  at the same time and often the doctors may need to interpret multiple results in order to provide you with the best plan of care or course of treatment. Therefore, we ask that you please give us  2 business days to thoroughly review all your results before contacting the office for clarification. Should we see a critical lab result, you will be contacted sooner.   If You Need Anything After Your Visit  If you have any questions or concerns for your doctor, please call our main line at [phone] and press option 4 to reach your doctor's medical assistant. If no one answers, please leave a voicemail as directed and we will return your call as soon as possible. Messages left after 4 pm will be answered the following business day.   You may also send us  a message via MyChart. We typically respond to MyChart messages within 1-2 business days.  For prescription refills, please ask your pharmacy to contact our office. Our fax number is [phone].  If you have an urgent issue when the clinic is closed that cannot wait until the next business day, you can page your doctor at the number below.    Please note that while we do our best to be available for urgent issues outside of office hours, we are not available 24/7.   If you have an urgent issue and are unable to reach us , you may choose to seek medical care at your doctor's office, retail clinic, urgent care center, or emergency  room.  If you have a medical emergency, please immediately call 911 or go to the emergency department.  Pager Numbers  - Dr. Bary Likes: [phone]  - Dr. Annette Barters: [phone]  - Dr. Felipe Horton: [phone]   In the event of inclement weather, please call our main line at [phone] for an update on the status of any delays or closures.  Dermatology Medication Tips: Please keep the boxes that topical medications come in in order to help keep track of the instructions about where and how to use these. Pharmacies  typically print the medication instructions only on the boxes and not directly on the medication tubes.   If your medication is too expensive, please contact our office at [phone] option 4 or send us  a message through MyChart.   We are unable to tell what your co-pay for medications will be in advance as this is different depending on your insurance coverage. However, we may be able to find a substitute medication at lower cost or fill out paperwork to get insurance to cover a needed medication.   If a prior authorization is required to get your medication covered by your insurance company, please allow us  1-2 business days to complete this process.  Drug prices often vary depending on where the prescription is filled and some pharmacies may offer cheaper prices.  The website www.goodrx.com contains coupons for medications through different pharmacies. The prices here do not account for what the cost may be with help from insurance (it may be cheaper with your insurance), but the website can give you the price if you did not use any insurance.  - You can print the associated coupon and take it with your prescription to the pharmacy.  - You may also stop by our office during regular business hours and pick up a GoodRx coupon card.  - If you need your prescription sent electronically to a different pharmacy, notify our office through [facility] or by phone at [phone] option 4.     Si Usted Necesita Algo Despus de Su Visita  Tambin puede enviarnos un mensaje a travs de Clinical cytogeneticist. Por lo general respondemos a los mensajes de MyChart en el transcurso de 1 a 2 das hbiles.  Para renovar recetas, por favor pida a su farmacia que se ponga en contacto con nuestra oficina. Franz Jacks de fax es [place] [phone].  Si tiene un asunto urgente cuando la clnica est cerrada y que no puede esperar hasta el siguiente da hbil, puede llamar/localizar a su doctor(a) al nmero que  aparece a continuacin.   Por favor, tenga en cuenta que aunque hacemos todo lo posible para estar disponibles para asuntos urgentes fuera del horario de [place], no estamos disponibles las 24 horas del da, los 7 [address]  [address] de la [place].   Si tiene un problema urgente y no puede comunicarse con nosotros, puede optar por buscar atencin mdica  en el consultorio de su doctor(a), en una clnica privada, en un centro de atencin urgente o en una sala de emergencias.  Si tiene Engineer, drilling, por favor llame inmediatamente al 911 o vaya a la sala de emergencias.  Nmeros de bper  - Dr. Bary Likes: [phone]  - Dra. Annette Barters: [phone]  - Dr. Felipe Horton: [phone]   En caso de inclemencias del tiempo, por favor llame a Lajuan Pila principal al [phone] para una actualizacin sobre el [place] de cualquier retraso o cierre.  Consejos para la medicacin en dermatologa: Por favor, guarde las [employer]  vienen los medicamentos de uso tpico para ayudarle a seguir las instrucciones sobre dnde y cmo usarlos. Las farmacias generalmente imprimen las instrucciones del medicamento slo en las cajas y no directamente en los tubos del [place].   Si su medicamento es muy caro, por favor, pngase en contacto con Bettyjane Brunet llamando al [phone] y presione la opcin 4 o envenos un mensaje a travs de Clinical cytogeneticist.   No podemos decirle cul ser su copago por los medicamentos por adelantado ya que esto es diferente dependiendo de la cobertura de su seguro. Sin embargo, es posible que podamos encontrar un medicamento sustituto a Audiological scientist un formulario para que el seguro cubra el medicamento que se considera necesario.   Si se requiere una autorizacin previa para que su compaa de seguros [place] su medicamento, por favor permtanos de 1 a 2 das hbiles para completar este proceso.  Los precios de los medicamentos varan con frecuencia dependiendo del Environmental consultant de dnde se surte  la receta y alguna farmacias pueden ofrecer precios ms baratos.  El sitio web www.goodrx.com tiene cupones para medicamentos de Health and safety inspector. Los precios aqu no tienen en cuenta lo que podra costar con la ayuda del seguro (puede ser ms barato con su seguro), pero el sitio web puede darle el precio si no utiliz Tourist information centre manager.  - Puede imprimir el cupn correspondiente y llevarlo con su receta a la farmacia.  - Tambin puede pasar por nuestra oficina durante el horario de atencin regular y Education officer, museum una tarjeta de cupones de GoodRx.  - Si necesita que su receta se enve electrnicamente a una farmacia diferente, informe a nuestra oficina a travs de MyChart de [place] o por telfono llamando al [phone] y presione la opcin 4.

## 2024-01-06 LAB — SURGICAL PATHOLOGY

## 2024-01-10 ENCOUNTER — Ambulatory Visit: Payer: Self-pay | Admitting: Dermatology

## 2024-01-10 NOTE — Telephone Encounter (Signed)
 Advised patient all 3 biopsies were benign and no further treatment needed.

## 2024-01-10 NOTE — Telephone Encounter (Signed)
-----   Message from Artemio Larry sent at 01/10/2024 12:55 PM EDT ----- 1. 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 Skin, R spinal mid back :       IRRITATED MELANOCYTIC NEVUS, INTRADERMAL TYPE WITH CONGENITAL PATTERN  3. Skin, L lateral neck :       IRRITATED MELANOCYTIC NEVUS, INTRADERMAL TYPE WITH CONGENITAL PATTERN    1-3 irritated benign moles- please call patient

## 2024-01-13 ENCOUNTER — Ambulatory Visit (INDEPENDENT_AMBULATORY_CARE_PROVIDER_SITE_OTHER): Payer: Self-pay | Admitting: Adult Health

## 2024-01-13 ENCOUNTER — Encounter: Payer: Self-pay | Admitting: Adult Health

## 2024-01-13 ENCOUNTER — Other Ambulatory Visit: Payer: Self-pay

## 2024-01-13 VITALS — HR 102 | Temp 99.1°F

## 2024-01-13 DIAGNOSIS — Z113 Encounter for screening for infections with a predominantly sexual mode of transmission: Secondary | ICD-10-CM

## 2024-01-13 DIAGNOSIS — A6000 Herpesviral infection of urogenital system, unspecified: Secondary | ICD-10-CM

## 2024-01-13 MED ORDER — VALACYCLOVIR HCL 1 G PO TABS: 1000.0000 mg | ORAL_TABLET | Freq: Two times a day (BID) | ORAL | 1 refills | Status: AC

## 2024-01-13 NOTE — Progress Notes (Signed)
 [facility] [address] [place], [place] [address]   Office Visit Note  Patient Name: Holly Pineda Date of Birth [id number]  Medical Record number [id number]  Date of Service: 01/13/2024  Chief Complaint  Patient presents with   Acute Visit    Follow up, states she is feeling better but would like further testing and had some questions     HPI Pt is here for visit requesting sti testing, and refill on valtrex .  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    Current Medication:  Outpatient Encounter Medications as of 01/13/2024  Medication Sig   valACYclovir  (VALTREX ) 1000 MG tablet Take 1 tablet (1,000 mg total) by mouth 2 (two) times daily for 20 days.   azelastine  (ASTELIN ) 0.1 % nasal spray Place 2 sprays into both nostrils 2 (two) times daily. Use in each nostril as directed   fluconazole  (DIFLUCAN ) 150 MG tablet Take one tablet by mouth today.  May repeat in 5 days if symptoms persist   fluticasone (FLONASE) 50 MCG/ACT nasal spray    ketoconazole  (NIZORAL ) 2 % cream Apply once to twice a day as needed to aa.   levothyroxine  (SYNTHROID ) 112 MCG tablet Take 1 tablet by mouth daily.   Magnesium Glycinate 120 MG CAPS Take by mouth.   Multiple Vitamin (MULTIVITAMIN) capsule Take 1 capsule by mouth daily.   OMEGA-3-ACID ETHYL ESTERS & D3 PO Take by mouth.   traZODone  (DESYREL ) 100 MG tablet TAKE 1 TABLET BY MOUTH AT BEDTIME AS NEEDED FOR SLEEP.   tretinoin  (RETIN-A ) 0.05 % cream Apply topically at bedtime.   No facility-administered encounter medications on file as of 01/13/2024.      Medical History: Past Medical History:  Diagnosis Date   Allergy    Annual physical exam 06/06/2018   Anxiety 02/13/2019   Atypical mole 06/05/2015   right lower back/mild, right lateral abdomen/mild   Atypical mole 08/20/2015   right upper back paraspinal/excision   Atypical mole 08/27/2015   right superior buttock/excision   Atypical mole 02/24/2016    right inframammary/mild   Atypical mole 10/21/2017   left lower back/moderate   Basal cell carcinoma 09/04/2013   left sternum mid chest   Goiter 02/19/2022     Vital Signs: Pulse (!) 102   Temp 99.1 F (37.3 C) (Tympanic)   SpO2 99%    Review of Systems  Constitutional:  Negative for chills, fatigue and fever.  Eyes:  Negative for itching.    Physical Exam Vitals reviewed.  Constitutional:      Appearance: Normal appearance.   Neurological:     Mental Status: She is alert.    Assessment/Plan: 1. Screening examination for venereal disease (Primary) Discussed available testing options.  Patient elected for Gonorrhea, Chlamydia, Trichomonas testing today.  Patient will be contacted via MyChart when labs results are available.  Encouraged consistent condom use, as well as regular testing (every 3-6 months) when changing sex partners, or when having multiple partners.    - Chlamydia/Gonococcus/Trichomonas, NAA  2. Genital herpes simplex, unspecified site Take Valtrex  as discussed.  - valACYclovir  (VALTREX ) 1000 MG tablet; Take 1 tablet (1,000 mg total) by mouth 2 (two) times daily for 20 days.  Dispense: 20 tablet; Refill: 1     General Counseling: Holly Pineda verbalizes understanding of the findings of todays visit and agrees with plan of treatment. I have discussed any further diagnostic evaluation that may be needed or ordered today. We also reviewed her  medications today. she has been encouraged to call the office with any questions or concerns that should arise related to todays visit.   Orders Placed This Encounter  Procedures   Chlamydia/Gonococcus/Trichomonas, NAA    Meds ordered this encounter  Medications   valACYclovir  (VALTREX ) 1000 MG tablet    Sig: Take 1 tablet (1,000 mg total) by mouth 2 (two) times daily for 20 days.    Dispense:  20 tablet    Refill:  1    Time spent:15 Minutes    Sheria Dills AGNP-C Nurse Practitioner

## 2024-01-17 ENCOUNTER — Ambulatory Visit: Payer: Self-pay | Admitting: Adult Health

## 2024-01-17 LAB — CHLAMYDIA/GONOCOCCUS/TRICHOMONAS, NAA
Chlamydia by NAA: NEGATIVE
Gonococcus by NAA: NEGATIVE
Trich vag by NAA: NEGATIVE

## 2024-03-10 DIAGNOSIS — R4589 Other symptoms and signs involving emotional state: Secondary | ICD-10-CM | POA: Diagnosis not present

## 2024-03-10 DIAGNOSIS — E063 Autoimmune thyroiditis: Secondary | ICD-10-CM | POA: Diagnosis not present

## 2024-03-10 DIAGNOSIS — E039 Hypothyroidism, unspecified: Secondary | ICD-10-CM | POA: Diagnosis not present

## 2024-03-13 DIAGNOSIS — K580 Irritable bowel syndrome with diarrhea: Secondary | ICD-10-CM | POA: Diagnosis not present

## 2024-03-13 DIAGNOSIS — K638219 Small intestinal bacterial overgrowth, unspecified: Secondary | ICD-10-CM | POA: Diagnosis not present

## 2024-03-24 ENCOUNTER — Other Ambulatory Visit: Payer: Self-pay

## 2024-03-24 DIAGNOSIS — E039 Hypothyroidism, unspecified: Secondary | ICD-10-CM

## 2024-03-25 LAB — TSH: TSH: 3.59 u[IU]/mL (ref 0.450–4.500)

## 2024-03-31 DIAGNOSIS — E039 Hypothyroidism, unspecified: Secondary | ICD-10-CM | POA: Diagnosis not present

## 2024-03-31 DIAGNOSIS — R4589 Other symptoms and signs involving emotional state: Secondary | ICD-10-CM | POA: Diagnosis not present

## 2024-04-21 ENCOUNTER — Other Ambulatory Visit: Payer: Self-pay

## 2024-04-21 DIAGNOSIS — E039 Hypothyroidism, unspecified: Secondary | ICD-10-CM

## 2024-04-24 LAB — TSH: TSH: 2.35 u[IU]/mL (ref 0.450–4.500)

## 2024-05-05 ENCOUNTER — Other Ambulatory Visit: Payer: Self-pay

## 2024-05-05 ENCOUNTER — Encounter: Payer: Self-pay | Admitting: Physician Assistant

## 2024-05-05 ENCOUNTER — Ambulatory Visit: Payer: Self-pay | Admitting: Physician Assistant

## 2024-05-05 VITALS — BP 110/58 | HR 100 | Temp 98.4°F | Ht 59.0 in | Wt 106.0 lb

## 2024-05-05 DIAGNOSIS — N926 Irregular menstruation, unspecified: Secondary | ICD-10-CM

## 2024-05-05 LAB — POCT URINE PREGNANCY: Preg Test, Ur: NEGATIVE

## 2024-05-05 NOTE — Progress Notes (Signed)
 [facility] [address] [place], [place] [address]   Office Visit Note  Patient Name: Holly Pineda Date of Birth [id number]  Medical Record number [id number]  Date of Service: 05/05/2024  Chief Complaint  Patient presents with   Acute Visit    Missed period-should have started yesterday. Would like to discuss testing for hormone levels.     HPI Pt is here for a visit. Missed her period, should have happened yesterday (day 29). Menses comes regularly around 29-31 days. Wants her FSH checked to see if she's premenopausal.  Having premenstrual symptoms, bloating, etc.  Sexually active. No vaginal discharge/bleeding/discomfort/itching/etc.  No other symptoms. Just wanted to be checked. Doesn't know when mom/grandma went into menopause.      ROS: Review of Systems  Genitourinary:  Positive for menstrual problem. Negative for dysuria, frequency, genital sores, hematuria, urgency, vaginal bleeding and vaginal discharge.     Current Medication:  Outpatient Encounter Medications as of 05/05/2024  Medication Sig   azelastine  (ASTELIN ) 0.1 % nasal spray Place 2 sprays into both nostrils 2 (two) times daily. Use in each nostril as directed (Patient taking differently: Place 2 sprays into both nostrils 2 (two) times daily as needed. Use in each nostril as directed)   ketoconazole  (NIZORAL ) 2 % cream Apply once to twice a day as needed to aa.   levothyroxine  (SYNTHROID ) 112 MCG tablet Take 1 tablet by mouth daily.   Magnesium Glycinate 120 MG CAPS Take by mouth.   Multiple Vitamin (MULTIVITAMIN) capsule Take 1 capsule by mouth daily.   OMEGA-3-ACID ETHYL ESTERS & D3 PO Take by mouth.   traZODone  (DESYREL ) 100 MG tablet TAKE 1 TABLET BY MOUTH AT BEDTIME AS NEEDED FOR SLEEP.   tretinoin  (RETIN-A ) 0.05 % cream Apply topically at bedtime.   [DISCONTINUED] fluconazole  (DIFLUCAN ) 150 MG tablet Take one tablet by mouth today.  May repeat in 5 days if symptoms persist    [DISCONTINUED] fluticasone (FLONASE) 50 MCG/ACT nasal spray    No facility-administered encounter medications on file as of 05/05/2024.      Medical History: Past Medical History:  Diagnosis Date   Allergy    Annual physical exam 06/06/2018   Anxiety 02/13/2019   Atypical mole 06/05/2015   right lower back/mild, right lateral abdomen/mild   Atypical mole 08/20/2015   right upper back paraspinal/excision   Atypical mole 08/27/2015   right superior buttock/excision   Atypical mole 02/24/2016   right inframammary/mild   Atypical mole 10/21/2017   left lower back/moderate   Basal cell carcinoma 09/04/2013   left sternum mid chest   Goiter 02/19/2022     Vital Signs: BP (!) 110/58   Pulse 100   Temp 98.4 F (36.9 C)   Ht 4' 11 (1.499 m)   Wt 48.1 kg   SpO2 99%   BMI 21.41 kg/m    Physical Exam Vitals and nursing note reviewed.  Constitutional:      General: She is not in acute distress.    Appearance: Normal appearance. She is well-developed. She is not toxic-appearing.     Comments: Afebrile, nontoxic, NAD  HENT:     Head: Normocephalic and atraumatic.     Mouth/Throat:     Mouth: Mucous membranes are moist.  Eyes:     General:        Right eye: No discharge.        Left eye: No discharge.     Conjunctiva/sclera: Conjunctivae normal.  Cardiovascular:  Rate and Rhythm: Normal rate.     Pulses: Normal pulses.  Pulmonary:     Effort: Pulmonary effort is normal. No respiratory distress.  Abdominal:     General: There is no distension.  Musculoskeletal:        General: Normal range of motion.     Cervical back: Normal range of motion and neck supple.  Skin:    General: Skin is warm and dry.     Findings: No rash.  Neurological:     Mental Status: She is alert and oriented to person, place, and time.     Sensory: Sensation is intact. No sensory deficit.     Motor: Motor function is intact.  Psychiatric:        Mood and Affect: Affect normal. Mood is  anxious.        Behavior: Behavior normal.       Assessment/Plan: 1. Menstrual period late (Primary) - POCT urine pregnancy   Pt here with missed period, though it's not truly late since menses usually comes every 29-31 days. UPreg neg here, but discussed that until she's passed the window of menses then upreg may not show positive anyway. If still delayed menses at day 35, would retest at that point. Likely will get menses soon though given premenstrual symptoms.  Could also be perimenopausal, but much too early to really tell clinically.  F/up with OBGYN to discuss whether labs would be helpful to determine perimenopause, but discussed it's truly more a clinical thing (skipped periods and eventually no longer menstruating)-- labs can be difficult to interpret in the premenopausal stages. No labs done today, would need to see OBGYN for this.  F/up as needed.    General Counseling: Holly Pineda understanding of the findings of todays visit and agrees with plan of treatment. I have discussed any further diagnostic evaluation that may be needed or ordered today. We also reviewed her medications today. she has been encouraged to call the office with any questions or concerns that should arise related to todays visit.   Orders Placed This Encounter  Procedures   POCT urine pregnancy   Results for orders placed or performed in visit on 05/05/24 (from the past 24 hours)  POCT urine pregnancy     Status: Normal   Collection Time: 05/05/24 11:03 AM  Result Value Ref Range   Preg Test, Ur Negative Negative     No orders of the defined types were placed in this encounter.   Time spent: [address], Development worker, international aid

## 2024-05-09 ENCOUNTER — Encounter: Payer: Self-pay | Admitting: Dermatology

## 2024-05-24 ENCOUNTER — Encounter: Payer: Self-pay | Admitting: Medical

## 2024-05-24 ENCOUNTER — Other Ambulatory Visit: Payer: Self-pay

## 2024-05-24 ENCOUNTER — Ambulatory Visit (INDEPENDENT_AMBULATORY_CARE_PROVIDER_SITE_OTHER): Payer: Self-pay | Admitting: Medical

## 2024-05-24 VITALS — BP 100/80 | HR 94 | Temp 98.1°F | Ht 59.0 in | Wt 105.0 lb

## 2024-05-24 DIAGNOSIS — R202 Paresthesia of skin: Secondary | ICD-10-CM

## 2024-05-24 NOTE — Patient Instructions (Addendum)
-  You may try applying a heating pad on a low-medium setting for 15-20 minutes to your left thigh where if teels tense. You can also try massaging that area with your hands or a foam roller. -Continue to monitor your symptoms. Send MyChart message to provider, call or schedule return visit as needed for new/worsening symptoms (i.e. pain, swelling, redness/discoloration, rash, increased numbness, fever or chills).

## 2024-05-24 NOTE — Progress Notes (Signed)
 [facility] [address] [place], [place] [address]   Office Visit Note  Patient Name: Holly Pineda Date of Birth [id number]  Medical Record number [id number]  Date of Service: 05/24/2024  Chief Complaint  Patient presents with   Numbness    Patient c/o lower L leg numbness. Began around 9 pm last night. She states her leg felt hot to the touch at the time but no longer does now.The numbness also seems to be improving over time.      HPI 43 y.o. female presents with left leg tingling/numbness.  Was watching a movie on the sofa last night when she developed tingling and decreased sensation to left lower leg (mainly anterior). Loss of sensation not complete, only decreased/different relative to right side. No associated pain, swelling or discoloration. Does not think she was sitting in any way that was uncomfortable/unusual. Went to sleep shortly after. Woke up around 4 AM, unsure if leg discomfort woke her up.States leg felt warm to touch at that time. Again, did not experience pain, swelling, rash or discoloration. No sx to left foot or other extremities. Denies muscle weakness, chest pain or shortness of breath.  Notes some tension to left posterolateral thigh, not pain. Overall, sx have seemed to improve with time.   No falls or other trauma. Has not had sx similar to this in past.   Current Medication:  Outpatient Encounter Medications as of 05/24/2024  Medication Sig   azelastine  (ASTELIN ) 0.1 % nasal spray Place 2 sprays into both nostrils 2 (two) times daily. Use in each nostril as directed (Patient taking differently: Place 2 sprays into both nostrils 2 (two) times daily as needed. Use in each nostril as directed)   ketoconazole  (NIZORAL ) 2 % cream Apply once to twice a day as needed to aa.   levothyroxine  (SYNTHROID ) 112 MCG tablet Take 1 tablet by mouth daily.   Magnesium Glycinate 120 MG CAPS Take by mouth.   Multiple Vitamin  (MULTIVITAMIN) capsule Take 1 capsule by mouth daily.   OMEGA-3-ACID ETHYL ESTERS & D3 PO Take by mouth.   traZODone  (DESYREL ) 100 MG tablet TAKE 1 TABLET BY MOUTH AT BEDTIME AS NEEDED FOR SLEEP.   tretinoin  (RETIN-A ) 0.05 % cream Apply topically at bedtime.   No facility-administered encounter medications on file as of 05/24/2024.      Medical History: Past Medical History:  Diagnosis Date   Allergy    Anxiety 02/13/2019   Atypical mole 06/05/2015   right lower back/mild, right lateral abdomen/mild   Atypical mole 08/20/2015   right upper back paraspinal/excision   Atypical mole 08/27/2015   right superior buttock/excision   Atypical mole 02/24/2016   right inframammary/mild   Atypical mole 10/21/2017   left lower back/moderate   Basal cell carcinoma 09/04/2013   left sternum mid chest   Goiter 02/19/2022   Hypothyroidism      Vital Signs: BP 100/80   Pulse 94   Temp 98.1 F (36.7 C)   Ht 4' 11 (1.499 m)   Wt 105 lb (47.6 kg)   SpO2 99%   BMI 21.21 kg/m    Review of Systems  Constitutional:  Negative for chills and fever.  Respiratory:  Negative for shortness of breath.   Cardiovascular:  Negative for chest pain and leg swelling.  Musculoskeletal:  Negative for arthralgias, back pain and joint swelling.  Skin:  Negative for color change and rash.  Neurological:  Positive for numbness (see HPI). Negative for  weakness and headaches.    Physical Exam Vitals reviewed.  Constitutional:      General: She is not in acute distress.    Appearance: She is not ill-appearing.  Cardiovascular:     Pulses:          Dorsalis pedis pulses are 2+ on the right side and 2+ on the left side.       Posterior tibial pulses are 2+ on the right side and 2+ on the left side.  Musculoskeletal:        General: No tenderness, deformity or signs of injury.     Lumbar back: No swelling, deformity, signs of trauma, spasms or tenderness.     Left upper leg: No swelling, edema or  tenderness.     Right lower leg: No swelling or tenderness. No edema.     Left lower leg: No swelling or tenderness. No edema.  Feet:     Right foot:     Skin integrity: Skin integrity normal.     Left foot:     Skin integrity: Skin integrity normal.  Skin:    General: Skin is warm and dry.     Capillary Refill: Capillary refill takes less than 2 seconds.     Comments: Bilateral LE without erythema, rash, wound, other lesions or discoloration. Skin is symmetrically warm and dry with normal texture. No prominent varicosities.  Neurological:     Mental Status: She is alert.     Gait: Gait is intact.     Comments: Sensation mildly decreased to left anteromedial lower leg relative to right leg. Sensation otherwise grossly intact/symmetric to bilateral LE.  Strength 5/5 and equal to bilateral LE.  Psychiatric:        Attention and Perception: Attention normal.        Mood and Affect: Affect normal.        Speech: Speech normal.        Behavior: Behavior is cooperative.    Assessment/Plan: 1. Paresthesia of left leg (Primary) Discussed reassuring exam findings. No evidence of impaired circulation, infection or injury. Clinical findings not typical of DVT. Sx perhaps secondary to peripheral nerve compression. Discussed option to try heating pad and gentle self-massage to tense area to left upper leg/thigh. Advised to monitor sx and return to clinic with new/worsening symptoms (i.e. pain, swelling, redness/discoloration, rash, increased numbness, fever or chills). Call or send message to provider with questions or concerns.  General Counseling: geralyn figiel understanding of the findings of todays visit. she has been encouraged to call the office with any questions or concerns that should arise related to todays visit.    Time spent:20 Minutes    Joen Arts PA-C Physician Assistant

## 2024-06-02 ENCOUNTER — Other Ambulatory Visit: Payer: Self-pay

## 2024-06-02 DIAGNOSIS — E039 Hypothyroidism, unspecified: Secondary | ICD-10-CM

## 2024-06-03 LAB — TSH: TSH: 2.47 u[IU]/mL (ref 0.450–4.500)

## 2024-07-19 ENCOUNTER — Encounter: Payer: Self-pay | Admitting: Nurse Practitioner

## 2024-07-25 ENCOUNTER — Other Ambulatory Visit: Payer: Self-pay | Admitting: Nurse Practitioner

## 2024-07-25 DIAGNOSIS — G4709 Other insomnia: Secondary | ICD-10-CM

## 2024-07-26 NOTE — Telephone Encounter (Signed)
 Requested Prescriptions  Pending Prescriptions Disp Refills   Azelastine  HCl 137 MCG/SPRAY SOLN [Pharmacy Med Name: AZELASTINE  0.1% (137 MCG) SPRY] 30 mL 2    Sig: PLACE 2 SPRAYS INTO BOTH NOSTRILS 2 (TWO) TIMES DAILY. USE IN EACH NOSTRIL AS DIRECTED     Ear, Nose, and Throat: Nasal Preparations - Antiallergy Failed - 07/26/2024  2:54 PM      Failed - Valid encounter within last 12 months    Recent Outpatient Visits   None

## 2024-07-28 ENCOUNTER — Encounter: Admitting: Nurse Practitioner

## 2024-08-09 ENCOUNTER — Encounter: Payer: Self-pay | Admitting: Nurse Practitioner

## 2024-08-09 ENCOUNTER — Ambulatory Visit (INDEPENDENT_AMBULATORY_CARE_PROVIDER_SITE_OTHER): Admitting: Nurse Practitioner

## 2024-08-09 VITALS — BP 102/68 | HR 99 | Temp 97.0°F | Ht 59.0 in | Wt 108.0 lb

## 2024-08-09 DIAGNOSIS — Z0000 Encounter for general adult medical examination without abnormal findings: Secondary | ICD-10-CM

## 2024-08-09 DIAGNOSIS — E039 Hypothyroidism, unspecified: Secondary | ICD-10-CM | POA: Diagnosis not present

## 2024-08-09 DIAGNOSIS — N959 Unspecified menopausal and perimenopausal disorder: Secondary | ICD-10-CM | POA: Diagnosis not present

## 2024-08-09 DIAGNOSIS — Z13220 Encounter for screening for lipoid disorders: Secondary | ICD-10-CM | POA: Diagnosis not present

## 2024-08-09 DIAGNOSIS — Z131 Encounter for screening for diabetes mellitus: Secondary | ICD-10-CM

## 2024-08-09 DIAGNOSIS — Z1231 Encounter for screening mammogram for malignant neoplasm of breast: Secondary | ICD-10-CM

## 2024-08-09 DIAGNOSIS — Z130 Encounter for screening for diseases of the blood and blood-forming organs and certain disorders involving the immune mechanism: Secondary | ICD-10-CM | POA: Diagnosis not present

## 2024-08-09 DIAGNOSIS — E559 Vitamin D deficiency, unspecified: Secondary | ICD-10-CM | POA: Diagnosis not present

## 2024-08-09 NOTE — Progress Notes (Signed)
 Name: Holly Pineda   MRN: [id number]    DOB: [birth year]   Date:08/09/2024       Progress Note  Subjective  Chief Complaint  Chief Complaint  Patient presents with   Annual Exam    HPI  Patient presents for annual CPE. Discussed the use of AI scribe software for clinical note transcription with the patient, who gave verbal consent to proceed.  History of Present Illness Holly Pineda is a 44 year old female who presents for an annual physical exam.  Preventive health maintenance - Up to date on mammogram, HIV, and hepatitis C screenings - Colonoscopy performed on October 14, 2023; up to date - Last dental exam last month - Last eye exam in October 2025  Thyroid  function - Diagnosed with hypothyroidism - Followed by endocrinology - Thyroid  levels last checked on June 02, 2024; within normal range - Currently taking levothyroxine  112 micrograms daily  Sleep patterns - Obtains six to seven hours of sleep per night - Not taking trazodone  for sleep - Prefers not to take pills for sleep  Diet and nutrition - Eats a well-balanced diet - Cooks Sunday dinners including fried chicken cutlets, potatoes, and corn with butter; eats leftovers the next day - Takes a multivitamin containing 300 mg, last taken yesterday  Physical activity - Walks daily  Sexual and social history - Sexually active with the same partner for nine months - No issues with incontinence - No violence at home    Diet: well balanced diet Exercise: works two jobs, and walks everyday  Sleep: 6-7 hours Last dental exam:last month Last eye exam: 05/2024  Flowsheet Row Office Visit from 08/09/2024 in [facility]  AUDIT-C Score 0   Depression: Phq 9 is  negative    08/09/2024    1:58 PM 07/14/2023    2:32 PM 12/30/2022    3:26 PM 12/30/2022    3:18 PM 07/09/2022    3:30 PM  Depression screen PHQ 2/9  Decreased Interest 0 0 0 0 0  Down, Depressed, Hopeless 0 0 0  0 0  PHQ - 2 Score 0 0 0 0 0  Altered sleeping  0 3  1  Tired, decreased energy  3 2  0  Change in appetite  0 0  0  Feeling bad or failure about yourself   0 0  0  Trouble concentrating  0 0  0  Moving slowly or fidgety/restless  0 0  0  Suicidal thoughts  0 0  0  PHQ-9 Score  3  5   1    Difficult doing work/chores  Not difficult at all Not difficult at all  Not difficult at all     Data saved with a previous flowsheet row definition   Hypertension: BP Readings from Last 3 Encounters:  08/09/24 102/68  05/24/24 100/80  05/05/24 (!) 110/58   Obesity: Wt Readings from Last 3 Encounters:  08/09/24 108 lb (49 kg)  05/24/24 105 lb (47.6 kg)  05/05/24 106 lb (48.1 kg)   BMI Readings from Last 3 Encounters:  08/09/24 21.81 kg/m  05/24/24 21.21 kg/m  05/05/24 21.41 kg/m     Vaccines:  HPV: up to at age 74 , ask insurance if age between 57-45  Shingrix: 62-64 yo and ask insurance if covered when patient above 76 yo Pneumonia:  educated and discussed with patient. Flu:  educated and discussed with patient.  Hep C Screening: completed STD testing and prevention (HIV/chl/gon/syphilis):  completed Intimate partner violence:none Sexual History :sexually active Menstrual History/LMP/Abnormal Bleeding: [facility]:08/03/2024 Incontinence Symptoms: none  Breast cancer:  - Last Mammogram: 10/21/2023 - BRCA gene screening: none  Osteoporosis: Discussed high calcium and vitamin D  supplementation, weight bearing exercises  Cervical cancer screening: 05/04/2023  Skin cancer: Discussed monitoring for atypical lesions  Colorectal cancer: 10/14/2023   Lung cancer:   Low Dose CT Chest recommended if Age 69-80 years, 20 pack-year currently smoking OR have quit w/in 15years. Patient does not qualify.   ECG: 06/06/2018  Advanced Care Planning: A voluntary discussion about advance care planning including the explanation and discussion of advance directives.  Discussed health care proxy and Living  will, and the patient was able to identify a health care proxy as Shona Kitty and Hobert Cliche.  Patient does not have a living will at present time. If patient does have living will, I have requested they bring this to the clinic to be scanned in to their chart.  Lipids: Lab Results  Component Value Date   CHOL 194 07/05/2023   CHOL 210 (H) 10/08/2022   CHOL 188 07/02/2022   Lab Results  Component Value Date   HDL 72 07/05/2023   HDL 74 10/08/2022   HDL 66 07/02/2022   Lab Results  Component Value Date   LDLCALC 110 (H) 07/05/2023   LDLCALC 128 (H) 10/08/2022   LDLCALC 107 (H) 07/02/2022   Lab Results  Component Value Date   TRIG 63 07/05/2023   TRIG 47 10/08/2022   TRIG 80 07/02/2022   Lab Results  Component Value Date   CHOLHDL 2.7 07/05/2023   CHOLHDL 2.8 07/02/2022   CHOLHDL 2.8 06/13/2021   No results found for: LDLDIRECT  Glucose: Glucose  Date Value Ref Range Status  07/05/2023 90 70 - 99 mg/dL Final  96/86/7975 88 70 - 99 mg/dL Final  87/93/7976 88 70 - 99 mg/dL Final    Patient Active Problem List   Diagnosis Date Noted   Hashimoto's thyroiditis 02/19/2023   Goiter 02/19/2022   Thyroid  nodule 02/19/2022   Thyroid  mass of unclear etiology 02/19/2022   Thyroid  nodule 02/19/2022   Hypothyroidism 01/30/2022   Hypothyroidism 01/30/2022   Non-seasonal allergic rhinitis due to pollen 07/05/2020   Insomnia 02/13/2019   Anxiety 02/13/2019   Smoker 06/06/2018   Elevated LDL cholesterol level 06/06/2018   Vitamin D  deficiency 06/06/2018   Annual physical exam 06/06/2018   Low vitamin D  level 11/16/2014   Low vitamin D  level 11/16/2014    Past Surgical History:  Procedure Laterality Date   AUGMENTATION MAMMAPLASTY Bilateral 2017   CESAREAN SECTION     CHOLECYSTECTOMY      Family History  Problem Relation Age of Onset   Cancer Mother    Hypertension Father    Dementia Maternal Grandmother    Liver cancer Paternal Grandmother    Breast  cancer Maternal Great-grandmother     Social History   Socioeconomic History   Marital status: Divorced    Spouse name: Not on file   Number of children: 2   Years of education: Not on file   Highest education level: Not on file  Occupational History   Not on file  Tobacco Use   Smoking status: Former    Current packs/day: 0.00    Average packs/day: 1 pack/day for 20.0 years (20.0 ttl pk-yrs)    Types: Cigarettes    Start date: 08/18/1998    Quit date: 08/18/2018    Years since quitting: 5.9  Smokeless tobacco: Never  Vaping Use   Vaping status: Never Used  Substance and Sexual Activity   Alcohol use: No   Drug use: No   Sexual activity: Yes    Birth control/protection: None, Condom  Other Topics Concern   Not on file  Social History Narrative   Not on file   Social Drivers of Health   Tobacco Use: Medium Risk (08/09/2024)   Patient History    Smoking Tobacco Use: Former    Smokeless Tobacco Use: Never    Passive Exposure: Not on file  Financial Resource Strain: Low Risk (08/09/2024)   Overall Financial Resource Strain (CARDIA)    Difficulty of Paying Living Expenses: Not hard at all  Food Insecurity: No Food Insecurity (08/09/2024)   Epic    Worried About Radiation Protection Practitioner of Food in the Last Year: Never true    Ran Out of Food in the Last Year: Never true  Transportation Needs: No Transportation Needs (08/09/2024)   Epic    Lack of Transportation (Medical): No    Lack of Transportation (Non-Medical): No  Physical Activity: Sufficiently Active (08/09/2024)   Exercise Vital Sign    Days of Exercise per Week: 6 days    Minutes of Exercise per Session: 30 min  Stress: No Stress Concern Present (08/09/2024)   [employer] of Occupational Health - Occupational Stress Questionnaire    Feeling of Stress: Not at all  Social Connections: Socially Isolated (08/09/2024)   Social Connection and Isolation Panel    Frequency of Communication with Friends and Family: Three times a week     Frequency of Social Gatherings with Friends and Family: Three times a week    Attends Religious Services: Never    Active Member of Clubs or Organizations: No    Attends Banker Meetings: Never    Marital Status: Divorced  Catering Manager Violence: Not At Risk (08/09/2024)   Epic    Fear of Current or Ex-Partner: No    Emotionally Abused: No    Physically Abused: No    Sexually Abused: No  Depression (PHQ2-9): Low Risk (08/09/2024)   Depression (PHQ2-9)    PHQ-2 Score: 0  Alcohol Screen: Low Risk (08/09/2024)   Alcohol Screen    Last Alcohol Screening Score (AUDIT): 0  Housing: Unknown (08/09/2024)   Epic    Unable to Pay for Housing in the Last Year: No    Number of Times Moved in the Last Year: Not on file    Homeless in the Last Year: No  Utilities: Not At Risk (08/09/2024)   Epic    Threatened with loss of utilities: No  Health Literacy: Adequate Health Literacy (08/09/2024)   B1300 Health Literacy    Frequency of need for help with medical instructions: Never    Current Medications[1]  Allergies[2]   ROS  Constitutional: Negative for fever or weight change.  Respiratory: Negative for cough and shortness of breath.   Cardiovascular: Negative for chest pain or palpitations.  Gastrointestinal: Negative for abdominal pain, no bowel changes.  Musculoskeletal: Negative for gait problem or joint swelling.  Skin: Negative for rash.  Neurological: Negative for dizziness or headache.  No other specific complaints in a complete review of systems (except as listed in HPI above).   Objective  Vitals:   08/09/24 1347  BP: 102/68  Pulse: 99  Temp: (!) 97 F (36.1 C)  SpO2: 98%  Weight: 108 lb (49 kg)  Height: 4' 11 (1.499 m)  Body mass index is 21.81 kg/m.  Physical Exam Vitals reviewed.  Constitutional:      Appearance: Normal appearance.  HENT:     Head: Normocephalic.     Right Ear: Tympanic membrane normal.     Left Ear: Tympanic membrane  normal.     Nose: Nose normal.  Eyes:     Extraocular Movements: Extraocular movements intact.     Conjunctiva/sclera: Conjunctivae normal.     Pupils: Pupils are equal, round, and reactive to light.  Neck:     Thyroid : No thyroid  mass, thyromegaly or thyroid  tenderness.  Cardiovascular:     Rate and Rhythm: Normal rate and regular rhythm.     Pulses: Normal pulses.     Heart sounds: Normal heart sounds.  Pulmonary:     Effort: Pulmonary effort is normal.     Breath sounds: Normal breath sounds.  Abdominal:     General: Bowel sounds are normal.     Palpations: Abdomen is soft.  Musculoskeletal:        General: Normal range of motion.     Cervical back: Normal range of motion and neck supple.     Right lower leg: No edema.     Left lower leg: No edema.  Skin:    General: Skin is warm and dry.     Capillary Refill: Capillary refill takes less than 2 seconds.  Neurological:     General: No focal deficit present.     Mental Status: She is alert and oriented to person, place, and time. Mental status is at baseline.  Psychiatric:        Mood and Affect: Mood normal.        Behavior: Behavior normal.        Thought Content: Thought content normal.        Judgment: Judgment normal.      Recent Results (from the past 2160 hours)  TSH     Status: None   Collection Time: 06/02/24  7:50 AM  Result Value Ref Range   TSH 2.470 0.450 - 4.500 uIU/mL    Fall Risk:    12/30/2022    3:18 PM 07/09/2022    3:30 PM  Fall Risk   Falls in the past year? 0 0  Number falls in past yr: 0 0  Injury with Fall? 0  0   Follow up  Falls evaluation completed      Data saved with a previous flowsheet row definition     Functional Status Survey: Is the patient deaf or have difficulty hearing?: No Does the patient have difficulty seeing, even when wearing glasses/contacts?: No Does the patient have difficulty concentrating, remembering, or making decisions?: No Does the patient have  difficulty walking or climbing stairs?: No Does the patient have difficulty dressing or bathing?: No Does the patient have difficulty doing errands alone such as visiting a doctor's office or shopping?: No   Assessment & Plan  Problem List Items Addressed This Visit       Other   Annual physical exam - Primary   Relevant Orders   MM 3D SCREENING MAMMOGRAM BILATERAL BREAST   Other Visit Diagnoses       Encounter for screening mammogram for malignant neoplasm of breast       Relevant Orders   MM 3D SCREENING MAMMOGRAM BILATERAL BREAST      Assessment and Plan Assessment & Plan Hypothyroidism Well-managed with levothyroxine  112 mcg daily. Recent thyroid  function test on October 31st, 2025,  was within normal range. She is interested in rechecking TSH levels despite stability. - Continue levothyroxine  112 mcg daily - Ordered TSH test  Vitamin D  deficiency Managed with multivitamin containing 300 mg of vitamin D . She has not taken the multivitamin for a few days and is interested in rechecking vitamin D  levels. - Ordered vitamin D  level test  General Health Maintenance She is up to date on mammogram, HIV, and hepatitis C screenings. Colonoscopy was performed on March 13th, 2025. She maintains a well-balanced diet and engages in regular physical activity. Blood pressure and weight are within normal limits. She is sexually active with a stable partner for nine months and has no issues with incontinence or violence at home. She is interested in comprehensive blood work including cholesterol, B12, folate, and other routine tests. - Ordered comprehensive blood work including cholesterol, B12, folate, and other routine tests - Ordered hormone panel including /FSHLH, estradiol , and progesterone  - Provided script for blood work at occupational health - Continue routine health maintenance screenings    -USPSTF grade A and B recommendations reviewed with patient; age-appropriate  recommendations, preventive care, screening tests, etc discussed and encouraged; healthy living encouraged; see AVS for patient education given to patient -Discussed importance of 150 minutes of physical activity weekly, eat two servings of fish weekly, eat one serving of tree nuts ( cashews, pistachios, pecans, almonds.SABRA) every other day, eat 6 servings of fruit/vegetables daily and drink plenty of water and avoid sweet beverages.   -Reviewed Health Maintenance: yes     [1]  Current Outpatient Medications:    Azelastine  HCl 137 MCG/SPRAY SOLN, PLACE 2 SPRAYS INTO BOTH NOSTRILS 2 (TWO) TIMES DAILY. USE IN EACH NOSTRIL AS DIRECTED, Disp: 30 mL, Rfl: 2   ketoconazole  (NIZORAL ) 2 % cream, Apply once to twice a day as needed to aa., Disp: 30 g, Rfl: 3   levothyroxine  (SYNTHROID ) 112 MCG tablet, Take 1 tablet by mouth daily., Disp: , Rfl:    Magnesium Glycinate 120 MG CAPS, Take by mouth., Disp: , Rfl:    Multiple Vitamin (MULTIVITAMIN) capsule, Take 1 capsule by mouth daily., Disp: , Rfl:    OMEGA-3-ACID ETHYL ESTERS & D3 PO, Take by mouth., Disp: , Rfl:    traZODone  (DESYREL ) 100 MG tablet, TAKE 1 TABLET BY MOUTH AT BEDTIME AS NEEDED FOR SLEEP., Disp: 90 tablet, Rfl: 1   tretinoin  (RETIN-A ) 0.05 % cream, Apply topically at bedtime., Disp: , Rfl:  [2]  Allergies Allergen Reactions   Chlorhexidine Itching and Rash

## 2024-08-10 ENCOUNTER — Encounter: Payer: Self-pay | Admitting: Nurse Practitioner

## 2024-08-11 ENCOUNTER — Other Ambulatory Visit: Payer: Self-pay

## 2024-08-11 ENCOUNTER — Ambulatory Visit: Payer: Self-pay | Admitting: Adult Health

## 2024-08-11 ENCOUNTER — Encounter: Payer: Self-pay | Admitting: Adult Health

## 2024-08-11 VITALS — BP 112/80 | HR 96 | Temp 97.9°F | Ht 59.0 in | Wt 108.0 lb

## 2024-08-11 DIAGNOSIS — N760 Acute vaginitis: Secondary | ICD-10-CM

## 2024-08-11 DIAGNOSIS — E039 Hypothyroidism, unspecified: Secondary | ICD-10-CM

## 2024-08-11 DIAGNOSIS — Z13220 Encounter for screening for lipoid disorders: Secondary | ICD-10-CM

## 2024-08-11 DIAGNOSIS — E559 Vitamin D deficiency, unspecified: Secondary | ICD-10-CM

## 2024-08-11 DIAGNOSIS — N959 Unspecified menopausal and perimenopausal disorder: Secondary | ICD-10-CM

## 2024-08-11 DIAGNOSIS — Z131 Encounter for screening for diabetes mellitus: Secondary | ICD-10-CM

## 2024-08-11 DIAGNOSIS — Z0000 Encounter for general adult medical examination without abnormal findings: Secondary | ICD-10-CM

## 2024-08-11 DIAGNOSIS — B9689 Other specified bacterial agents as the cause of diseases classified elsewhere: Secondary | ICD-10-CM

## 2024-08-11 MED ORDER — METRONIDAZOLE 500 MG PO TABS: 500.0000 mg | ORAL_TABLET | Freq: Two times a day (BID) | ORAL | 0 refills | Status: AC

## 2024-08-11 NOTE — Progress Notes (Unsigned)
 Therapist, [facility] [address] [place], [place] [address]   Office Visit Note  Patient Name: Holly Pineda Date of Birth [id number]  Medical Record number [id number]  Date of Service: 08/11/2024  Chief Complaint  Patient presents with   Acute Visit    Patient states she has a fishy vaginal odor with a thin, watery discharge x 3- days. She reports a hx of bacterial vaginitis with similar symptoms. Denies itching, burning, and pain. She did not take her probiotic for the last 2 weeks and feels that may also be the partial cause.     HPI Pt is here for a sick visit. She reports for 3-4 days she has been having BV symptoms.  She has a history of the same.  She reports odor and discharge.    Current Medication:  Outpatient Encounter Medications as of 08/11/2024  Medication Sig   Azelastine  HCl 137 MCG/SPRAY SOLN PLACE 2 SPRAYS INTO BOTH NOSTRILS 2 (TWO) TIMES DAILY. USE IN EACH NOSTRIL AS DIRECTED   ketoconazole  (NIZORAL ) 2 % cream Apply once to twice a day as needed to aa.   levothyroxine  (SYNTHROID ) 112 MCG tablet Take 1 tablet by mouth daily.   Magnesium Glycinate 120 MG CAPS Take by mouth.   Multiple Vitamin (MULTIVITAMIN) capsule Take 1 capsule by mouth daily.   OMEGA-3-ACID ETHYL ESTERS & D3 PO Take by mouth.   traZODone  (DESYREL ) 100 MG tablet TAKE 1 TABLET BY MOUTH AT BEDTIME AS NEEDED FOR SLEEP.   tretinoin  (RETIN-A ) 0.05 % cream Apply topically at bedtime.   No facility-administered encounter medications on file as of 08/11/2024.      Medical History: Past Medical History:  Diagnosis Date   Allergy    Anxiety 02/13/2019   Atypical mole 06/05/2015   right lower back/mild, right lateral abdomen/mild   Atypical mole 08/20/2015   right upper back paraspinal/excision   Atypical mole 08/27/2015   right superior buttock/excision   Atypical mole 02/24/2016   right inframammary/mild   Atypical mole 10/21/2017   left lower back/moderate   Basal cell carcinoma  09/04/2013   left sternum mid chest   Goiter 02/19/2022   Hypothyroidism      Vital Signs: BP 112/80   Pulse 96   Temp 97.9 F (36.6 C)   Ht 4' 11 (1.499 m)   Wt 108 lb (49 kg)   SpO2 100%   BMI 21.81 kg/m    Review of Systems  Constitutional:  Negative for chills, fatigue and fever.  Genitourinary:  Positive for vaginal discharge. Negative for difficulty urinating, dysuria and genital sores.    Physical Exam Vitals reviewed.     Assessment/Plan:   General Counseling: [name] understanding of the findings of todays visit and agrees with plan of treatment. I have discussed any further diagnostic evaluation that may be needed or ordered today. We also reviewed her medications today. she has been encouraged to call the office with any questions or concerns that should arise related to todays visit.   No orders of the defined types were placed in this encounter.   No orders of the defined types were placed in this encounter.   Time spent:*** Minutes    [name] AGNP-C Nurse Practitioner

## 2024-08-12 LAB — CBC WITH DIFFERENTIAL/PLATELET
Basophils Absolute: 0.1 x10E3/uL (ref 0.0–0.2)
Basos: 1 %
EOS (ABSOLUTE): 0.2 x10E3/uL (ref 0.0–0.4)
Eos: 2 %
Hematocrit: 35.9 % (ref 34.0–46.6)
Hemoglobin: 11.6 g/dL (ref 11.1–15.9)
Immature Grans (Abs): 0 x10E3/uL (ref 0.0–0.1)
Immature Granulocytes: 0 %
Lymphocytes Absolute: 1.5 x10E3/uL (ref 0.7–3.1)
Lymphs: 21 %
MCH: 29.1 pg (ref 26.6–33.0)
MCHC: 32.3 g/dL (ref 31.5–35.7)
MCV: 90 fL (ref 79–97)
Monocytes Absolute: 0.7 x10E3/uL (ref 0.1–0.9)
Monocytes: 11 %
Neutrophils Absolute: 4.4 x10E3/uL (ref 1.4–7.0)
Neutrophils: 65 %
Platelets: 256 x10E3/uL (ref 150–450)
RBC: 3.99 x10E6/uL (ref 3.77–5.28)
RDW: 13.1 % (ref 11.7–15.4)
WBC: 6.9 x10E3/uL (ref 3.4–10.8)

## 2024-08-12 LAB — IRON,TIBC AND FERRITIN PANEL
Ferritin: 12 ng/mL — ABNORMAL LOW (ref 15–150)
Iron Saturation: 8 % — CL (ref 15–55)
Iron: 35 ug/dL (ref 27–159)
Total Iron Binding Capacity: 435 ug/dL (ref 250–450)
UIBC: 400 ug/dL (ref 131–425)

## 2024-08-12 LAB — COMPREHENSIVE METABOLIC PANEL WITH GFR
ALT: 12 IU/L (ref 0–32)
AST: 17 IU/L (ref 0–40)
Albumin: 4.5 g/dL (ref 3.9–4.9)
Alkaline Phosphatase: 71 IU/L (ref 41–116)
BUN/Creatinine Ratio: 23 (ref 9–23)
BUN: 16 mg/dL (ref 6–24)
Bilirubin Total: 0.3 mg/dL (ref 0.0–1.2)
CO2: 23 mmol/L (ref 20–29)
Calcium: 9.6 mg/dL (ref 8.7–10.2)
Chloride: 100 mmol/L (ref 96–106)
Creatinine, Ser: 0.71 mg/dL (ref 0.57–1.00)
Globulin, Total: 2.4 g/dL (ref 1.5–4.5)
Glucose: 87 mg/dL (ref 70–99)
Potassium: 4.4 mmol/L (ref 3.5–5.2)
Sodium: 138 mmol/L (ref 134–144)
Total Protein: 6.9 g/dL (ref 6.0–8.5)
eGFR: 107 mL/min/1.73

## 2024-08-12 LAB — LIPID PANEL
Chol/HDL Ratio: 2.5 ratio (ref 0.0–4.4)
Cholesterol, Total: 201 mg/dL — ABNORMAL HIGH (ref 100–199)
HDL: 80 mg/dL
LDL Chol Calc (NIH): 110 mg/dL — ABNORMAL HIGH (ref 0–99)
Triglycerides: 59 mg/dL (ref 0–149)
VLDL Cholesterol Cal: 11 mg/dL (ref 5–40)

## 2024-08-12 LAB — FSH/LH
FSH: 5.8 m[IU]/mL
LH: 9.9 m[IU]/mL

## 2024-08-12 LAB — HEMOGLOBIN A1C
Est. average glucose Bld gHb Est-mCnc: 108 mg/dL
Hgb A1c MFr Bld: 5.4 % (ref 4.8–5.6)

## 2024-08-12 LAB — VITAMIN D 25 HYDROXY (VIT D DEFICIENCY, FRACTURES): Vit D, 25-Hydroxy: 51.5 ng/mL (ref 30.0–100.0)

## 2024-08-12 LAB — B12 AND FOLATE PANEL
Folate: >20 ng/mL
Vitamin B-12: 621 pg/mL (ref 232–1245)

## 2024-08-12 LAB — PROGESTERONE: Progesterone: 0.3 ng/mL

## 2024-08-12 LAB — TSH: TSH: 2.16 u[IU]/mL (ref 0.450–4.500)

## 2024-08-12 LAB — ESTRADIOL: Estradiol: 175 pg/mL

## 2024-08-14 ENCOUNTER — Encounter: Payer: Self-pay | Admitting: Adult Health

## 2024-08-14 ENCOUNTER — Ambulatory Visit: Payer: Self-pay | Admitting: Nurse Practitioner

## 2024-08-14 DIAGNOSIS — E611 Iron deficiency: Secondary | ICD-10-CM

## 2024-08-14 MED ORDER — IRON (FERROUS SULFATE) 325 (65 FE) MG PO TABS: 325.0000 mg | ORAL_TABLET | Freq: Every day | ORAL | 1 refills | Status: AC

## 2024-08-16 ENCOUNTER — Other Ambulatory Visit: Payer: Self-pay | Admitting: Nurse Practitioner

## 2024-08-16 DIAGNOSIS — E611 Iron deficiency: Secondary | ICD-10-CM

## 2024-08-18 ENCOUNTER — Telehealth: Payer: Self-pay | Admitting: Oncology

## 2024-08-18 NOTE — Telephone Encounter (Signed)
 Returned call from vm - pt said she might want to change days for new hem - pt said she will call us  back if she changes her mind - LH

## 2024-08-21 ENCOUNTER — Inpatient Hospital Stay

## 2024-08-21 ENCOUNTER — Inpatient Hospital Stay: Attending: Oncology | Admitting: Oncology

## 2024-08-21 ENCOUNTER — Encounter: Payer: Self-pay | Admitting: Oncology

## 2024-08-21 ENCOUNTER — Telehealth: Payer: Self-pay

## 2024-08-21 VITALS — BP 116/75 | HR 101 | Temp 98.8°F | Resp 19 | Ht 59.0 in | Wt 108.6 lb

## 2024-08-21 DIAGNOSIS — D509 Iron deficiency anemia, unspecified: Secondary | ICD-10-CM | POA: Diagnosis not present

## 2024-08-21 NOTE — Progress Notes (Signed)
 New patient; Iron  deficiency - Referred by Mliss Spray.

## 2024-08-21 NOTE — Telephone Encounter (Signed)
 Per Dr. Darold wrap up Refer to Dr. Aundria [facility]. Patient last seen at [facility] by provider [facility] NP on 03/13/24; colonoscopy done by Dr. Aundria 10/08/23. Outbound call to [facility], spoke to Stevens and informed of above.  Patient is supposed to have a f/u scheduled in August; Dr. Melanee would like patient seen sooner than planned preferably February / March.  Conita requested if the most recent OV notes can be faxed over for review; informed will more likely be tomorrow; asked to put attn: Enes.

## 2024-08-21 NOTE — Progress Notes (Signed)
 "  Hematology/Oncology Consult note [facility] [phone] [phone]  Patient Care Team: Gareth Mliss FALCON, FNP as PCP - General (Nurse Practitioner) Hosie Arnulfo Browning, MD as Referring Physician (Endocrinology) Melanee Annah BROCKS, MD as Consulting Physician (Oncology)   Name of the patient: Holly Pineda  [id number]  [birth year]    Reason for referral-iron  deficiency anemia   Referring physician-Julie Gareth, FNP  Date of visit: 08/21/24   History of presenting illness- Patient is a 44 year old female with a past medical history significant for Hashimoto's thyroiditis, hypothyroidism referred for iron  deficiency anemia.  CBC from 08/11/2024 showed white cell count of 6.9, H&H of 11.6/35.9 with a platelet count of 256 and an MCV of 90.  Iron  studies showed iron  saturation of 8 and ferritin of 12.  B12 levels normal at 621 and folate greater than 20.  TSH normal at 2.1.  Looking back at her prior CBCs her hemoglobin 2 to 3 years ago was around 13  Patient had a colonoscopy in March 2025 by Dr. Aundria which showed nonbleeding internal hemorrhoids.  No specimens were collected.  She has not had an EGD yet.  Discussed the use of AI scribe software for clinical note transcription with the patient, who gave verbal consent to proceed.  She reports significant fatigue and exertional dyspnea. Despite adherence to a diet rich in iron -containing foods, including quinoa, red meat, and spinach, symptoms persist.  She denies overt gastrointestinal bleeding, including hematochezia and melena. Colonoscopy in March 2024 was unremarkable. She does not use NSAIDs. Bowel habits are regular. She has not undergone upper endoscopy or capsule endoscopy to further evaluate for gastrointestinal blood loss.  Menstrual history is notable for menorrhagia since the onset of thyroid  dysfunction in 2021. After improvement in thyroid  function, menstrual flow became steadier but  remains heavy for one day per cycle, requiring three to five pads, with cycles lasting approximately four days. She underwent partial thyroidectomy in December 2024 and continues on thyroid  medication with stable thyroid  function tests. She notes some improvement in menstrual cycles compared to prior years.  She has not tolerated oral iron  supplementation due to gastrointestinal side effects and has not taken the prescribed iron  tablet.       ECOG PS- 0  Pain scale- 0   Review of systems- Review of Systems  Constitutional:  Positive for malaise/fatigue. Negative for chills, fever and weight loss.  HENT:  Negative for congestion, ear discharge and nosebleeds.   Eyes:  Negative for blurred vision.  Respiratory:  Negative for cough, hemoptysis, sputum production, shortness of breath and wheezing.   Cardiovascular:  Negative for chest pain, palpitations, orthopnea and claudication.  Gastrointestinal:  Negative for abdominal pain, blood in stool, constipation, diarrhea, heartburn, melena, nausea and vomiting.  Genitourinary:  Negative for dysuria, flank pain, frequency, hematuria and urgency.  Musculoskeletal:  Negative for back pain, joint pain and myalgias.  Skin:  Negative for rash.  Neurological:  Negative for dizziness, tingling, focal weakness, seizures, weakness and headaches.  Endo/Heme/Allergies:  Does not bruise/bleed easily.  Psychiatric/Behavioral:  Negative for depression and suicidal ideas. The patient does not have insomnia.     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   Thyroid  nodule 02/19/2022   Hypothyroidism 01/30/2022   Hypothyroidism 01/30/2022   Non-seasonal allergic rhinitis due to pollen 07/05/2020   Insomnia 02/13/2019  Anxiety 02/13/2019   Smoker 06/06/2018   Elevated LDL cholesterol level 06/06/2018    Vitamin D  deficiency 06/06/2018   Annual physical exam 06/06/2018   Low vitamin D  level 11/16/2014   Low vitamin D  level 11/16/2014     Past Medical History:  Diagnosis Date   Allergy    Anxiety 02/13/2019   Atypical mole 06/05/2015   right lower back/mild, right lateral abdomen/mild   Atypical mole 08/20/2015   right upper back paraspinal/excision   Atypical mole 08/27/2015   right superior buttock/excision   Atypical mole 02/24/2016   right inframammary/mild   Atypical mole 10/21/2017   left lower back/moderate   Basal cell carcinoma 09/04/2013   left sternum mid chest   Goiter 02/19/2022   Hypothyroidism      Past Surgical History:  Procedure Laterality Date   AUGMENTATION MAMMAPLASTY Bilateral 2017   CESAREAN SECTION     CHOLECYSTECTOMY      Social History   Socioeconomic History   Marital status: Divorced    Spouse name: Not on file   Number of children: 2   Years of education: Not on file   Highest education level: Not on file  Occupational History   Not on file  Tobacco Use   Smoking status: Former    Current packs/day: 0.00    Average packs/day: 1 pack/day for 20.0 years (20.0 ttl pk-yrs)    Types: Cigarettes    Start date: 08/18/1998    Quit date: 08/18/2018    Years since quitting: 6.0   Smokeless tobacco: Never  Vaping Use   Vaping status: Never Used  Substance and Sexual Activity   Alcohol use: No   Drug use: No   Sexual activity: Yes    Birth control/protection: None, Condom  Other Topics Concern   Not on file  Social History Narrative   Not on file   Social Drivers of Health   Tobacco Use: Medium Risk (08/11/2024)   Patient History    Smoking Tobacco Use: Former    Smokeless Tobacco Use: Never    Passive Exposure: Not on Actuary Strain: Low Risk (08/09/2024)   Overall Financial Resource Strain (CARDIA)    Difficulty of Paying Living Expenses: Not hard at all  Food Insecurity: No Food Insecurity (08/09/2024)   Epic     Worried About Programme Researcher, Broadcasting/film/video in the Last Year: Never true    [employer] of Food in the Last Year: Never true  Transportation Needs: No Transportation Needs (08/09/2024)   Epic    Lack of Transportation (Medical): No    Lack of Transportation (Non-Medical): No  Physical Activity: Sufficiently Active (08/09/2024)   Exercise Vital Sign    Days of Exercise per Week: 6 days    Minutes of Exercise per Session: 30 min  Stress: No Stress Concern Present (08/09/2024)   [employer] of Occupational Health - Occupational Stress Questionnaire    Feeling of Stress: Not at all  Social Connections: Socially Isolated (08/09/2024)   Social Connection and Isolation Panel    Frequency of Communication with Friends and Family: Three times a week    Frequency of Social Gatherings with Friends and Family: Three times a week    Attends Religious Services: Never    Active Member of Clubs or Organizations: No    Attends Banker Meetings: Never    Marital Status: Divorced  Intimate Partner Violence: Not At Risk (08/09/2024)   Epic    Fear of Current  or Ex-Partner: No    Emotionally Abused: No    Physically Abused: No    Sexually Abused: No  Depression (PHQ2-9): Low Risk (08/09/2024)   Depression (PHQ2-9)    PHQ-2 Score: 0  Alcohol Screen: Low Risk (08/09/2024)   Alcohol Screen    Last Alcohol Screening Score (AUDIT): 0  Housing: Unknown (08/09/2024)   Epic    Unable to Pay for Housing in the Last Year: No    Number of Times Moved in the Last Year: Not on file    Homeless in the Last Year: No  Utilities: Not At Risk (08/09/2024)   Epic    Threatened with loss of utilities: No  Health Literacy: Adequate Health Literacy (08/09/2024)   B1300 Health Literacy    Frequency of need for help with medical instructions: Never     Family History  Problem Relation Age of Onset   Cancer Mother    Hypertension Father    Dementia Maternal Grandmother    Liver cancer Paternal Grandmother    Breast  cancer Maternal Great-grandmother     Current Medications[2]   Physical exam:  Vitals:   08/21/24 1451  BP: 116/75  Pulse: (!) 101  Resp: 19  Temp: 98.8 F (37.1 C)  TempSrc: Tympanic  SpO2: 100%  Weight: 108 lb 9.6 oz (49.3 kg)  Height: 4' 11 (1.499 m)   Physical Exam Cardiovascular:     Rate and Rhythm: Normal rate and regular rhythm.     Heart sounds: Normal heart sounds.  Pulmonary:     Effort: Pulmonary effort is normal.     Breath sounds: Normal breath sounds.  Abdominal:     General: Bowel sounds are normal. There is no distension.     Palpations: Abdomen is soft.     Tenderness: There is no abdominal tenderness.  Skin:    General: Skin is warm and dry.  Neurological:     Mental Status: She is alert and oriented to person, place, and time.           Latest Ref Rng & Units 08/11/2024    7:22 AM  CMP  Glucose 70 - 99 mg/dL 87   BUN 6 - 24 mg/dL 16   Creatinine 9.42 - 1.00 mg/dL 9.28   Sodium 865 - 855 mmol/L 138   Potassium 3.5 - 5.2 mmol/L 4.4   Chloride 96 - 106 mmol/L 100   CO2 20 - 29 mmol/L 23   Calcium 8.7 - 10.2 mg/dL 9.6   Total Protein 6.0 - 8.5 g/dL 6.9   Total Bilirubin 0.0 - 1.2 mg/dL 0.3   Alkaline Phos 41 - 116 IU/L 71   AST 0 - 40 IU/L 17   ALT 0 - 32 IU/L 12       Latest Ref Rng & Units 08/11/2024    7:22 AM  CBC  WBC 3.4 - 10.8 x10E3/uL 6.9   Hemoglobin 11.1 - 15.9 g/dL 88.3   Hematocrit 65.9 - 46.6 % 35.9   Platelets 150 - 450 x10E3/uL 256      Assessment and plan- Patient is a 44 y.o. female referred for iron  deficiency anemia  Assessment and Plan    Iron  deficiency anemia Chronic iron  deficiency anemia with low ferritin and mild anemia, likely multifactorial due to menorrhagia post-thyroid  dysfunction and partial thyroidectomy. GI blood loss not fully excluded; colonoscopy normal. Symptoms include fatigue and dyspnea.  - Based on her insurance we will plan to give her Venofer  200 mg IV  x 5 doses- Discussed infusion  details: 30-minute sessions, well tolerated, rare allergic risk, monitored during infusion. - Deferred labs; repeat hemoglobin and iron  indices in 2 months to assess response. - Discussed oral iron  or multivitamin with iron  for maintenance if tolerated. - Referred to gastroenterology for further GI blood loss evaluation. - Advised iron  studies every 3 months post-repletion. - I am referring her back to Dr. Aundria to complete her GI workup since she has not had a colonoscopy but did not get EGD or capsule endoscopy.  History is not suggestive of menorrhagia contributing to her iron  deficiency     Thank you for this kind referral and the opportunity to participate in the care of this patient   Visit Diagnosis 1. Iron  deficiency anemia, unspecified iron  deficiency anemia type     Dr. Annah Skene, MD, MPH [facility] [phone] 08/21/2024                   [1]  Allergies Allergen Reactions   Chlorhexidine Itching and Rash  [2]  Current Outpatient Medications:    Azelastine  HCl 137 MCG/SPRAY SOLN, PLACE 2 SPRAYS INTO BOTH NOSTRILS 2 (TWO) TIMES DAILY. USE IN EACH NOSTRIL AS DIRECTED, Disp: 30 mL, Rfl: 2   levothyroxine  (SYNTHROID ) 112 MCG tablet, Take 1 tablet by mouth daily., Disp: , Rfl:    Magnesium Glycinate 120 MG CAPS, Take by mouth., Disp: , Rfl:    Multiple Vitamin (MULTIVITAMIN) capsule, Take 1 capsule by mouth daily., Disp: , Rfl:    OMEGA-3-ACID ETHYL ESTERS & D3 PO, Take by mouth., Disp: , Rfl:    tretinoin  (RETIN-A ) 0.05 % cream, Apply topically at bedtime., Disp: , Rfl:    Iron , Ferrous Sulfate , 325 (65 Fe) MG TABS, Take 325 mg by mouth daily., Disp: 90 tablet, Rfl: 1   ketoconazole  (NIZORAL ) 2 % cream, Apply once to twice a day as needed to aa. (Patient not taking: Reported on 08/21/2024), Disp: 30 g, Rfl: 3   traZODone  (DESYREL ) 100 MG tablet, TAKE 1 TABLET BY MOUTH AT BEDTIME AS NEEDED FOR SLEEP. (Patient not taking: Reported on  08/21/2024), Disp: 90 tablet, Rfl: 1  "

## 2024-08-22 ENCOUNTER — Encounter: Payer: Self-pay | Admitting: Oncology

## 2024-08-22 ENCOUNTER — Telehealth: Payer: Self-pay | Admitting: Oncology

## 2024-08-22 NOTE — Telephone Encounter (Signed)
"  Fax sent confirmation received  "

## 2024-08-22 NOTE — Telephone Encounter (Signed)
 Pt called to give clinic info for blood work - passed radio producer to [employer] nurse - [facility]

## 2024-08-23 ENCOUNTER — Inpatient Hospital Stay

## 2024-08-23 ENCOUNTER — Telehealth: Payer: Self-pay | Admitting: Oncology

## 2024-08-23 VITALS — BP 106/62 | HR 94 | Temp 98.2°F

## 2024-08-23 DIAGNOSIS — D509 Iron deficiency anemia, unspecified: Secondary | ICD-10-CM

## 2024-08-23 MED ORDER — IRON SUCROSE 20 MG/ML IV SOLN
200.0000 mg | INTRAVENOUS | Status: DC
  Administered 2024-08-23: 200 mg via INTRAVENOUS
  Filled 2024-08-23: qty 10

## 2024-08-23 MED ORDER — SODIUM CHLORIDE 0.9% FLUSH
10.0000 mL | Freq: Once | INTRAVENOUS | Status: AC | PRN
  Administered 2024-08-23: 10 mL
  Filled 2024-08-23: qty 10

## 2024-08-23 NOTE — Telephone Encounter (Signed)
 Pt calling and wants to know what the cost is for her iropn infusuions. She statesshe claled her insurance comoay and

## 2024-08-23 NOTE — Progress Notes (Signed)
 Patient tolerated Venofer  well, no questions/concerns voiced. Monitored 30 min post transfusion. Patient stable at discharge. VSS. AVS given.

## 2024-08-23 NOTE — Patient Instructions (Signed)

## 2024-08-23 NOTE — Telephone Encounter (Signed)
 ERROR

## 2024-08-24 ENCOUNTER — Other Ambulatory Visit: Payer: Self-pay

## 2024-08-24 ENCOUNTER — Ambulatory Visit: Admitting: Dermatology

## 2024-08-24 DIAGNOSIS — D509 Iron deficiency anemia, unspecified: Secondary | ICD-10-CM

## 2024-08-25 ENCOUNTER — Inpatient Hospital Stay

## 2024-08-25 VITALS — BP 105/65 | HR 91 | Temp 97.2°F | Resp 16

## 2024-08-25 DIAGNOSIS — D509 Iron deficiency anemia, unspecified: Secondary | ICD-10-CM

## 2024-08-25 MED ORDER — IRON SUCROSE 20 MG/ML IV SOLN
200.0000 mg | INTRAVENOUS | Status: DC
  Administered 2024-08-25: 200 mg via INTRAVENOUS
  Filled 2024-08-25: qty 10

## 2024-08-25 NOTE — Patient Instructions (Signed)

## 2024-08-26 LAB — CELIAC DISEASE PANEL
Endomysial IgA: NEGATIVE
Immunoglobulin A, (IgA) QN, Serum: 157 mg/dL (ref 87–352)
t-Transglutaminase (tTG) IgA: <2 U/mL (ref 0–3)

## 2024-08-28 ENCOUNTER — Inpatient Hospital Stay

## 2024-08-30 ENCOUNTER — Inpatient Hospital Stay

## 2024-08-30 VITALS — BP 121/67 | HR 93 | Temp 98.1°F

## 2024-08-30 DIAGNOSIS — D509 Iron deficiency anemia, unspecified: Secondary | ICD-10-CM

## 2024-08-30 MED ORDER — IRON SUCROSE 20 MG/ML IV SOLN
200.0000 mg | INTRAVENOUS | Status: DC
  Administered 2024-08-30: 200 mg via INTRAVENOUS
  Filled 2024-08-30: qty 10

## 2024-08-30 MED ORDER — SODIUM CHLORIDE 0.9% FLUSH
10.0000 mL | Freq: Once | INTRAVENOUS | Status: AC | PRN
  Administered 2024-08-30: 10 mL
  Filled 2024-08-30: qty 10

## 2024-08-30 NOTE — Progress Notes (Signed)
 Patient tolerated Venofer  infusion well. Explained recommendation of 30 min post monitoring. Patient refused to wait post monitoring. Educated on what signs to watch for & to call with any concerns. No questions, discharged. Stable

## 2024-09-01 ENCOUNTER — Inpatient Hospital Stay

## 2024-09-01 VITALS — BP 101/65 | HR 78 | Temp 97.9°F | Resp 18

## 2024-09-01 DIAGNOSIS — D509 Iron deficiency anemia, unspecified: Secondary | ICD-10-CM

## 2024-09-01 MED ORDER — IRON SUCROSE 20 MG/ML IV SOLN
200.0000 mg | INTRAVENOUS | Status: DC
  Administered 2024-09-01: 200 mg via INTRAVENOUS

## 2024-09-04 ENCOUNTER — Inpatient Hospital Stay

## 2024-09-05 ENCOUNTER — Inpatient Hospital Stay

## 2024-09-05 VITALS — BP 112/73 | HR 86 | Temp 97.2°F | Resp 18

## 2024-09-05 DIAGNOSIS — D509 Iron deficiency anemia, unspecified: Secondary | ICD-10-CM

## 2024-09-05 MED ORDER — IRON SUCROSE 20 MG/ML IV SOLN
200.0000 mg | INTRAVENOUS | Status: DC
  Administered 2024-09-05: 200 mg via INTRAVENOUS
  Filled 2024-09-05: qty 10

## 2024-09-19 ENCOUNTER — Ambulatory Visit: Admitting: Dermatology

## 2024-10-18 ENCOUNTER — Inpatient Hospital Stay

## 2024-10-18 ENCOUNTER — Inpatient Hospital Stay: Admitting: Oncology

## 2025-08-10 ENCOUNTER — Encounter: Admitting: Nurse Practitioner
